# Patient Record
Sex: Male | Born: 1935
Health system: Southern US, Community
[De-identification: ages and names within clinical notes are randomized; demographics above are authoritative.]

## PROBLEM LIST (undated history)

## (undated) DIAGNOSIS — C801 Malignant (primary) neoplasm, unspecified: Secondary | ICD-10-CM

## (undated) DIAGNOSIS — M1990 Unspecified osteoarthritis, unspecified site: Secondary | ICD-10-CM

## (undated) DIAGNOSIS — I10 Essential (primary) hypertension: Secondary | ICD-10-CM

## (undated) DIAGNOSIS — C439 Malignant melanoma of skin, unspecified: Secondary | ICD-10-CM

## (undated) DIAGNOSIS — E785 Hyperlipidemia, unspecified: Secondary | ICD-10-CM

## (undated) DIAGNOSIS — N189 Chronic kidney disease, unspecified: Secondary | ICD-10-CM

## (undated) HISTORY — DX: Hyperlipidemia, unspecified: E78.5

## (undated) HISTORY — PX: CATARACT EXTRACTION: SUR2

## (undated) HISTORY — PX: TONSILLECTOMY: SUR1361

## (undated) HISTORY — DX: Malignant melanoma of skin, unspecified: C43.9

## (undated) HISTORY — PX: URETHRAL STRICTURE DILATATION: SHX477

## (undated) HISTORY — DX: Malignant (primary) neoplasm, unspecified: C80.1

## (undated) HISTORY — PX: COLON SURGERY: SHX602

## (undated) HISTORY — PX: OTHER SURGICAL HISTORY: SHX169

## (undated) HISTORY — DX: Essential (primary) hypertension: I10

---

## 2008-01-02 ENCOUNTER — Emergency Department (HOSPITAL_COMMUNITY): Admission: EM | Admit: 2008-01-02 | Discharge: 2008-01-02 | Payer: Self-pay | Admitting: Emergency Medicine

## 2010-10-17 LAB — URINALYSIS, ROUTINE W REFLEX MICROSCOPIC
Bilirubin Urine: NEGATIVE
Glucose, UA: NEGATIVE mg/dL
Hgb urine dipstick: NEGATIVE
Ketones, ur: 15 mg/dL — AB
Nitrite: NEGATIVE
Protein, ur: NEGATIVE mg/dL
Specific Gravity, Urine: 1.02 (ref 1.005–1.030)
Urobilinogen, UA: 1 mg/dL (ref 0.0–1.0)
pH: 8 (ref 5.0–8.0)

## 2010-10-17 LAB — CBC
HCT: 46.8 % (ref 39.0–52.0)
Hemoglobin: 15.9 g/dL (ref 13.0–17.0)
MCHC: 34 g/dL (ref 30.0–36.0)
MCV: 94 fL (ref 78.0–100.0)
Platelets: 163 10*3/uL (ref 150–400)
RBC: 4.97 MIL/uL (ref 4.22–5.81)
RDW: 13.3 % (ref 11.5–15.5)
WBC: 19.9 10*3/uL — ABNORMAL HIGH (ref 4.0–10.5)

## 2010-10-17 LAB — DIFFERENTIAL
Basophils Absolute: 0 10*3/uL (ref 0.0–0.1)
Basophils Relative: 0 % (ref 0–1)
Eosinophils Absolute: 0 10*3/uL (ref 0.0–0.7)
Eosinophils Relative: 0 % (ref 0–5)
Lymphocytes Relative: 4 % — ABNORMAL LOW (ref 12–46)
Lymphs Abs: 0.7 10*3/uL (ref 0.7–4.0)
Monocytes Absolute: 1.1 10*3/uL — ABNORMAL HIGH (ref 0.1–1.0)
Monocytes Relative: 6 % (ref 3–12)
Neutro Abs: 18 10*3/uL — ABNORMAL HIGH (ref 1.7–7.7)
Neutrophils Relative %: 91 % — ABNORMAL HIGH (ref 43–77)

## 2010-10-17 LAB — POCT I-STAT, CHEM 8
BUN: 24 mg/dL — ABNORMAL HIGH (ref 6–23)
Calcium, Ion: 1.09 mmol/L — ABNORMAL LOW (ref 1.12–1.32)
Chloride: 101 mEq/L (ref 96–112)
Creatinine, Ser: 1.9 mg/dL — ABNORMAL HIGH (ref 0.4–1.5)
Glucose, Bld: 115 mg/dL — ABNORMAL HIGH (ref 70–99)
HCT: 48 % (ref 39.0–52.0)
Hemoglobin: 16.3 g/dL (ref 13.0–17.0)
Potassium: 3.6 mEq/L (ref 3.5–5.1)
Sodium: 133 mEq/L — ABNORMAL LOW (ref 135–145)
TCO2: 24 mmol/L (ref 0–100)

## 2010-10-17 LAB — POCT URINALYSIS DIP (DEVICE)
Bilirubin Urine: NEGATIVE
Glucose, UA: NEGATIVE mg/dL
Hgb urine dipstick: NEGATIVE
Ketones, ur: NEGATIVE mg/dL
Nitrite: NEGATIVE
Protein, ur: 100 mg/dL — AB
Specific Gravity, Urine: 1.015 (ref 1.005–1.030)
Urobilinogen, UA: 1 mg/dL (ref 0.0–1.0)
pH: 7 (ref 5.0–8.0)

## 2015-01-17 DIAGNOSIS — Z961 Presence of intraocular lens: Secondary | ICD-10-CM | POA: Diagnosis not present

## 2015-02-06 DIAGNOSIS — L570 Actinic keratosis: Secondary | ICD-10-CM | POA: Diagnosis not present

## 2015-02-06 DIAGNOSIS — Z85820 Personal history of malignant melanoma of skin: Secondary | ICD-10-CM | POA: Diagnosis not present

## 2015-02-06 DIAGNOSIS — L812 Freckles: Secondary | ICD-10-CM | POA: Diagnosis not present

## 2015-02-06 DIAGNOSIS — L821 Other seborrheic keratosis: Secondary | ICD-10-CM | POA: Diagnosis not present

## 2015-02-27 ENCOUNTER — Ambulatory Visit (INDEPENDENT_AMBULATORY_CARE_PROVIDER_SITE_OTHER): Payer: PPO | Admitting: Family

## 2015-02-27 ENCOUNTER — Encounter: Payer: Self-pay | Admitting: Family

## 2015-02-27 ENCOUNTER — Telehealth: Payer: Self-pay | Admitting: General Practice

## 2015-02-27 VITALS — BP 120/76 | HR 103 | Temp 97.5°F | Resp 18 | Ht 66.0 in | Wt 176.0 lb

## 2015-02-27 DIAGNOSIS — S8391XA Sprain of unspecified site of right knee, initial encounter: Secondary | ICD-10-CM

## 2015-02-27 NOTE — Progress Notes (Signed)
Subjective:    Patient ID: Jesus Kim, male    DOB: Nov 21, 1924, 82 y.o.   MRN: [id number]  Chief Complaint  Patient presents with  . Knee Pain    mild pain for 2-3 years. Comes and goes. In the last year, sitting for long times causes hard time to git out of seat. Monday started to fall and caught himself, may have twisted knee. Start using walker this morning to walk.     HPI:  Jesus Kim is a 82 y.o. male who  has a past medical history of Hypertension; Hyperlipidemia; Cancer ([place]); and Melanoma ([place]). and presents today for an office visit.   This is a new problem. Associated symptom of pain located in his right knee has been going on for about 2-3 years with an exacerbation that occurred about 2 days ago when he tripped over a cord and started to fall and caught himself possibly twisting his knee in the process. He has been using the walker to help him ambulate. Modifying factors include a brace, Advil, which it seemed to help. Exacerbated by sitting for long periods of time and now when weight bearing. No sounds or sensations heard or felt.    Allergies  Allergen Reactions  . Sulfa Antibiotics Rash     No outpatient prescriptions prior to visit.   No facility-administered medications prior to visit.     Past Medical History  Diagnosis Date  . Hypertension   . Hyperlipidemia   . Cancer [facility])     Colon  . Melanoma [facility])      Past Surgical History  Procedure Laterality Date  . Urethral stricture dilatation    . Cataract extraction    . Left thub surgery       History reviewed. No pertinent family history.   Social History   Social History  . Marital Status: Married    Spouse Name: N/A  . Number of Children: 3  . Years of Education: 14   Occupational History  . Not on file.   Social History Main Topics  . Smoking status: Former Research scientist (life sciences)  . Smokeless tobacco: Never Used  . Alcohol Use: Yes  . Drug Use: No  . Sexual Activity: Not on file   Other  Topics Concern  . Not on file   Social History Narrative  . No narrative on file    Review of Systems  Constitutional: Negative for fever and chills.  Musculoskeletal:       Positive for right knee pain.   Neurological: Negative for weakness and numbness.      Objective:    BP 120/76 mmHg  Pulse 103  Temp(Src) 97.5 F (36.4 C) (Oral)  Resp 18  Ht 5\' 6"  (1.676 m)  Wt 176 lb (79.833 kg)  BMI 28.42 kg/m2  SpO2 96% Nursing note and vital signs reviewed.  Physical Exam  Constitutional: He is oriented to person, place, and time. He appears well-developed and well-nourished. No distress.  Cardiovascular: Normal rate, regular rhythm, normal heart sounds and intact distal pulses.   Pulmonary/Chest: Effort normal and breath sounds normal.  Musculoskeletal:  Right knee - mild medial joint line inflammation with no deformity or discoloration. Palpable tenderness of medial joint line and posterior medial joint line. Range of motion is normal with 4+ strength. Distal pulses and sensation are intact and appropriate. Negative anterior/posterior drawer, negative valgus/varus. Discomfort with weight bearing.   Neurological: He is alert and oriented to person, place, and time.  Skin: Skin is warm and dry.  Psychiatric: He has a normal mood and affect. His behavior is normal. Judgment and thought content normal.       Assessment & Plan:   Problem List Items Addressed This Visit      Musculoskeletal and Integument   Right knee sprain - Primary    Symptoms and exam consistent with medial knee sprain although cannot rule out potential of meniscal tear. Treat conservative with OTC ibuprofen, ice, and home exercise therapy. Follow up in 2-3 weeks if symptoms worsen or fail to improve for possible imaging if needed.

## 2015-02-27 NOTE — Patient Instructions (Signed)
Thank you for choosing [facility].  Summary/Instructions:  Ice 2-3 times per day or as needed for 20 minutes at a time. Elevate your leg when seated. Exercises daily. Ibuprofen 200-600 mg up to 3 times daily. Pennsaid - pinkie sized dose up to 2x per day.   If your symptoms worsen or fail to improve, please contact our office for further instruction, or in case of emergency go directly to the emergency room at the closest medical facility.   Medial Collateral Knee Ligament Sprain With Phase I Rehab The medial collateral ligament (MCL) of the knee helps hold the knee joint in proper alignment and prevents the bones from shifting out of alignment (displacing) to the inside (medially). Injury to the knee may cause a tear in the MCL ligament (sprain). Sprains may heal without treatment, but this often results in a loose joint. Sprains are classified into three categories. Grade 1 sprains cause pain, but the tendon is not lengthened. Grade 2 sprains include a lengthened ligament, due to the ligament being stretched or partially ruptured. With grade 2 sprains, there is still function, although possibly decreased. Grade 3 sprains involve a complete tear of the tendon or muscle, and function is usually impaired. SYMPTOMS   Pain and tenderness on the inner side of the knee.  A "pop," tearing or pulling sensation at the time of injury.  Bruising (contusion) at the site of injury, within 48 hours of injury.  Knee stiffness.  Limping, often walking with the knee bent. CAUSES  An MCL sprain occurs when a force is placed on the ligament that is greater than it can handle. Common mechanisms of injury include:  Direct hit (trauma) to the outer side of the knee, especially if the foot is planted on the ground.  Forceful pivoting of the body and leg, while the foot is planted on the ground. RISK INCREASES WITH:  Contact sports (football, rugby).  Sports that require pivoting or cutting  (soccer).  Poor knee strength and flexibility.  Improper equipment use. PREVENTION  Warm up and stretch properly before activity.  Maintain physical fitness:  Strength, flexibility and endurance.  Cardiovascular fitness.  Wear properly fitted protective equipment (correct length of cleats for surface).  Functional braces may be effective in preventing injury. PROGNOSIS  MCL tears usually heal without the need for surgery. Sometimes however, surgery is required. RELATED COMPLICATIONS  Frequently recurring symptoms, such as the knee giving way, knee instability or knee swelling.  Injury to other structures in the knee joint:  Meniscal cartilage, resulting in locking and swelling of the knee.  Articular cartilage, resulting in knee arthritis.  Other ligaments of the knee.  Injury to nerves, resulting in numbness of the outer leg, foot or ankle and weakness or paralysis, with inability to raise the ankle or toes.  Knee stiffness. TREATMENT Treatment first involves the use of ice and medicine, to reduce pain and inflammation. The use of strengthening and stretching exercises may help reduce pain with activity. These exercises may be performed at home, but referral to a therapist is often advised. You may be advised to walk with crutches until you are able to walk without a limp. Your caregiver may provide you with a hinged knee brace to help regain a full range of motion, while also protecting the injured knee. For severe MCL injuries or injuries that involve other ligaments of the knee, surgery is often advised. MEDICATION  Do not take pain medicine for 7 days before surgery.  Only use  over-the-counter pain medicine as directed by your caregiver.  Only use prescription pain relievers as directed and only in needed amounts. HEAT AND COLD  Cold treatment (icing) should be applied for 10 to 15 minutes every 2 to 3 hours for inflammation and pain, and immediately after any  activity, that aggravates the symptoms. Use ice packs or an ice massage.  Heat treatment may be used before performing stretching and strengthening activities prescribed by your caregiver, physical therapist or athletic trainer. Use a heat pack or warm water soak. SEEK MEDICAL CARE IF:   Symptoms get worse or do not improve in 4 to 6 weeks, despite treatment.  New, unexplained symptoms develop. EXERCISES  PHASE I EXERCISES  RANGE OF MOTION (ROM) AND STRETCHING EXERCISES-Medial Collateral Knee Ligament Sprain Phase I These are some of the initial exercises that your physician, physical therapist or athletic trainer may have you perform to begin your rehabilitation. When you demonstrate gains in your flexibility and strength, your caregiver may progress you to Phase II exercises. As you perform these exercises, remember:  These initial exercises are intended to be gentle. They will help you restore motion without increasing any swelling.  Completing these exercises allows less painful movement and prepares you for the more aggressive strengthening exercises in Phase II.  An effective stretch should be held for at least 30 seconds.  A stretch should never be painful. You should only feel a gentle lengthening or release in the stretched tissue. RANGE OF MOTION-Knee Flexion, Active  Lie on your back with both knees straight. (If this causes back discomfort, bend your healthy knee, placing your foot flat on the floor.)  Slowly slide your heel back toward your buttocks until you feel a gentle stretch in the front of your knee or thigh.  Hold for __________ seconds. Slowly slide your heel back to the starting position. Repeat __________ times. Complete this exercise __________ times per day. STRETCH-Knee Flexion, Supine  Lie on the floor with your right / left heel and foot lightly touching the wall. (Place both feet on the wall if you do not use a door frame.)  Without using any effort,  allow gravity to slide your foot down the wall slowly until you feel a gentle stretch in the front of your right / left knee.  Hold this stretch for __________ seconds. Then return the leg to the starting position, using your health leg for help, if needed. Repeat __________ times. Complete this stretch __________ times per day. RANGE OF MOTION-Knee Flexion and Extension, Active-Assisted  Sit on the edge of a table or chair with your thighs firmly supported. It may be helpful to place a folded towel under the end of your right / left thigh.  Flexion (bending): Place the ankle of your healthy leg on top of the other ankle. Use your healthy leg to gently bend your right / left knee until you feel a mild tension across the top of your knee.  Hold for __________ seconds.  Extension (straightening): Switch your ankles so your right / left leg is on top. Use your healthy leg to straighten your right / left knee until you feel a mild tension on the backside of your knee.  Hold for __________ seconds. Repeat __________ times. Complete this exercise __________ times per day. STRETCH-Knee Extension Sitting  Sit with your right / left leg/heel propped on another chair, coffee table, or foot stool.  Allow your leg muscles to relax, letting gravity straighten out your knee.*  You should feel a stretch behind your right / left knee. Hold this position for __________ seconds. Repeat __________ times. Complete this stretch __________ times per day. *Your physician, physical therapist or athletic trainer may instruct you to place a __________ weight on your thigh, just above your kneecap, to deepen the stretch. STRENGTHENING EXERCISES-Medial Collateral Knee Ligament Sprain Phase I These exercises may help you when beginning to rehabilitate your injury. They may resolve your symptoms with or without further involvement from your physician, physical therapist or athletic trainer. While completing these  exercises, remember:   In order to return to more demanding activities, you will likely need to progress to more challenging exercises. Your physician, physical therapist or athletic trainer will advance your exercises when your tissues show adequate healing and your muscles demonstrate increased strength.  Muscles can gain both the endurance and the strength needed for everyday activities through controlled exercises.  Complete these exercises as instructed by your physician, physical therapist or athletic trainer. Increase the resistance and repetitions only as guided by your caregiver. STRENGTH-Quadriceps, Isometrics  Lie on your back with your right / left leg extended and your opposite knee bent.  Gradually tense the muscles in the front of your right / left thigh. You should see either your kneecap slide up toward your hip or an increased dimpling just above the knee. This motion will push the back of the knee down toward the floor, mat or bed on which you are lying.  Hold the muscle as tight as you can without increasing your pain for __________ seconds.  Relax the muscles slowly and completely in between each repetition. Repeat __________ times. Complete this exercise __________ times per day. STRENGTH-Quadriceps, Short Arcs  Lie on your back. Place a __________ inch towel roll under your knee so that the knee slightly bends.  Raise only your lower leg by tightening the muscles in the front of your thigh. Do not allow your thigh to rise.  Hold this position for __________ seconds. Repeat __________ times. Complete this exercise __________ times per day. OPTIONAL ANKLE WEIGHTS: Begin with ____________________, but DO NOT exceed ____________________. Increase in 1 pound/0.5 kilogram increments.  STRENGTH--Quadriceps, Straight Leg Raises Quality counts! Watch for signs that the quadriceps muscle is working, to be sure you are strengthening the correct muscles and not "cheating" by  substituting with healthier muscles.  Lay on your back with your right / left leg extended and your opposite knee bent.  Tense the muscles in the front of your right / left thigh. You should see either your knee cap slide up or increased dimpling just above the knee. Your thigh may even shake a bit.  Tighten these muscles even more and raise your leg 4 to 6 inches off the floor. Hold for __________ seconds.  Keeping these muscles tense, lower your leg.  Relax the muscles slowly and completely in between each repetition. Repeat __________ times. Complete this exercise __________ times per day. STRENGTH-Hamstring, Isometrics  Lie on your back on a firm surface.  Bend your right / left knee approximately __________ degrees.  Dig your heel into the surface as if you are trying to pull it toward your buttocks. Tighten the muscles in the back of your thighs to "dig" as hard as you can, without increasing any pain.  Hold this position for __________ seconds.  Release the tension gradually and allow your muscle to completely relax for __________ seconds in between each exercise. Repeat __________ times. Complete this exercise __________  times per day. STRENGTH-Hamstring, Curls  Lay on your stomach with your legs extended. (If you lay on a bed, your feet may hang over the edge.)  Tighten the muscles in the back of your thigh to bend your right / left knee up to 90 degrees. Keep your hips flat on the bed.  Hold this position for __________ seconds.  Slowly lower your leg back to the starting position. Repeat __________ times. Complete this exercise __________ times per day. OPTIONAL ANKLE WEIGHTS: Begin with ____________________, but DO NOT exceed ____________________. Increase in 1 pound/0.5 kilogram increments.    This information is not intended to replace advice given to you by your health care provider. Make sure you discuss any questions you have with your health care provider.     Document Released: 12/29/2004 Document Revised: 01/19/2014 Document Reviewed: 04/12/2008 Elsevier Interactive Patient Education [employer].

## 2015-02-27 NOTE — Progress Notes (Signed)
Pre visit review using our clinic review tool, if applicable. No additional management support is needed unless otherwise documented below in the visit note. 

## 2015-02-27 NOTE — Assessment & Plan Note (Signed)
Symptoms and exam consistent with medial knee sprain although cannot rule out potential of meniscal tear. Treat conservative with OTC ibuprofen, ice, and home exercise therapy. Follow up in 2-3 weeks if symptoms worsen or fail to improve for possible imaging if needed.

## 2015-02-27 NOTE — Telephone Encounter (Signed)
States daughter see's Dr. Tamala Julian.  States he has done something to his knee and can not walk.  Would like to know if Dr. Tamala Julian can work him in.

## 2015-02-27 NOTE — Telephone Encounter (Signed)
Spoke to pt, explained to him that dr Tamala Julian did not have an opening today but Marya Amsler our NP did. Pt stated he would be fine seeing him. Scheduled pt with Terri Piedra @ 130p.

## 2015-03-13 DIAGNOSIS — Z0000 Encounter for general adult medical examination without abnormal findings: Secondary | ICD-10-CM | POA: Diagnosis not present

## 2015-03-13 DIAGNOSIS — R350 Frequency of micturition: Secondary | ICD-10-CM | POA: Diagnosis not present

## 2015-03-13 DIAGNOSIS — R338 Other retention of urine: Secondary | ICD-10-CM | POA: Diagnosis not present

## 2015-03-13 DIAGNOSIS — N390 Urinary tract infection, site not specified: Secondary | ICD-10-CM | POA: Diagnosis not present

## 2015-03-29 ENCOUNTER — Other Ambulatory Visit: Payer: Self-pay | Admitting: Urology

## 2015-03-29 DIAGNOSIS — Z0000 Encounter for general adult medical examination without abnormal findings: Secondary | ICD-10-CM | POA: Diagnosis not present

## 2015-03-29 DIAGNOSIS — R3912 Poor urinary stream: Secondary | ICD-10-CM | POA: Diagnosis not present

## 2015-03-29 DIAGNOSIS — N3020 Other chronic cystitis without hematuria: Secondary | ICD-10-CM | POA: Diagnosis not present

## 2015-03-29 DIAGNOSIS — R338 Other retention of urine: Secondary | ICD-10-CM | POA: Diagnosis not present

## 2015-03-29 NOTE — Patient Instructions (Addendum)
STEVYN MADDEN  03/29/2015   Your procedure is scheduled on: 04/02/2015    Report to [facility] Main  Entrance take [facility]  elevators to 3rd floor to  [place] at  12noon  Call this number if you have problems the morning of surgery [phone]   Remember: ONLY 1 PERSON MAY GO WITH YOU TO SHORT STAY TO GET  READY MORNING OF YOUR SURGERY.  Do not eat food after midnite .  May have clear liquids from 12 midnite until 0730am morning of surgery then nothing by mouth.       Take these medicines the morning of surgery with A SIP OF WATER: none                                 You may not have any metal on your body including hair pins and              piercings  Do not wear jewelry,  lotions, powders or perfumes, deodorant                 Men may shave face and neck.   Do not bring valuables to the hospital. [place].  Contacts, dentures or bridgework may not be worn into surgery. .     Patients discharged the day of surgery will not be allowed to drive home.  Name and phone number of your driver:  Special Instructions: coughing and deep breathing exercises, leg exercises               Please read over the following fact sheets you were given: _____________________________________________________________________             [facility] - Preparing for Surgery Before surgery, you can play an important role.  Because skin is not sterile, your skin needs to be as free of germs as possible.  You can reduce the number of germs on your skin by washing with CHG (chlorahexidine gluconate) soap before surgery.  CHG is an antiseptic cleaner which kills germs and bonds with the skin to continue killing germs even after washing. Please DO NOT use if you have an allergy to CHG or antibacterial soaps.  If your skin becomes reddened/irritated stop using the CHG and inform your nurse when you arrive at Short Stay. Do not  shave (including legs and underarms) for at least 48 hours prior to the first CHG shower.  You may shave your face/neck. Please follow these instructions carefully:  1.  Shower with CHG Soap the night before surgery and the  morning of Surgery.  2.  If you choose to wash your hair, wash your hair first as usual with your  normal  shampoo.  3.  After you shampoo, rinse your hair and body thoroughly to remove the  shampoo.                           4.  Use CHG as you would any other liquid soap.  You can apply chg directly  to the skin and wash  Gently with a scrungie or clean washcloth.  5.  Apply the CHG Soap to your body ONLY FROM THE NECK DOWN.   Do not use on face/ open                           Wound or open sores. Avoid contact with eyes, ears mouth and genitals (private parts).                       Wash face,  Genitals (private parts) with your normal soap.             6.  Wash thoroughly, paying special attention to the area where your surgery  will be performed.  7.  Thoroughly rinse your body with warm water from the neck down.  8.  DO NOT shower/wash with your normal soap after using and rinsing off  the CHG Soap.                9.  Pat yourself dry with a clean towel.            10.  Wear clean pajamas.            11.  Place clean sheets on your bed the night of your first shower and do not  sleep with pets. Day of Surgery : Do not apply any lotions/deodorants the morning of surgery.  Please wear clean clothes to the hospital/surgery center.  FAILURE TO FOLLOW THESE INSTRUCTIONS MAY RESULT IN THE CANCELLATION OF YOUR SURGERY PATIENT SIGNATURE_________________________________  NURSE SIGNATURE__________________________________  ________________________________________________________________________    CLEAR LIQUID DIET   Foods Allowed                                                                     Foods Excluded  Coffee and tea, regular and decaf                              liquids that you cannot  Plain Jell-O in any flavor                                             see through such as: Fruit ices (not with fruit pulp)                                     milk, soups, orange juice  Iced Popsicles                                    All solid food Carbonated beverages, regular and diet                                    Cranberry, grape and apple juices Sports drinks like Gatorade Lightly seasoned clear broth or consume(fat free) Sugar, honey syrup  Sample Menu Breakfast                                Lunch                                     Supper Cranberry juice                    Beef broth                            Chicken broth Jell-O                                     Grape juice                           Apple juice Coffee or tea                        Jell-O                                      Popsicle                                                Coffee or tea                        Coffee or tea  _____________________________________________________________________

## 2015-04-01 ENCOUNTER — Encounter (HOSPITAL_COMMUNITY): Payer: Self-pay

## 2015-04-01 ENCOUNTER — Encounter (HOSPITAL_COMMUNITY)
Admission: RE | Admit: 2015-04-01 | Discharge: 2015-04-01 | Disposition: A | Discharge status: Home / Self Care | Payer: PPO | Source: Ambulatory Visit | Attending: Urology | Admitting: Urology

## 2015-04-01 DIAGNOSIS — R338 Other retention of urine: Secondary | ICD-10-CM | POA: Diagnosis not present

## 2015-04-01 DIAGNOSIS — I129 Hypertensive chronic kidney disease with stage 1 through stage 4 chronic kidney disease, or unspecified chronic kidney disease: Secondary | ICD-10-CM | POA: Diagnosis not present

## 2015-04-01 DIAGNOSIS — N189 Chronic kidney disease, unspecified: Secondary | ICD-10-CM | POA: Diagnosis not present

## 2015-04-01 DIAGNOSIS — Z79899 Other long term (current) drug therapy: Secondary | ICD-10-CM | POA: Diagnosis not present

## 2015-04-01 DIAGNOSIS — E7800 Pure hypercholesterolemia, unspecified: Secondary | ICD-10-CM | POA: Diagnosis not present

## 2015-04-01 DIAGNOSIS — N359 Urethral stricture, unspecified: Secondary | ICD-10-CM | POA: Diagnosis not present

## 2015-04-01 DIAGNOSIS — Z87891 Personal history of nicotine dependence: Secondary | ICD-10-CM | POA: Diagnosis not present

## 2015-04-01 DIAGNOSIS — N3020 Other chronic cystitis without hematuria: Secondary | ICD-10-CM | POA: Diagnosis not present

## 2015-04-01 HISTORY — DX: Unspecified osteoarthritis, unspecified site: M19.90

## 2015-04-01 LAB — BASIC METABOLIC PANEL
Anion gap: 10 (ref 5–15)
BUN: 33 mg/dL — ABNORMAL HIGH (ref 6–20)
CO2: 24 mmol/L (ref 22–32)
Calcium: 9.2 mg/dL (ref 8.9–10.3)
Chloride: 105 mmol/L (ref 101–111)
Creatinine, Ser: 2.13 mg/dL — ABNORMAL HIGH (ref 0.61–1.24)
GFR calc Af Amer: 30 mL/min — ABNORMAL LOW (ref >60)
GFR calc non Af Amer: 26 mL/min — ABNORMAL LOW (ref >60)
Glucose, Bld: 88 mg/dL (ref 65–99)
Potassium: 4.9 mmol/L (ref 3.5–5.1)
Sodium: 139 mmol/L (ref 135–145)

## 2015-04-01 LAB — CBC
HCT: 46.9 % (ref 39.0–52.0)
Hemoglobin: 15.4 g/dL (ref 13.0–17.0)
MCH: 31.9 pg (ref 26.0–34.0)
MCHC: 32.8 g/dL (ref 30.0–36.0)
MCV: 97.1 fL (ref 78.0–100.0)
Platelets: 230 10*3/uL (ref 150–400)
RBC: 4.83 MIL/uL (ref 4.22–5.81)
RDW: 12.6 % (ref 11.5–15.5)
WBC: 9.9 10*3/uL (ref 4.0–10.5)

## 2015-04-01 NOTE — Progress Notes (Signed)
04-01-15 - BMP lab results from preop visit on 04-01-15 faxed to Dr. Matilde Sprang via [facility]

## 2015-04-01 NOTE — Progress Notes (Signed)
02-27-15 - LOV - G. Calone, [place] (int.med.) - EPIC

## 2015-04-01 NOTE — H&P (Signed)
History of Present Illness Jesus Kim had a ___ complex cyst. X-ray issues have been discussed. I spoke to radiology and Dr Jobe Igo recommended an MRI without contrast. It is better than a CT Scan without contrast, though often not definitive. Obviously following cysts by sequential ultrasounds is another option. His flow and frequency are improved and stable. I have him on daily trimethoprim.    When I saw Jesus Kim last time in August, he had stricture disease treated in [place] and had a stable flow on daily trimethoprim. In the past, he has not wanted an MRI. His residuals range from approximately 94-159 mL. He was doing well on trimethoprim. He has chronic renal insufficiency.   He saw Diane on 03/13/2015 with worsening voiding. He was on finasteride with the trimethoprim. His culture was negative. He was given RAPAFLO. He did not want a Foley catheter, and his residual 03/13/2015 was 348 mL.  Frequency: Stable.   [id number] [Recorded 03/29/15 11:35 AM EST by [email or web address]]     Past Medical History Problems   1. History of Colon Cancer  2. History of hypercholesterolemia (Z86.39)  3. History of hypertension (Z86.79)  Surgical History Problems   1. History of Colon Surgery  2. History of Hand Surgery  3. History of Urethra Surgery  Current Meds  1. Acetaminophen PM TABS;  Therapy: (Recorded:01Mar2017) to Recorded  2. Finasteride 5 MG Oral Tablet; TAKE 1 TABLET DAILY AS DIRECTED;  Therapy: [id number] to (Evaluate:17Jul2016)  Requested for: 18Apr2016; Last  Rx:18Apr2016 Ordered  3. HydroCHLOROthiazide 12.5 MG Oral Tablet;  Therapy: [id number] to Recorded  4. Lisinopril 20 MG Oral Tablet;  Therapy: [id number] to Recorded  5. Rapaflo 8 MG Oral Capsule; TAKE 1 CAPSULE Daily;  Therapy: [id number] to (Evaluate:08Mar2017); Last Rx:01Mar2017 Ordered  6. Simvastatin 20 MG Oral Tablet;  Therapy: (Recorded:25Apr2012) to Recorded  7. Trimethoprim 100 MG Oral Tablet; take 1 tablet by mouth once  daily;  Therapy: [id number] to (Evaluate:27Jul2017)  Requested for: 01Aug2016; Last  Rx:01Aug2016 Ordered  Allergies Medication   1. Sulfa Drugs  Family History Problems   1. Family history of Death In The Family Father : Father   FATHER PASSED @ AGE 21     HEART  2. Family history of Death In The Family Mother : Mother   MOTHER PASSED @ AGE 28     COLON  3. Family history of Family Health Status Number Of Children   3 DAUGHTERS  4. Family history of Hypertension : Mother  Social History Problems   1. Alcohol Use (History)   2 DRINKS DAILY  2. Caffeine Use   2 DRINKS DAILY  3. History of tobacco use (Z87.891)   SMOKED 1/4 PACKS DAILY     SMOKED FOR 30 YEARS     QUIT SMOKING 30 YEARS AGO  4. Marital History - Currently Married  5. 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 Other retention of urine (R33.8)  2. Weak urinary stream (R39.12)  Plan  Chronic cystitis   1. PVR U/S; Status:Complete;   Done: [id number]  2. URINE CULTURE; Status:In Progress - Specimen/Data Collected;   Done: [id number] Health Maintenance   3. UA With REFLEX; [Do Not Release]; Status:Resulted - Requires Verification;   Done:  [id number] 11:04AM  Discussion/Summary   Jesus Shockley likely has had recurrence of his urethral stricture. I talked to him about cystoscopy, retrograde urethrogram, and balloon dilation.  We talked about balloon dilation in detail. Pros, cons, general surgical and anesthetic risks, and other options including watchful waiting were discussed. He understands that dilation is  generally successful in most cases but long-term success rates are low. We talked about the risk of persistent, de novo, or worsening incontinence and voiding dysfunction. Risks were described but not limited to the discussion of injury to neighboring structures and soft tissues. Bleeding, infection, pain, erectile dysfunction, and spraying of urination were discussed. The risk of neuropathy was discussed as well as the usual post-operative course.  There were 440 mL in his bladder.  Jesus Lien is having some burning, and it may just be related to the stricture itself, but I thought it was best, with the weekend and not emptying well, to start him on an antibiotic. I think this should be done quite quickly in terms of dilation.  I will send in a prescription of ciprofloxacin. Urine was sent for culture.  After a thorough review of the management options for the patient's condition the patient  elected to proceed with surgical therapy as noted above. We have discussed the potential benefits and risks of the procedure, side effects of the proposed treatment, the likelihood of the patient achieving the goals of the procedure, and any potential problems that might occur during the procedure or recuperation. Informed consent has been obtained.

## 2015-04-02 ENCOUNTER — Ambulatory Visit (HOSPITAL_COMMUNITY): Payer: PPO | Admitting: Certified Registered Nurse Anesthetist

## 2015-04-02 ENCOUNTER — Ambulatory Visit (HOSPITAL_COMMUNITY): Payer: PPO

## 2015-04-02 ENCOUNTER — Ambulatory Visit (HOSPITAL_COMMUNITY)
Admission: RE | Admit: 2015-04-02 | Discharge: 2015-04-02 | Disposition: A | Discharge status: Home / Self Care | Payer: PPO | Source: Ambulatory Visit | Attending: Urology | Admitting: Urology

## 2015-04-02 ENCOUNTER — Encounter (HOSPITAL_COMMUNITY)
Admission: RE | Disposition: A | Discharge status: Home / Self Care | Payer: Self-pay | Source: Ambulatory Visit | Attending: Urology

## 2015-04-02 ENCOUNTER — Encounter (HOSPITAL_COMMUNITY): Payer: Self-pay | Admitting: *Deleted

## 2015-04-02 DIAGNOSIS — I129 Hypertensive chronic kidney disease with stage 1 through stage 4 chronic kidney disease, or unspecified chronic kidney disease: Secondary | ICD-10-CM | POA: Insufficient documentation

## 2015-04-02 DIAGNOSIS — N189 Chronic kidney disease, unspecified: Secondary | ICD-10-CM | POA: Insufficient documentation

## 2015-04-02 DIAGNOSIS — N359 Urethral stricture, unspecified: Secondary | ICD-10-CM | POA: Insufficient documentation

## 2015-04-02 DIAGNOSIS — Z4682 Encounter for fitting and adjustment of non-vascular catheter: Secondary | ICD-10-CM | POA: Diagnosis not present

## 2015-04-02 DIAGNOSIS — N3020 Other chronic cystitis without hematuria: Secondary | ICD-10-CM | POA: Diagnosis not present

## 2015-04-02 DIAGNOSIS — Z87891 Personal history of nicotine dependence: Secondary | ICD-10-CM | POA: Insufficient documentation

## 2015-04-02 DIAGNOSIS — Z79899 Other long term (current) drug therapy: Secondary | ICD-10-CM | POA: Insufficient documentation

## 2015-04-02 DIAGNOSIS — E7800 Pure hypercholesterolemia, unspecified: Secondary | ICD-10-CM | POA: Insufficient documentation

## 2015-04-02 DIAGNOSIS — Z419 Encounter for procedure for purposes other than remedying health state, unspecified: Secondary | ICD-10-CM

## 2015-04-02 HISTORY — PX: CYSTOSCOPY WITH RETROGRADE PYELOGRAM, URETEROSCOPY AND STENT PLACEMENT: SHX5789

## 2015-04-02 HISTORY — PX: BALLOON DILATION: SHX5330

## 2015-04-02 SURGERY — CYSTOURETEROSCOPY, WITH RETROGRADE PYELOGRAM AND STENT INSERTION
Anesthesia: General

## 2015-04-02 MED ORDER — CIPROFLOXACIN IN D5W 400 MG/200ML IV SOLN
INTRAVENOUS | Status: AC
  Filled 2015-04-02: qty 200

## 2015-04-02 MED ORDER — CIPROFLOXACIN IN D5W 400 MG/200ML IV SOLN
400.0000 mg | INTRAVENOUS | Status: AC
  Administered 2015-04-02: 400 mg via INTRAVENOUS

## 2015-04-02 MED ORDER — ONDANSETRON HCL 4 MG/2ML IJ SOLN
INTRAMUSCULAR | Status: AC
  Filled 2015-04-02: qty 2

## 2015-04-02 MED ORDER — PROPOFOL 10 MG/ML IV BOLUS
INTRAVENOUS | Status: AC
  Filled 2015-04-02: qty 20

## 2015-04-02 MED ORDER — FENTANYL CITRATE (PF) 100 MCG/2ML IJ SOLN
INTRAMUSCULAR | Status: AC
  Filled 2015-04-02: qty 2

## 2015-04-02 MED ORDER — FENTANYL CITRATE (PF) 100 MCG/2ML IJ SOLN
INTRAMUSCULAR | Status: DC | PRN
  Administered 2015-04-02: 25 ug via INTRAVENOUS
  Administered 2015-04-02: 50 ug via INTRAVENOUS
  Administered 2015-04-02: 25 ug via INTRAVENOUS

## 2015-04-02 MED ORDER — ONDANSETRON HCL 4 MG/2ML IJ SOLN
INTRAMUSCULAR | Status: DC | PRN
  Administered 2015-04-02: 4 mg via INTRAVENOUS

## 2015-04-02 MED ORDER — HYDROCODONE-ACETAMINOPHEN 5-325 MG PO TABS: 1.0000 | ORAL_TABLET | Freq: Four times a day (QID) | ORAL | Status: DC | PRN

## 2015-04-02 MED ORDER — PROPOFOL 10 MG/ML IV BOLUS
INTRAVENOUS | Status: DC | PRN
  Administered 2015-04-02: 150 mg via INTRAVENOUS

## 2015-04-02 MED ORDER — LACTATED RINGERS IV SOLN
INTRAVENOUS | Status: DC
  Administered 2015-04-02: 1000 mL via INTRAVENOUS

## 2015-04-02 MED ORDER — PHENYLEPHRINE HCL 10 MG/ML IJ SOLN
INTRAMUSCULAR | Status: DC | PRN
  Administered 2015-04-02: 80 ug via INTRAVENOUS

## 2015-04-02 MED ORDER — EPHEDRINE SULFATE 50 MG/ML IJ SOLN
INTRAMUSCULAR | Status: DC | PRN
  Administered 2015-04-02: 10 mg via INTRAVENOUS

## 2015-04-02 MED ORDER — LIDOCAINE HCL (CARDIAC) 20 MG/ML IV SOLN
INTRAVENOUS | Status: AC
  Filled 2015-04-02: qty 5

## 2015-04-02 MED ORDER — LIDOCAINE HCL (CARDIAC) 20 MG/ML IV SOLN
INTRAVENOUS | Status: DC | PRN
  Administered 2015-04-02: 100 mg via INTRAVENOUS

## 2015-04-02 SURGICAL SUPPLY — 12 items
BAG URO CATCHER STRL LF (MISCELLANEOUS) ×2 IMPLANT
BALLN NEPHROSTOMY (BALLOONS) ×2
BALLOON NEPHROSTOMY (BALLOONS) IMPLANT
CATH FOLEY 2W COUNCIL 5CC 16FR (CATHETERS) ×1 IMPLANT
CATH INTERMIT  6FR 70CM (CATHETERS) ×2 IMPLANT
CLOTH BEACON ORANGE TIMEOUT ST (SAFETY) ×2 IMPLANT
GLOVE BIOGEL M STRL SZ7.5 (GLOVE) ×2 IMPLANT
GOWN STRL REUS W/TWL LRG LVL3 (GOWN DISPOSABLE) ×4 IMPLANT
GUIDEWIRE STR DUAL SENSOR (WIRE) ×2 IMPLANT
MANIFOLD NEPTUNE II (INSTRUMENTS) ×2 IMPLANT
PACK CYSTO (CUSTOM PROCEDURE TRAY) ×2 IMPLANT
TUBING CONNECTING 10 (TUBING) IMPLANT

## 2015-04-02 NOTE — Anesthesia Preprocedure Evaluation (Addendum)
Anesthesia Evaluation  Patient identified by MRN, date of birth, ID band Patient awake    Reviewed: Allergy & Precautions, NPO status , Patient's Chart, lab work & pertinent test results  Airway Mallampati: I  TM Distance: >3 FB Neck ROM: Full    Dental   Pulmonary former smoker,    Pulmonary exam normal        Cardiovascular hypertension, Pt. on medications Normal cardiovascular exam     Neuro/Psych    GI/Hepatic   Endo/Other    Renal/GU      Musculoskeletal   Abdominal   Peds  Hematology   Anesthesia Other Findings   Reproductive/Obstetrics                            Anesthesia Physical Anesthesia Plan  ASA: II  Anesthesia Plan: General   Post-op Pain Management:    Induction: Intravenous  Airway Management Planned: LMA  Additional Equipment:   Intra-op Plan:   Post-operative Plan: Extubation in OR  Informed Consent: I have reviewed the patients History and Physical, chart, labs and discussed the procedure including the risks, benefits and alternatives for the proposed anesthesia with the patient or authorized representative who has indicated his/her understanding and acceptance.     Plan Discussed with: CRNA and Surgeon  Anesthesia Plan Comments:        Anesthesia Quick Evaluation

## 2015-04-02 NOTE — Op Note (Signed)
Preoperative diagnosis: Urethral stricture Postoperative diagnosis: Urethral stricture Surgery: Cystoscopy and retrograde urethrogram and balloon dilation of stricture Surgeon: Dr. Nicki Reaper Shavonta Gossen  The patient has the above diagnoses and consented above procedure. Extra care was taken with leg positioning  Preoperative antibiotics were given.  Utilizing cut IV tubing I did a retrograde urethrogram of the penis on oblique stretch the patient's left in the AP view. Approximate 10 mL of contrast was utilized. Urethra looked almost normal caliber though it didn't reduce throughout. Dye did reach the bladder.  I then cystoscoped the patient had a 4 [place] stricture 1 inch proximal to the urethral meatus.  Under fluoroscopic and cystoscopic guidance a sensor wire was passed curling in the bladder. Under fluoroscopic guidance the balloon dilation catheter well-prepared was delivered just distal to the membranous urethra. It was filled with 18 atm of pressure and held for 5 minutes. It was emptied and removed.  A 17 French cystoscope was utilized. He had diffuse stricture disease throughout the bulbar and membranous urethra starting 1 cm distal to the sphincter. The caliber was probably 22 [place] in size. The obstructing stricture I believe was the distal one already mentioned.  He had bilobar enlargement of the prostatic urethra. He had a little bit of a ski jump affect at the bladder neck. He had a long prostatic urethra. The bladder mucosa and trigone were normal.  Over the wire a 16 French [place] catheter was easily inserted. I'll leave it in for approximately 3 days.

## 2015-04-02 NOTE — Interval H&P Note (Signed)
History and Physical Interval Note:  04/02/2015 12:09 PM  Jesus Kim  has presented today for surgery, with the diagnosis of URETHRAL STRICTURE  The various methods of treatment have been discussed with the patient and family. After consideration of risks, benefits and other options for treatment, the patient has consented to  Procedure(s): CYSTO WITH RETROGRADE  (N/A) BALLOON DILATION (N/A) as a surgical intervention .  The patient's history has been reviewed, patient examined, no change in status, stable for surgery.  I have reviewed the patient's chart and labs.  Questions were answered to the patient's satisfaction.     Turner Baillie A

## 2015-04-02 NOTE — Transfer of Care (Signed)
Immediate Anesthesia Transfer of Care Note  Patient: Jesus Kim  Procedure(s) Performed: Procedure(s): CYSTO WITH RETROGRADE  (N/A) BALLOON DILATION (N/A)  Patient Location: PACU  Anesthesia Type:General  Level of Consciousness:  sedated, patient cooperative and responds to stimulation  Airway & Oxygen Therapy:Patient Spontanous Breathing and Patient connected to face mask oxgen  Post-op Assessment:  Report given to PACU RN and Post -op Vital signs reviewed and stable  Post vital signs:  Reviewed and stable  Last Vitals:  Filed Vitals:   04/02/15 1100  BP: 130/70  Pulse: 97  Temp: 36.7 C  Resp: 18    Complications: No apparent anesthesia complications

## 2015-04-02 NOTE — Anesthesia Postprocedure Evaluation (Signed)
Anesthesia Post Note  Patient: Jesus Kim  Procedure(s) Performed: Procedure(s) (LRB): CYSTO WITH RETROGRADE  (N/A) BALLOON DILATION (N/A)  Patient location during evaluation: PACU Anesthesia Type: General Level of consciousness: awake and alert Pain management: pain level controlled Vital Signs Assessment: post-procedure vital signs reviewed and stable Respiratory status: spontaneous breathing, nonlabored ventilation, respiratory function stable and patient connected to nasal cannula oxygen Cardiovascular status: blood pressure returned to baseline and stable Postop Assessment: no signs of nausea or vomiting Anesthetic complications: no    Last Vitals:  Filed Vitals:   04/02/15 1700 04/02/15 1714  BP: 104/63 117/56  Pulse: 85 82  Temp: 36.4 C 36.3 C  Resp: 20 16    Last Pain:  Filed Vitals:   04/02/15 1719  PainSc: 1                  Camille Dragan DAVID

## 2015-04-02 NOTE — Discharge Instructions (Signed)
Foley Catheter Care, Adult    A Foley catheter is a soft, flexible tube. This tube is placed into your bladder to drain pee (urine). If you go home with this catheter in place, follow the instructions below.  TAKING CARE OF THE CATHETER  1. Wash your hands with soap and water.  2. Put soap and water on a clean washcloth.  ¨ Clean the skin where the tube goes into your body.  § Clean away from the tube site.  § Never wipe toward the tube.  § Clean the area using a circular motion.  ¨ Remove all the soap. Pat the area dry with a clean towel. For males, reposition the skin that covers the end of the penis (foreskin).  3. Attach the tube to your leg with tape or a leg strap. Do not stretch the tube tight. If you are using tape, remove any stickiness left behind by past tape you used.  4. Keep the drainage bag below your hips. Keep it off the floor.  5. Check your tube during the day. Make sure it is working and draining. Make sure the tube does not curl, twist, or bend.  6. Do not pull on the tube or try to take it out.  TAKING CARE OF THE DRAINAGE BAGS    You will have a large overnight drainage bag and a small leg bag. You may wear the overnight bag any time. Never wear the small bag at night. Follow the directions below.  Emptying the Drainage Bag  Empty your drainage bag when it is ?-½ full or at least 2-3 times a day.  1. Wash your hands with soap and water.  2. Keep the drainage bag below your hips.  3. Hold the dirty bag over the toilet or clean container.  4. Open the pour spout at the bottom of the bag. Empty the pee into the toilet or container. Do not let the pour spout touch anything.  5. Clean the pour spout with a gauze pad or cotton ball that has rubbing alcohol on it.  6. Close the pour spout.  7. Attach the bag to your leg with tape or a leg strap.  8. Wash your hands well.  Changing the Drainage Bag  Change your bag once a month or sooner if it starts to smell or look dirty.   1. Wash your hands with  soap and water.  2. Pinch the rubber tube so that pee does not spill out.  3. Disconnect the catheter tube from the drainage tube at the connection valve. Do not let the tubes touch anything.  4. Clean the end of the catheter tube with an alcohol wipe. Clean the end of a the drainage tube with a different alcohol wipe.  5. Connect the catheter tube to the drainage tube of the clean drainage bag.  6. Attach the new bag to the leg with tape or a leg strap. Avoid attaching the new bag too tightly.  7. Wash your hands well.  Cleaning the Drainage Bag  1. Wash your hands with soap and water.  2. Wash the bag in warm, soapy water.  3. Rinse the bag with warm water.  4. Fill the bag with a mixture of white vinegar and water (1 cup vinegar to 1 quart warm water [.2 liter vinegar to 1 liter warm water]). Close the bag and soak it for 30 minutes in the solution.  5. Rinse the bag with warm water.  6.   Hang the bag to dry with the pour spout open and hanging downward.  7. Store the clean bag (once it is dry) in a clean plastic bag.  8. Wash your hands well.  PREVENT INFECTION  · Wash your hands before and after touching your tube.  · Take showers every day. Wash the skin where the tube enters your body. Do not take baths. Replace wet leg straps with dry ones, if this applies.  · Do not use powders, sprays, or lotions on the genital area. Only use creams, lotions, or ointments as told by your doctor.  · For females, wipe from front to back after going to the bathroom.  · Drink enough fluids to keep your pee clear or pale yellow unless you are told not to have too much fluid (fluid restriction).  · Do not let the drainage bag or tubing touch or lie on the floor.  · Wear cotton underwear to keep the area dry.  GET HELP IF:  · Your pee is cloudy or smells unusually bad.  · Your tube becomes clogged.  · You are not draining pee into the bag or your bladder feels full.  · Your tube starts to leak.  GET HELP RIGHT AWAY IF:  · You have  pain, puffiness (swelling), redness, or yellowish-white fluid (pus) where the tube enters the body.  · You have pain in the belly (abdomen), legs, lower back, or bladder.  · You have a fever.  · You see blood fill the tube, or your pee is pink or red.  · You feel sick to your stomach (nauseous), throw up (vomit), or have chills.  · Your tube gets pulled out.  MAKE SURE YOU:   · Understand these instructions.  · Will watch your condition.  · Will get help right away if you are not doing well or get worse.     This information is not intended to replace advice given to you by your health care provider. Make sure you discuss any questions you have with your health care provider.     Document Released: 04/25/2012 Document Revised: 01/19/2014 Document Reviewed: 04/25/2012  [employer]vier Interactive Patient Education ©2016 [employer]   

## 2015-04-02 NOTE — Progress Notes (Signed)
Ambulated to BR with minimal assist to demonstrate how to empty foley in toilet. Pt is dribbling small amouts of blood from penis. Assisted back to bed no active bleeding noted and urine is a very light pink. VSS and patient is warm and dry color pink Dr Matilde Sprang was notified of this and this was not unexpected. Pt is eager to go home. Peripad given to patient with instructions that office would call him tomorrow and he could call if he has concerns

## 2015-04-02 NOTE — Anesthesia Procedure Notes (Signed)
Procedure Name: LMA Insertion Date/Time: 04/02/2015 3:39 PM Performed by: Maxwell Caul Pre-anesthesia Checklist: Patient identified, Emergency Drugs available, Suction available, Patient being monitored and Timeout performed Patient Re-evaluated:Patient Re-evaluated prior to inductionOxygen Delivery Method: Circle system utilized Preoxygenation: Pre-oxygenation with 100% oxygen Intubation Type: IV induction LMA: LMA inserted LMA Size: 5.0 Number of attempts: 1 Placement Confirmation: positive ETCO2 and breath sounds checked- equal and bilateral Tube secured with: Tape Dental Injury: Teeth and Oropharynx as per pre-operative assessment

## 2015-04-02 NOTE — Progress Notes (Signed)
Stood at bedside to dress. No further dripping of blood from penis. Foley attached to leg strap and urine is only very slight pink.

## 2015-04-03 ENCOUNTER — Encounter (HOSPITAL_COMMUNITY): Payer: Self-pay | Admitting: Urology

## 2015-04-05 DIAGNOSIS — R338 Other retention of urine: Secondary | ICD-10-CM | POA: Diagnosis not present

## 2015-06-24 DIAGNOSIS — R7301 Impaired fasting glucose: Secondary | ICD-10-CM | POA: Diagnosis not present

## 2015-06-24 DIAGNOSIS — I10 Essential (primary) hypertension: Secondary | ICD-10-CM | POA: Diagnosis not present

## 2015-06-24 DIAGNOSIS — N183 Chronic kidney disease, stage 3 (moderate): Secondary | ICD-10-CM | POA: Diagnosis not present

## 2015-08-15 DIAGNOSIS — N3020 Other chronic cystitis without hematuria: Secondary | ICD-10-CM | POA: Diagnosis not present

## 2015-10-02 DIAGNOSIS — N359 Urethral stricture, unspecified: Secondary | ICD-10-CM | POA: Diagnosis not present

## 2015-10-02 DIAGNOSIS — N183 Chronic kidney disease, stage 3 (moderate): Secondary | ICD-10-CM | POA: Diagnosis not present

## 2015-10-23 DIAGNOSIS — Z23 Encounter for immunization: Secondary | ICD-10-CM | POA: Diagnosis not present

## 2015-10-23 DIAGNOSIS — Z961 Presence of intraocular lens: Secondary | ICD-10-CM | POA: Diagnosis not present

## 2015-10-23 DIAGNOSIS — H353122 Nonexudative age-related macular degeneration, left eye, intermediate dry stage: Secondary | ICD-10-CM | POA: Diagnosis not present

## 2015-10-23 DIAGNOSIS — H353112 Nonexudative age-related macular degeneration, right eye, intermediate dry stage: Secondary | ICD-10-CM | POA: Diagnosis not present

## 2015-10-23 DIAGNOSIS — H524 Presbyopia: Secondary | ICD-10-CM | POA: Diagnosis not present

## 2015-10-24 DIAGNOSIS — H353132 Nonexudative age-related macular degeneration, bilateral, intermediate dry stage: Secondary | ICD-10-CM | POA: Diagnosis not present

## 2015-12-13 DIAGNOSIS — B9789 Other viral agents as the cause of diseases classified elsewhere: Secondary | ICD-10-CM | POA: Diagnosis not present

## 2015-12-13 DIAGNOSIS — J069 Acute upper respiratory infection, unspecified: Secondary | ICD-10-CM | POA: Diagnosis not present

## 2015-12-16 DIAGNOSIS — J209 Acute bronchitis, unspecified: Secondary | ICD-10-CM | POA: Diagnosis not present

## 2015-12-25 DIAGNOSIS — N183 Chronic kidney disease, stage 3 (moderate): Secondary | ICD-10-CM | POA: Diagnosis not present

## 2015-12-25 DIAGNOSIS — E785 Hyperlipidemia, unspecified: Secondary | ICD-10-CM | POA: Diagnosis not present

## 2015-12-25 DIAGNOSIS — I10 Essential (primary) hypertension: Secondary | ICD-10-CM | POA: Diagnosis not present

## 2015-12-25 DIAGNOSIS — R739 Hyperglycemia, unspecified: Secondary | ICD-10-CM | POA: Diagnosis not present

## 2016-01-05 DIAGNOSIS — R05 Cough: Secondary | ICD-10-CM | POA: Diagnosis not present

## 2016-02-05 DIAGNOSIS — C44319 Basal cell carcinoma of skin of other parts of face: Secondary | ICD-10-CM | POA: Diagnosis not present

## 2016-02-05 DIAGNOSIS — D485 Neoplasm of uncertain behavior of skin: Secondary | ICD-10-CM | POA: Diagnosis not present

## 2016-02-05 DIAGNOSIS — D235 Other benign neoplasm of skin of trunk: Secondary | ICD-10-CM | POA: Diagnosis not present

## 2016-02-05 DIAGNOSIS — L570 Actinic keratosis: Secondary | ICD-10-CM | POA: Diagnosis not present

## 2016-02-05 DIAGNOSIS — L814 Other melanin hyperpigmentation: Secondary | ICD-10-CM | POA: Diagnosis not present

## 2016-02-05 DIAGNOSIS — L821 Other seborrheic keratosis: Secondary | ICD-10-CM | POA: Diagnosis not present

## 2016-02-05 DIAGNOSIS — D2271 Melanocytic nevi of right lower limb, including hip: Secondary | ICD-10-CM | POA: Diagnosis not present

## 2016-02-05 DIAGNOSIS — Z85820 Personal history of malignant melanoma of skin: Secondary | ICD-10-CM | POA: Diagnosis not present

## 2016-02-19 DIAGNOSIS — Z85828 Personal history of other malignant neoplasm of skin: Secondary | ICD-10-CM | POA: Diagnosis not present

## 2016-02-19 DIAGNOSIS — L905 Scar conditions and fibrosis of skin: Secondary | ICD-10-CM | POA: Diagnosis not present

## 2016-02-19 DIAGNOSIS — L988 Other specified disorders of the skin and subcutaneous tissue: Secondary | ICD-10-CM | POA: Diagnosis not present

## 2016-02-19 DIAGNOSIS — D485 Neoplasm of uncertain behavior of skin: Secondary | ICD-10-CM | POA: Diagnosis not present

## 2016-02-26 DIAGNOSIS — J111 Influenza due to unidentified influenza virus with other respiratory manifestations: Secondary | ICD-10-CM | POA: Diagnosis not present

## 2016-02-26 DIAGNOSIS — R509 Fever, unspecified: Secondary | ICD-10-CM | POA: Diagnosis not present

## 2016-04-01 DIAGNOSIS — L905 Scar conditions and fibrosis of skin: Secondary | ICD-10-CM | POA: Diagnosis not present

## 2016-04-01 DIAGNOSIS — Z85828 Personal history of other malignant neoplasm of skin: Secondary | ICD-10-CM | POA: Diagnosis not present

## 2016-04-22 DIAGNOSIS — H353131 Nonexudative age-related macular degeneration, bilateral, early dry stage: Secondary | ICD-10-CM | POA: Diagnosis not present

## 2016-05-28 DIAGNOSIS — J069 Acute upper respiratory infection, unspecified: Secondary | ICD-10-CM | POA: Diagnosis not present

## 2016-05-28 DIAGNOSIS — R110 Nausea: Secondary | ICD-10-CM | POA: Diagnosis not present

## 2016-06-24 DIAGNOSIS — E785 Hyperlipidemia, unspecified: Secondary | ICD-10-CM | POA: Diagnosis not present

## 2016-06-24 DIAGNOSIS — N400 Enlarged prostate without lower urinary tract symptoms: Secondary | ICD-10-CM | POA: Diagnosis not present

## 2016-06-24 DIAGNOSIS — I10 Essential (primary) hypertension: Secondary | ICD-10-CM | POA: Diagnosis not present

## 2016-06-24 DIAGNOSIS — R7303 Prediabetes: Secondary | ICD-10-CM | POA: Diagnosis not present

## 2016-06-24 DIAGNOSIS — N189 Chronic kidney disease, unspecified: Secondary | ICD-10-CM | POA: Diagnosis not present

## 2016-07-01 DIAGNOSIS — N359 Urethral stricture, unspecified: Secondary | ICD-10-CM | POA: Diagnosis not present

## 2016-07-01 DIAGNOSIS — N183 Chronic kidney disease, stage 3 (moderate): Secondary | ICD-10-CM | POA: Diagnosis not present

## 2016-07-16 DIAGNOSIS — N183 Chronic kidney disease, stage 3 (moderate): Secondary | ICD-10-CM | POA: Diagnosis not present

## 2016-07-22 DIAGNOSIS — N3020 Other chronic cystitis without hematuria: Secondary | ICD-10-CM | POA: Diagnosis not present

## 2016-07-22 DIAGNOSIS — R350 Frequency of micturition: Secondary | ICD-10-CM | POA: Diagnosis not present

## 2016-07-30 DIAGNOSIS — Z85820 Personal history of malignant melanoma of skin: Secondary | ICD-10-CM | POA: Diagnosis not present

## 2016-07-30 DIAGNOSIS — Z85828 Personal history of other malignant neoplasm of skin: Secondary | ICD-10-CM | POA: Diagnosis not present

## 2016-07-30 DIAGNOSIS — L570 Actinic keratosis: Secondary | ICD-10-CM | POA: Diagnosis not present

## 2016-07-30 DIAGNOSIS — L905 Scar conditions and fibrosis of skin: Secondary | ICD-10-CM | POA: Diagnosis not present

## 2016-07-30 DIAGNOSIS — C44319 Basal cell carcinoma of skin of other parts of face: Secondary | ICD-10-CM | POA: Diagnosis not present

## 2016-09-02 DIAGNOSIS — R351 Nocturia: Secondary | ICD-10-CM | POA: Diagnosis not present

## 2016-09-02 DIAGNOSIS — R350 Frequency of micturition: Secondary | ICD-10-CM | POA: Diagnosis not present

## 2016-09-21 DIAGNOSIS — L905 Scar conditions and fibrosis of skin: Secondary | ICD-10-CM | POA: Diagnosis not present

## 2016-09-21 DIAGNOSIS — Z85828 Personal history of other malignant neoplasm of skin: Secondary | ICD-10-CM | POA: Diagnosis not present

## 2016-10-07 DIAGNOSIS — Z6827 Body mass index (BMI) 27.0-27.9, adult: Secondary | ICD-10-CM | POA: Diagnosis not present

## 2016-10-07 DIAGNOSIS — N3289 Other specified disorders of bladder: Secondary | ICD-10-CM | POA: Diagnosis not present

## 2016-10-07 DIAGNOSIS — N184 Chronic kidney disease, stage 4 (severe): Secondary | ICD-10-CM | POA: Diagnosis not present

## 2016-10-07 DIAGNOSIS — Z23 Encounter for immunization: Secondary | ICD-10-CM | POA: Diagnosis not present

## 2016-10-07 DIAGNOSIS — N359 Urethral stricture, unspecified: Secondary | ICD-10-CM | POA: Diagnosis not present

## 2016-10-09 DIAGNOSIS — N184 Chronic kidney disease, stage 4 (severe): Secondary | ICD-10-CM | POA: Diagnosis not present

## 2016-10-28 DIAGNOSIS — H353131 Nonexudative age-related macular degeneration, bilateral, early dry stage: Secondary | ICD-10-CM | POA: Diagnosis not present

## 2016-10-28 DIAGNOSIS — H524 Presbyopia: Secondary | ICD-10-CM | POA: Diagnosis not present

## 2016-10-28 DIAGNOSIS — Z961 Presence of intraocular lens: Secondary | ICD-10-CM | POA: Diagnosis not present

## 2016-12-16 ENCOUNTER — Other Ambulatory Visit: Payer: Self-pay | Admitting: Family Medicine

## 2016-12-16 ENCOUNTER — Ambulatory Visit
Admission: RE | Admit: 2016-12-16 | Discharge: 2016-12-16 | Disposition: A | Discharge status: Home / Self Care | Payer: PPO | Source: Ambulatory Visit | Attending: Family Medicine | Admitting: Family Medicine

## 2016-12-16 DIAGNOSIS — I10 Essential (primary) hypertension: Secondary | ICD-10-CM | POA: Diagnosis not present

## 2016-12-16 DIAGNOSIS — R059 Cough, unspecified: Secondary | ICD-10-CM

## 2016-12-16 DIAGNOSIS — N189 Chronic kidney disease, unspecified: Secondary | ICD-10-CM | POA: Diagnosis not present

## 2016-12-16 DIAGNOSIS — R05 Cough: Secondary | ICD-10-CM

## 2016-12-16 DIAGNOSIS — R7303 Prediabetes: Secondary | ICD-10-CM | POA: Diagnosis not present

## 2016-12-16 DIAGNOSIS — E785 Hyperlipidemia, unspecified: Secondary | ICD-10-CM | POA: Diagnosis not present

## 2017-01-13 DIAGNOSIS — D649 Anemia, unspecified: Secondary | ICD-10-CM | POA: Diagnosis not present

## 2017-03-01 ENCOUNTER — Emergency Department (HOSPITAL_COMMUNITY)
Admission: EM | Admit: 2017-03-01 | Discharge: 2017-03-01 | Disposition: A | Discharge status: Home / Self Care | Payer: PPO | Attending: Emergency Medicine | Admitting: Emergency Medicine

## 2017-03-01 ENCOUNTER — Emergency Department (HOSPITAL_COMMUNITY): Payer: PPO

## 2017-03-01 ENCOUNTER — Encounter (HOSPITAL_COMMUNITY): Payer: Self-pay | Admitting: Emergency Medicine

## 2017-03-01 DIAGNOSIS — N189 Chronic kidney disease, unspecified: Secondary | ICD-10-CM

## 2017-03-01 DIAGNOSIS — N50811 Right testicular pain: Secondary | ICD-10-CM | POA: Diagnosis present

## 2017-03-01 DIAGNOSIS — Z85038 Personal history of other malignant neoplasm of large intestine: Secondary | ICD-10-CM | POA: Diagnosis not present

## 2017-03-01 DIAGNOSIS — L02214 Cutaneous abscess of groin: Secondary | ICD-10-CM | POA: Diagnosis not present

## 2017-03-01 DIAGNOSIS — N451 Epididymitis: Secondary | ICD-10-CM

## 2017-03-01 DIAGNOSIS — I861 Scrotal varices: Secondary | ICD-10-CM | POA: Diagnosis not present

## 2017-03-01 DIAGNOSIS — I129 Hypertensive chronic kidney disease with stage 1 through stage 4 chronic kidney disease, or unspecified chronic kidney disease: Secondary | ICD-10-CM | POA: Diagnosis not present

## 2017-03-01 DIAGNOSIS — I120 Hypertensive chronic kidney disease with stage 5 chronic kidney disease or end stage renal disease: Secondary | ICD-10-CM | POA: Diagnosis not present

## 2017-03-01 DIAGNOSIS — N50819 Testicular pain, unspecified: Secondary | ICD-10-CM

## 2017-03-01 DIAGNOSIS — Z79899 Other long term (current) drug therapy: Secondary | ICD-10-CM | POA: Diagnosis not present

## 2017-03-01 DIAGNOSIS — Z87891 Personal history of nicotine dependence: Secondary | ICD-10-CM | POA: Diagnosis not present

## 2017-03-01 DIAGNOSIS — N3001 Acute cystitis with hematuria: Secondary | ICD-10-CM | POA: Insufficient documentation

## 2017-03-01 LAB — I-STAT CG4 LACTIC ACID, ED
Lactic Acid, Venous: 1.65 mmol/L (ref 0.5–1.9)
Lactic Acid, Venous: 1.64 mmol/L (ref 0.5–1.9)

## 2017-03-01 LAB — COMPREHENSIVE METABOLIC PANEL
ALT: 86 U/L — ABNORMAL HIGH (ref 17–63)
AST: 57 U/L — ABNORMAL HIGH (ref 15–41)
Albumin: 3.7 g/dL (ref 3.5–5.0)
Alkaline Phosphatase: 59 U/L (ref 38–126)
Anion gap: 15 (ref 5–15)
BUN: 33 mg/dL — ABNORMAL HIGH (ref 6–20)
CO2: 19 mmol/L — ABNORMAL LOW (ref 22–32)
Calcium: 9.1 mg/dL (ref 8.9–10.3)
Chloride: 103 mmol/L (ref 101–111)
Creatinine, Ser: 2.59 mg/dL — ABNORMAL HIGH (ref 0.61–1.24)
GFR calc Af Amer: 23 mL/min — ABNORMAL LOW (ref >60)
GFR calc non Af Amer: 20 mL/min — ABNORMAL LOW (ref >60)
Glucose, Bld: 109 mg/dL — ABNORMAL HIGH (ref 65–99)
Potassium: 4.1 mmol/L (ref 3.5–5.1)
Sodium: 137 mmol/L (ref 135–145)
Total Bilirubin: 0.4 mg/dL (ref 0.3–1.2)
Total Protein: 8.1 g/dL (ref 6.5–8.1)

## 2017-03-01 LAB — CBC WITH DIFFERENTIAL/PLATELET
Basophils Absolute: 0 10*3/uL (ref 0.0–0.1)
Basophils Relative: 0 %
Eosinophils Absolute: 0.2 10*3/uL (ref 0.0–0.7)
Eosinophils Relative: 1 %
HCT: 38.9 % — ABNORMAL LOW (ref 39.0–52.0)
Hemoglobin: 13 g/dL (ref 13.0–17.0)
Lymphocytes Relative: 16 %
Lymphs Abs: 2.5 10*3/uL (ref 0.7–4.0)
MCH: 32.3 pg (ref 26.0–34.0)
MCHC: 33.4 g/dL (ref 30.0–36.0)
MCV: 96.8 fL (ref 78.0–100.0)
Monocytes Absolute: 1.1 10*3/uL — ABNORMAL HIGH (ref 0.1–1.0)
Monocytes Relative: 7 %
Neutro Abs: 12.3 10*3/uL — ABNORMAL HIGH (ref 1.7–7.7)
Neutrophils Relative %: 76 %
Platelets: 345 10*3/uL (ref 150–400)
RBC: 4.02 MIL/uL — ABNORMAL LOW (ref 4.22–5.81)
RDW: 13.7 % (ref 11.5–15.5)
WBC: 16.1 10*3/uL — ABNORMAL HIGH (ref 4.0–10.5)

## 2017-03-01 LAB — URINALYSIS, ROUTINE W REFLEX MICROSCOPIC
Bacteria, UA: NONE SEEN
Bilirubin Urine: NEGATIVE
Glucose, UA: NEGATIVE mg/dL
Ketones, ur: NEGATIVE mg/dL
Nitrite: NEGATIVE
Protein, ur: 30 mg/dL — AB
Specific Gravity, Urine: 1.011 (ref 1.005–1.030)
Squamous Epithelial / LPF: NONE SEEN
pH: 6 (ref 5.0–8.0)

## 2017-03-01 MED ORDER — CEPHALEXIN 500 MG PO CAPS: 500.0000 mg | ORAL_CAPSULE | Freq: Three times a day (TID) | ORAL | 0 refills | Status: DC

## 2017-03-01 MED ORDER — SODIUM CHLORIDE 0.9 % IV SOLN
1.0000 g | INTRAVENOUS | Status: DC
  Administered 2017-03-01: 1 g via INTRAVENOUS
  Filled 2017-03-01: qty 10

## 2017-03-01 MED ORDER — SODIUM CHLORIDE 0.9 % IV BOLUS (SEPSIS)
1000.0000 mL | Freq: Once | INTRAVENOUS | Status: AC
  Administered 2017-03-01: 1000 mL via INTRAVENOUS

## 2017-03-01 MED ORDER — SODIUM CHLORIDE 0.9 % IV SOLN
INTRAVENOUS | Status: DC
  Administered 2017-03-01: 20:00:00 via INTRAVENOUS

## 2017-03-01 NOTE — ED Triage Notes (Signed)
Patient sent from PCP for imaging of testicle. Reports swelling in right testicle for past 48 hours. Patient reports PCP stated he noticed little pus.

## 2017-03-01 NOTE — ED Notes (Signed)
ED Provider at bedside. 

## 2017-03-01 NOTE — Discharge Instructions (Signed)
Your creatinine today was slightly more elevated at 2.53.  Follow-up with Dr. Florene Glen

## 2017-03-01 NOTE — ED Provider Notes (Signed)
[facility] DEPT Provider Note   CSN: [id number] Arrival date & time: 03/01/17  1335     History   Chief Complaint Chief Complaint  Patient presents with  . Groin Swelling    right    HPI KHALEL ALMS is a 84 y.o. male.  84 year old male presents with 24-hour history of right testicular pain and swelling.  No fever or chills.  No vomiting or diarrhea.  Denies any dysuria or hematuria.  Does have history of chronic UTIs and takes antibiotics on a regular basis.  No flank pain.  Went to his doctor today who sent him here for a testicular ultrasound.  No treatment used prior to arrival      Past Medical History:  Diagnosis Date  . Arthritis   . Cancer [facility]    Colon  . Hyperlipidemia   . Hypertension   . Melanoma [facility])     Patient Active Problem List   Diagnosis Date Noted  . Right knee sprain 02/27/2015    Past Surgical History:  Procedure Laterality Date  . BALLOON DILATION N/A 04/02/2015   Procedure: BALLOON DILATION;  Surgeon: Bjorn Loser, MD;  Location: [facility]S;  Service: Urology;  Laterality: N/A;  . CATARACT EXTRACTION    . COLON SURGERY     1981 - colon cancer  . CYSTOSCOPY WITH RETROGRADE PYELOGRAM, URETEROSCOPY AND STENT PLACEMENT N/A 04/02/2015   Procedure: CYSTO WITH RETROGRADE ;  Surgeon: Bjorn Loser, MD;  Location: [facility];  Service: Urology;  Laterality: N/A;  . Left thub surgery    . TONSILLECTOMY    . URETHRAL STRICTURE DILATATION         Home Medications    Prior to Admission medications   Medication Sig Start Date End Date Taking? Authorizing Provider  acetaminophen (TYLENOL) 500 MG tablet Take 500-1,000 mg by mouth every 6 (six) hours as needed for moderate pain.   Yes [provider]  aspirin 81 MG tablet Take 81 mg by mouth daily.   Yes [provider]  diphenhydramine-acetaminophen (TYLENOL PM) 25-500 MG TABS tablet Take 1 tablet by mouth at bedtime.   Yes [provider]   finasteride (PROSCAR) 5 MG tablet Take 5 mg by mouth daily. Evening   Yes [provider]  lisinopril (PRINIVIL,ZESTRIL) 20 MG tablet Take 20 mg by mouth daily.    Yes [provider]  Multiple Vitamins-Minerals (PRESERVISION AREDS 2+MULTI VIT PO) Take 2 tablets by mouth daily.   Yes [provider]  simvastatin (ZOCOR) 20 MG tablet Take 20 mg by mouth daily.   Yes [provider]  trimethoprim (TRIMPEX) 100 MG tablet Take 100 mg by mouth daily.   Yes [provider]  HYDROcodone-acetaminophen (NORCO/VICODIN) 5-325 MG tablet Take 1 tablet by mouth every 6 (six) hours as needed for moderate pain. Patient not taking: Reported on 03/01/2017 04/02/15   Christell Faith, MD    Family History No family history on file.  Social History Social History   Tobacco Use  . Smoking status: Former Smoker    Last attempt to quit: 01/31/1977    Years since quitting: 40.1  . Smokeless tobacco: Never Used  Substance Use Topics  . Alcohol use: Yes    Alcohol/week: 0.6 oz    Types: 1 Glasses of wine per week  . Drug use: No     Allergies   Sulfa antibiotics   Review of Systems Review of Systems  All other systems reviewed and are  negative.    Physical Exam Updated Vital Signs BP (!) 141/80 (BP Location: Right Arm)   Pulse 98   Temp (!) 97.5 F (36.4 C) (Oral)   Resp 18   SpO2 98%   Physical Exam  Constitutional: He is oriented to person, place, and time. He appears well-developed and well-nourished.  Non-toxic appearance. No distress.  HENT:  Head: Normocephalic and atraumatic.  Eyes: Conjunctivae, EOM and lids are normal. Pupils are equal, round, and reactive to light.  Neck: Normal range of motion. Neck supple. No tracheal deviation present. No thyroid mass present.  Cardiovascular: Normal rate, regular rhythm and normal heart sounds. Exam reveals no gallop.  No murmur heard. Pulmonary/Chest: Effort normal and breath sounds normal. No  stridor. No respiratory distress. He has no decreased breath sounds. He has no wheezes. He has no rhonchi. He has no rales.  Abdominal: Soft. Normal appearance and bowel sounds are normal. He exhibits no distension. There is no tenderness. There is no rebound and no CVA tenderness.  Genitourinary: Penis normal. Right testis shows swelling and tenderness. Uncircumcised.     Musculoskeletal: Normal range of motion. He exhibits no edema or tenderness.  Neurological: He is alert and oriented to person, place, and time. He has normal strength. No cranial nerve deficit or sensory deficit. GCS eye subscore is 4. GCS verbal subscore is 5. GCS motor subscore is 6.  Skin: Skin is warm and dry. No abrasion and no rash noted.  Psychiatric: He has a normal mood and affect. His speech is normal and behavior is normal.  Nursing note and vitals reviewed.    ED Treatments / Results  Labs (all labs ordered are listed, but only abnormal results are displayed) Labs Reviewed  COMPREHENSIVE METABOLIC PANEL - Abnormal; Notable for the following components:      Result Value   CO2 19 (*)    Glucose, Bld 109 (*)    BUN 33 (*)    Creatinine, Ser 2.59 (*)    AST 57 (*)    ALT 86 (*)    GFR calc non Af Amer 20 (*)    GFR calc Af Amer 23 (*)    All other components within normal limits  CBC WITH DIFFERENTIAL/PLATELET - Abnormal; Notable for the following components:   WBC 16.1 (*)    RBC 4.02 (*)    HCT 38.9 (*)    Neutro Abs 12.3 (*)    Monocytes Absolute 1.1 (*)    All other components within normal limits  URINALYSIS, ROUTINE W REFLEX MICROSCOPIC  I-STAT CG4 LACTIC ACID, ED    EKG  EKG Interpretation None       Radiology [place] Scrotom W/doppler  Result Date: 03/01/2017 CLINICAL DATA:  Right-sided testicular pain and swelling x2 days. EXAM: SCROTAL ULTRASOUND DOPPLER ULTRASOUND OF THE TESTICLES TECHNIQUE: Complete ultrasound examination of the testicles, epididymis, and other scrotal structures  was performed. Color and spectral Doppler ultrasound were also utilized to evaluate blood flow to the testicles. COMPARISON:  None. FINDINGS: Right testicle Measurements: 2.8 x 2.2 x 2.4 cm. No mass or microlithiasis visualized. Left testicle Measurements: 3.1 x 1.7 x 2.3 cm. No mass or microlithiasis visualized. Right epididymis:  Hyperemic without focal mass. Left epididymis:  6 x 3 x 4 mm cyst in the upper pole. Hydrocele:  None visualized. Varicocele:  Right-sided varicoceles are present. Pulsed Doppler interrogation of both testes demonstrates normal low resistance arterial and venous waveforms bilaterally. IMPRESSION: 1. No evidence of testicular torsion or mass.  2. Hyperemic right epididymis suspicious for epididymitis. Right-sided varicoceles also present. Electronically Signed   By: Ashley Royalty M.D.   On: 03/01/2017 15:31    Procedures Procedures (including critical care time)  Medications Ordered in ED Medications  sodium chloride 0.9 % bolus 1,000 mL (not administered)  0.9 %  sodium chloride infusion (not administered)  cefTRIAXone (ROCEPHIN) 1 g in sodium chloride 0.9 % 100 mL IVPB (not administered)     Initial Impression / Assessment and Plan / ED Course  I have reviewed the triage vital signs and the nursing notes.  Pertinent labs & imaging results that were available during my care of the patient were reviewed by me and considered in my medical decision making (see chart for details).     Patient to be treated for epididymitis/UTI with IV Rocephin as well as oral Keflex to go home with.  Does have mild leukocytosis but lactate is normal.  Blood cultures are pending.  Renal function is slightly worse.  Will discharge and he will follow-up with his doctor  Final Clinical Impressions(s) / ED Diagnoses   Final diagnoses:  Testicle pain    ED Discharge Orders    None       Lacretia Leigh, MD 03/01/17 2112

## 2017-03-02 ENCOUNTER — Telehealth: Payer: Self-pay | Admitting: *Deleted

## 2017-03-02 NOTE — Telephone Encounter (Signed)
Pharmacy called related to Rx: Keflex .[facility]18 and Rx was given.

## 2017-03-05 DIAGNOSIS — N453 Epididymo-orchitis: Secondary | ICD-10-CM | POA: Diagnosis not present

## 2017-03-07 LAB — CULTURE, BLOOD (ROUTINE X 2)
Culture: NO GROWTH
Culture: NO GROWTH
Special Requests: ADEQUATE
Special Requests: ADEQUATE

## 2017-03-09 DIAGNOSIS — N3020 Other chronic cystitis without hematuria: Secondary | ICD-10-CM | POA: Diagnosis not present

## 2017-03-09 DIAGNOSIS — N453 Epididymo-orchitis: Secondary | ICD-10-CM | POA: Diagnosis not present

## 2017-04-07 DIAGNOSIS — N453 Epididymo-orchitis: Secondary | ICD-10-CM | POA: Diagnosis not present

## 2017-04-07 DIAGNOSIS — N3020 Other chronic cystitis without hematuria: Secondary | ICD-10-CM | POA: Diagnosis not present

## 2017-04-28 DIAGNOSIS — N3289 Other specified disorders of bladder: Secondary | ICD-10-CM | POA: Diagnosis not present

## 2017-04-28 DIAGNOSIS — Z6827 Body mass index (BMI) 27.0-27.9, adult: Secondary | ICD-10-CM | POA: Diagnosis not present

## 2017-04-28 DIAGNOSIS — N184 Chronic kidney disease, stage 4 (severe): Secondary | ICD-10-CM | POA: Diagnosis not present

## 2017-05-20 DIAGNOSIS — H6123 Impacted cerumen, bilateral: Secondary | ICD-10-CM | POA: Diagnosis not present

## 2017-06-11 DIAGNOSIS — R300 Dysuria: Secondary | ICD-10-CM | POA: Diagnosis not present

## 2017-06-11 DIAGNOSIS — N481 Balanitis: Secondary | ICD-10-CM | POA: Diagnosis not present

## 2017-06-11 DIAGNOSIS — R351 Nocturia: Secondary | ICD-10-CM | POA: Diagnosis not present

## 2017-06-11 DIAGNOSIS — N3020 Other chronic cystitis without hematuria: Secondary | ICD-10-CM | POA: Diagnosis not present

## 2017-06-16 DIAGNOSIS — Z0000 Encounter for general adult medical examination without abnormal findings: Secondary | ICD-10-CM | POA: Diagnosis not present

## 2017-06-16 DIAGNOSIS — D0359 Melanoma in situ of other part of trunk: Secondary | ICD-10-CM | POA: Diagnosis not present

## 2017-06-16 DIAGNOSIS — N183 Chronic kidney disease, stage 3 (moderate): Secondary | ICD-10-CM | POA: Diagnosis not present

## 2017-06-16 DIAGNOSIS — L814 Other melanin hyperpigmentation: Secondary | ICD-10-CM | POA: Diagnosis not present

## 2017-06-16 DIAGNOSIS — E785 Hyperlipidemia, unspecified: Secondary | ICD-10-CM | POA: Diagnosis not present

## 2017-06-16 DIAGNOSIS — R7309 Other abnormal glucose: Secondary | ICD-10-CM | POA: Diagnosis not present

## 2017-06-16 DIAGNOSIS — L821 Other seborrheic keratosis: Secondary | ICD-10-CM | POA: Diagnosis not present

## 2017-06-16 DIAGNOSIS — L570 Actinic keratosis: Secondary | ICD-10-CM | POA: Diagnosis not present

## 2017-06-16 DIAGNOSIS — C4441 Basal cell carcinoma of skin of scalp and neck: Secondary | ICD-10-CM | POA: Diagnosis not present

## 2017-06-16 DIAGNOSIS — Z23 Encounter for immunization: Secondary | ICD-10-CM | POA: Diagnosis not present

## 2017-06-16 DIAGNOSIS — I10 Essential (primary) hypertension: Secondary | ICD-10-CM | POA: Diagnosis not present

## 2017-06-16 DIAGNOSIS — Z85820 Personal history of malignant melanoma of skin: Secondary | ICD-10-CM | POA: Diagnosis not present

## 2017-06-16 DIAGNOSIS — C44319 Basal cell carcinoma of skin of other parts of face: Secondary | ICD-10-CM | POA: Diagnosis not present

## 2017-06-16 DIAGNOSIS — L578 Other skin changes due to chronic exposure to nonionizing radiation: Secondary | ICD-10-CM | POA: Diagnosis not present

## 2017-06-23 DIAGNOSIS — N3020 Other chronic cystitis without hematuria: Secondary | ICD-10-CM | POA: Diagnosis not present

## 2017-07-02 DIAGNOSIS — N3020 Other chronic cystitis without hematuria: Secondary | ICD-10-CM | POA: Diagnosis not present

## 2017-07-02 DIAGNOSIS — N453 Epididymo-orchitis: Secondary | ICD-10-CM | POA: Diagnosis not present

## 2017-07-06 DIAGNOSIS — Z85828 Personal history of other malignant neoplasm of skin: Secondary | ICD-10-CM | POA: Diagnosis not present

## 2017-07-06 DIAGNOSIS — C44319 Basal cell carcinoma of skin of other parts of face: Secondary | ICD-10-CM | POA: Diagnosis not present

## 2017-07-06 DIAGNOSIS — C4441 Basal cell carcinoma of skin of scalp and neck: Secondary | ICD-10-CM | POA: Diagnosis not present

## 2017-07-16 DIAGNOSIS — D0359 Melanoma in situ of other part of trunk: Secondary | ICD-10-CM | POA: Diagnosis not present

## 2017-07-16 DIAGNOSIS — C4359 Malignant melanoma of other part of trunk: Secondary | ICD-10-CM | POA: Diagnosis not present

## 2017-07-16 DIAGNOSIS — Z85828 Personal history of other malignant neoplasm of skin: Secondary | ICD-10-CM | POA: Diagnosis not present

## 2017-07-27 DIAGNOSIS — N3020 Other chronic cystitis without hematuria: Secondary | ICD-10-CM | POA: Diagnosis not present

## 2017-08-06 DIAGNOSIS — D0359 Melanoma in situ of other part of trunk: Secondary | ICD-10-CM | POA: Diagnosis not present

## 2017-08-06 DIAGNOSIS — Z85828 Personal history of other malignant neoplasm of skin: Secondary | ICD-10-CM | POA: Diagnosis not present

## 2017-09-16 ENCOUNTER — Ambulatory Visit (INDEPENDENT_AMBULATORY_CARE_PROVIDER_SITE_OTHER): Payer: PPO | Admitting: Family Medicine

## 2017-09-16 ENCOUNTER — Encounter: Payer: Self-pay | Admitting: Family Medicine

## 2017-09-16 DIAGNOSIS — M25551 Pain in right hip: Secondary | ICD-10-CM | POA: Insufficient documentation

## 2017-09-16 MED ORDER — PREDNISONE 5 MG PO TABS: ORAL_TABLET | ORAL | 0 refills | Status: DC

## 2017-09-16 NOTE — Progress Notes (Signed)
Jesus Kim - 84 y.o. male MRN [id number]  Date of birth: 07/20/1924  SUBJECTIVE:  Including CC & ROS.  Chief Complaint  Patient presents with  . Hip Pain    Jesus Kim is a 84 y.o. male that is presenting with right hip pain. Pain has been increasing over the past week. Located at his lateral right buttock. He noticed the pain after he was doing chair yoga. Denies injury or trauma. Pain is worse when standing and bearing weight on his foot. He has been applying ice and taking Tumeric. He uses the walker as a precaution. No radicular symptoms. His kidney doctor doesn't want him taking anti-inflammatories.     Review of Systems  Constitutional: Negative for fever.  HENT: Negative for congestion.   Respiratory: Negative for cough.   Cardiovascular: Negative for chest pain.  Gastrointestinal: Negative for abdominal pain.  Musculoskeletal: Positive for gait problem.  Skin: Negative for color change.  Neurological: Negative for weakness.  Hematological: Negative for adenopathy.  Psychiatric/Behavioral: Negative for agitation.    HISTORY: Past Medical, Surgical, Social, and Family History Reviewed & Updated per EMR.   Pertinent Historical Findings include:  Past Medical History:  Diagnosis Date  . Arthritis   . Cancer [facility])    Colon  . Hyperlipidemia   . Hypertension   . Melanoma [facility])     Past Surgical History:  Procedure Laterality Date  . BALLOON DILATION N/A 04/02/2015   Procedure: BALLOON DILATION;  Surgeon: Bjorn Loser, MD;  Location: [facility];  Service: Urology;  Laterality: N/A;  . CATARACT EXTRACTION    . COLON SURGERY     1981 - colon cancer  . CYSTOSCOPY WITH RETROGRADE PYELOGRAM, URETEROSCOPY AND STENT PLACEMENT N/A 04/02/2015   Procedure: CYSTO WITH RETROGRADE ;  Surgeon: Bjorn Loser, MD;  Location: [facility];  Service: Urology;  Laterality: N/A;  . Left thub surgery    . TONSILLECTOMY    . URETHRAL STRICTURE DILATATION      Allergies  Allergen  Reactions  . Sulfa Antibiotics Rash    No family history on file.   Social History   Socioeconomic History  . Marital status: Married    Spouse name: Not on file  . Number of children: 3  . Years of education: 52  . Highest education level: Not on file  Occupational History  . Not on file  Social Needs  . Financial resource strain: Not on file  . Food insecurity:    Worry: Not on file    Inability: Not on file  . Transportation needs:    Medical: Not on file    Non-medical: Not on file  Tobacco Use  . Smoking status: Former Smoker    Last attempt to quit: 01/31/1977    Years since quitting: 40.6  . Smokeless tobacco: Never Used  Substance and Sexual Activity  . Alcohol use: Yes    Alcohol/week: 1.0 standard drinks    Types: 1 Glasses of wine per week  . Drug use: No  . Sexual activity: Not on file  Lifestyle  . Physical activity:    Days per week: Not on file    Minutes per session: Not on file  . Stress: Not on file  Relationships  . Social connections:    Talks on phone: Not on file    Gets together: Not on file    Attends religious service: Not on file    Active member of club or organization: Not on file  Attends meetings of clubs or organizations: Not on file    Relationship status: Not on file  . Intimate partner violence:    Fear of current or ex partner: Not on file    Emotionally abused: Not on file    Physically abused: Not on file    Forced sexual activity: Not on file  Other Topics Concern  . Not on file  Social History Narrative  . Not on file     PHYSICAL EXAM:  VS: BP 118/64 (BP Location: Left Arm, Patient Position: Sitting, Cuff Size: Normal)   Pulse (!) 102   Ht 5\' 6"  (1.676 m)   Wt 191 lb (86.6 kg)   SpO2 95%   BMI 30.83 kg/m  Physical Exam Gen: NAD, alert, cooperative with exam, well-appearing ENT: normal lips, normal nasal mucosa,  Eye: normal EOM, normal conjunctiva and lids CV:  no edema, +2 pedal pulses   Resp: no  accessory muscle use, non-labored,  Skin: no rashes, no areas of induration  Neuro: normal tone, normal sensation to touch Psych:  normal insight, alert and oriented MSK:  Right hip:  TTP of the piriformis. Mild TTP of the GT No TTP of the SI joint and lower back muscles  Normal IR and ER  Normal strength to resistance with hip flexion, knee flexion and extension  Negative SLR b/l  Good strength with hip abduction with mild pain  Neurovascularly intact       ASSESSMENT & PLAN:   Right hip pain Pain seems to be muscular. Possible for glute med vs piriformis. Pain with standing on affected leg but no radicular type symptoms.  - prednisone  - counseled on HEP  - counseled on supportive care  - if no improvement consider PT vs injection.

## 2017-09-16 NOTE — Assessment & Plan Note (Signed)
Pain seems to be muscular. Possible for glute med vs piriformis. Pain with standing on affected leg but no radicular type symptoms.  - prednisone  - counseled on HEP  - counseled on supportive care  - if no improvement consider PT vs injection.

## 2017-09-16 NOTE — Patient Instructions (Signed)
Nice to meet you  Please try the medication  Please try the exercises  Please lacrosse ball to massage the area.  Please see me back in 2-3 weeks if the pain is ongoing.

## 2017-09-22 DIAGNOSIS — D0359 Melanoma in situ of other part of trunk: Secondary | ICD-10-CM | POA: Diagnosis not present

## 2017-09-22 DIAGNOSIS — Z85828 Personal history of other malignant neoplasm of skin: Secondary | ICD-10-CM | POA: Diagnosis not present

## 2017-09-29 DIAGNOSIS — N451 Epididymitis: Secondary | ICD-10-CM | POA: Diagnosis not present

## 2017-09-29 DIAGNOSIS — N184 Chronic kidney disease, stage 4 (severe): Secondary | ICD-10-CM | POA: Diagnosis not present

## 2017-09-29 DIAGNOSIS — Z6827 Body mass index (BMI) 27.0-27.9, adult: Secondary | ICD-10-CM | POA: Diagnosis not present

## 2017-09-29 DIAGNOSIS — N3289 Other specified disorders of bladder: Secondary | ICD-10-CM | POA: Diagnosis not present

## 2017-09-29 DIAGNOSIS — N35919 Unspecified urethral stricture, male, unspecified site: Secondary | ICD-10-CM | POA: Diagnosis not present

## 2017-10-07 ENCOUNTER — Telehealth: Payer: Self-pay | Admitting: *Deleted

## 2017-10-07 NOTE — Telephone Encounter (Signed)
Can either do larger prednisone dose  Or tylenol 500mg  3 times a dya  Or I will see him next week

## 2017-10-07 NOTE — Telephone Encounter (Signed)
Spoke with patient regarding hip pain-offered physical therapy versus injection per Dr. Raeford Razor note on 09/16/17. Marland Kitchen Patient declined would like a medication for the pain in the mean time before his appointment on 10/13/17 with Dr. Tamala Julian.  Patient completed prednisone with no improvement, has been using heat and a massager for the pain.    Copied from [place] [id number]. Topic: Inquiry >> Oct 07, 2017  9:03 AM Judyann Munson wrote: Reason for CRM:  patient is calling to request a call back he stated he was seen on 09-16-17 for lower hip pain, by Dr. Raeford Razor . He stating  he is still having pain, he wanted to know if something can be called in for the pain, please advise

## 2017-10-07 NOTE — Telephone Encounter (Signed)
Informed patient, He declined larger dose of prednisone. He is comfortable waiting until next week, taking Tylenol OTC and applying heat. Verbalized understanding.

## 2017-10-12 NOTE — Progress Notes (Signed)
[name] [place],  [address] Phone: [phone] Subjective:   [name], am serving as a scribe for Dr. Hulan Saas.  CC: Right hip pain  YQI:[id number]  Jesus Kim is a 84 y.o. male coming in with complaint of right hip pain. Pain over the greater trochanter and into the right glute. Pain worse in the morning. Uses ice in the morning for 10 minutes which alleviates his pain. Has pain at night. No pain with lying on right side. Was using a walker when pain gets the worse.       Past Medical History:  Diagnosis Date  . Arthritis   . Cancer [facility])    Colon  . Hyperlipidemia   . Hypertension   . Melanoma [facility])    Past Surgical History:  Procedure Laterality Date  . BALLOON DILATION N/A 04/02/2015   Procedure: BALLOON DILATION;  Surgeon: Bjorn Loser, MD;  [facility];  Service: Urology;  Laterality: N/A;  . CATARACT EXTRACTION    . COLON SURGERY     1981 - colon cancer  . CYSTOSCOPY WITH RETROGRADE PYELOGRAM, URETEROSCOPY AND STENT PLACEMENT N/A 04/02/2015   Procedure: CYSTO WITH RETROGRADE ;  Surgeon: Bjorn Loser, MD;  Location: [facility];  Service: Urology;  Laterality: N/A;  . Left thub surgery    . TONSILLECTOMY    . URETHRAL STRICTURE DILATATION     Social History   Socioeconomic History  . Marital status: Married    Spouse name: Not on file  . Number of children: 3  . Years of education: 19  . Highest education level: Not on file  Occupational History  . Not on file  Social Needs  . Financial resource strain: Not on file  . Food insecurity:    Worry: Not on file    Inability: Not on file  . Transportation needs:    Medical: Not on file    Non-medical: Not on file  Tobacco Use  . Smoking status: Former Smoker    Last attempt to quit: 01/31/1977    Years since quitting: 40.7  . Smokeless tobacco: Never Used  Substance and Sexual Activity  . Alcohol use: Yes    Alcohol/week: 1.0  standard drinks    Types: 1 Glasses of wine per week  . Drug use: No  . Sexual activity: Not on file  Lifestyle  . Physical activity:    Days per week: Not on file    Minutes per session: Not on file  . Stress: Not on file  Relationships  . Social connections:    Talks on phone: Not on file    Gets together: Not on file    Attends religious service: Not on file    Active member of club or organization: Not on file    Attends meetings of clubs or organizations: Not on file    Relationship status: Not on file  Other Topics Concern  . Not on file  Social History Narrative  . Not on file   Allergies  Allergen Reactions  . Sulfa Antibiotics Rash   No family history on file.   Current Outpatient Medications (Cardiovascular):  .  lisinopril (PRINIVIL,ZESTRIL) 20 MG tablet, Take 20 mg by mouth daily.  .  simvastatin (ZOCOR) 20 MG tablet, Take 20 mg by mouth daily.   Current Outpatient Medications (Analgesics):  .  acetaminophen (TYLENOL) 500 MG tablet, Take 500-1,000 mg by mouth every 6 (six) hours as needed  for moderate pain. Marland Kitchen  aspirin 81 MG tablet, Take 81 mg by mouth daily. Marland Kitchen  HYDROcodone-acetaminophen (NORCO/VICODIN) 5-325 MG tablet, Take 1 tablet by mouth every 6 (six) hours as needed for moderate pain.   Current Outpatient Medications (Other):  .  diphenhydramine-acetaminophen (TYLENOL PM) 25-500 MG TABS tablet, Take 1 tablet by mouth at bedtime. .  finasteride (PROSCAR) 5 MG tablet, Take 5 mg by mouth daily. Evening .  Multiple Vitamins-Minerals (PRESERVISION AREDS 2+MULTI VIT PO), Take 2 tablets by mouth daily. Marland Kitchen  trimethoprim (TRIMPEX) 100 MG tablet, Take 100 mg by mouth daily.    Past medical history, social, surgical and family history all reviewed in electronic medical record.  No pertanent information unless stated regarding to the chief complaint.   Review of Systems:  No headache, visual changes, nausea, vomiting, diarrhea, constipation, dizziness, abdominal  pain, skin rash, fevers, chills, night sweats, weight loss, swollen lymph nodes, body aches, joint swelling,chest pain, shortness of breath, mood changes.  Positive muscle aches  Objective  Blood pressure 122/72, pulse (!) 120, height 5\' 6"  (1.676 m), weight 165 lb (74.8 kg), SpO2 96 %.    General: No apparent distress alert and oriented x3 mood and affect normal, dressed appropriately.  HEENT: Pupils equal, extraocular movements intact  Respiratory: Patient's speak in full sentences and does not appear short of breath  Cardiovascular: No lower extremity edema, non tender, no erythema  Skin: Warm dry intact with no signs of infection or rash on extremities or on axial skeleton.  Abdomen: Soft nontender  Neuro: Cranial nerves II through XII are intact, neurovascularly intact in all extremities with 2+ DTRs and 2+ pulses.  Lymph: No lymphadenopathy of posterior or anterior cervical chain or axillae bilaterally.  Gait normal with good balance and coordination.  MSK:  Non tender with full range of motion and good stability and symmetric strength and tone of shoulders, elbows, wrist, , knee and ankles bilaterally.  Arthritic changes of multiple joints   Right hip exam shows the patient has near full internal range of motion with some mild tightness with external range of motion Tenderness over the gluteal area especially in the tendon near the insertion on the lateral greater trochanteric area.  Positive Faber but negative straight leg test.  Procedure: Real-time Ultrasound Guided Injection of the right gluteal tendon tear Device: GE Logiq Q7 Ultrasound guided injection is preferred based studies that show increased duration, increased effect, greater accuracy, decreased procedural pain, increased response rate, and decreased cost with ultrasound guided versus blind injection.  Verbal informed consent obtained.  Time-out conducted.  Noted no overlying erythema, induration, or other signs of  local infection.  Skin prepped in a sterile fashion.  Local anesthesia: Topical Ethyl chloride.  With sterile technique and under real time ultrasound guidance: With a 21-gauge 2 inch needle patient was injected with 0.5 cc of 0.5% Marcaine and 0.5 cc of Kenalog 40 mg/mL into the tendon sheath near the insertion of the greater trochanteric area. Completed without difficulty  Pain immediately resolved suggesting accurate placement of the medication.  Advised to call if fevers/chills, erythema, induration, drainage, or persistent bleeding.  Images permanently stored and available for review in the ultrasound unit.  Impression: Technically successful ultrasound guided injection.    Impression and Recommendations:     This case required medical decision making of moderate complexity. The above documentation has been reviewed and is accurate and complete [name], DO       Note: This dictation was  prepared with Dragon dictation along with smaller phrase technology. Any transcriptional errors that result from this process are unintentional.

## 2017-10-13 ENCOUNTER — Ambulatory Visit: Payer: Self-pay

## 2017-10-13 ENCOUNTER — Ambulatory Visit: Payer: PPO | Admitting: Family Medicine

## 2017-10-13 ENCOUNTER — Encounter: Payer: Self-pay | Admitting: Family Medicine

## 2017-10-13 VITALS — BP 122/72 | HR 120 | Ht 66.0 in | Wt 165.0 lb

## 2017-10-13 DIAGNOSIS — M7601 Gluteal tendinitis, right hip: Secondary | ICD-10-CM | POA: Diagnosis not present

## 2017-10-13 DIAGNOSIS — M25551 Pain in right hip: Secondary | ICD-10-CM

## 2017-10-13 NOTE — Patient Instructions (Addendum)
Good to see you  Ice is your friend Stay active Give today off and go easy  Then restart the exercises and do them 3 times a week  pennsaid pinkie amount topically 2 times daily as needed.  See me again in 4-6 weeks

## 2017-10-13 NOTE — Assessment & Plan Note (Signed)
Given injection.  Tolerated the procedure well.  Discussed icing regimen and home exercise.  Discussed which activities to do which wants to avoid.  Follow-up again in 4 weeks

## 2017-10-30 NOTE — Progress Notes (Signed)
[name] [place], [place] [address] Phone: [phone] Subjective:    I Jesus Kim am serving as a Education administrator for Dr. Hulan Saas.   CC: Right hip pain  [id number]  KIENAN DOUBLIN is a 84 y.o. male coming in with complaint of right hip pain. States that the hip is not doing well. During the middle of last week he felt pain. Got a shingles and flu shot recently. Weight bearing is painful. Was injected last visit. Has been doing the home exercises.   Patient was found to have more of a gluteal tendinitis.  Not doing much better at this time no.  Some mild radiation of the leg but very minimal.  States that he does have back pain from time to time.      Past Medical History:  Diagnosis Date  . Arthritis   . Cancer [facility])    Colon  . Hyperlipidemia   . Hypertension   . Melanoma [facility])    Past Surgical History:  Procedure Laterality Date  . BALLOON DILATION N/A 04/02/2015   Procedure: BALLOON DILATION;  Surgeon: Bjorn Loser, MD;  Location: [facility];  Service: Urology;  Laterality: N/A;  . CATARACT EXTRACTION    . COLON SURGERY     1981 - colon cancer  . CYSTOSCOPY WITH RETROGRADE PYELOGRAM, URETEROSCOPY AND STENT PLACEMENT N/A 04/02/2015   Procedure: CYSTO WITH RETROGRADE ;  Surgeon: Bjorn Loser, MD;  [facility];  Service: Urology;  Laterality: N/A;  . Left thub surgery    . TONSILLECTOMY    . URETHRAL STRICTURE DILATATION     Social History   Socioeconomic History  . Marital status: Married    Spouse name: Not on file  . Number of children: 3  . Years of education: 54  . Highest education level: Not on file  Occupational History  . Not on file  Social Needs  . Financial resource strain: Not on file  . Food insecurity:    Worry: Not on file    Inability: Not on file  . Transportation needs:    Medical: Not on file    Non-medical: Not on file  Tobacco Use  . Smoking status: Former Smoker    Last attempt to  quit: 01/31/1977    Years since quitting: 40.7  . Smokeless tobacco: Never Used  Substance and Sexual Activity  . Alcohol use: Yes    Alcohol/week: 1.0 standard drinks    Types: 1 Glasses of wine per week  . Drug use: No  . Sexual activity: Not on file  Lifestyle  . Physical activity:    Days per week: Not on file    Minutes per session: Not on file  . Stress: Not on file  Relationships  . Social connections:    Talks on phone: Not on file    Gets together: Not on file    Attends religious service: Not on file    Active member of club or organization: Not on file    Attends meetings of clubs or organizations: Not on file    Relationship status: Not on file  Other Topics Concern  . Not on file  Social History Narrative  . Not on file   Allergies  Allergen Reactions  . Sulfa Antibiotics Rash   History reviewed. No pertinent family history.   Current Outpatient Medications (Cardiovascular):  .  lisinopril (PRINIVIL,ZESTRIL) 20 MG tablet, Take 20 mg by mouth daily.  Marland Kitchen  simvastatin (ZOCOR) 20 MG tablet, Take 20 mg by mouth daily.   Current Outpatient Medications (Analgesics):  .  acetaminophen (TYLENOL) 500 MG tablet, Take 500-1,000 mg by mouth every 6 (six) hours as needed for moderate pain. Marland Kitchen  aspirin 81 MG tablet, Take 81 mg by mouth daily. Marland Kitchen  HYDROcodone-acetaminophen (NORCO/VICODIN) 5-325 MG tablet, Take 1 tablet by mouth every 6 (six) hours as needed for moderate pain.   Current Outpatient Medications (Other):  .  diphenhydramine-acetaminophen (TYLENOL PM) 25-500 MG TABS tablet, Take 1 tablet by mouth at bedtime. .  finasteride (PROSCAR) 5 MG tablet, Take 5 mg by mouth daily. Evening .  Multiple Vitamins-Minerals (PRESERVISION AREDS 2+MULTI VIT PO), Take 2 tablets by mouth daily. Marland Kitchen  trimethoprim (TRIMPEX) 100 MG tablet, Take 100 mg by mouth daily. Marland Kitchen  gabapentin (NEURONTIN) 100 MG capsule, Take 1 capsule (100 mg total) by mouth at bedtime.    Past medical history,  social, surgical and family history all reviewed in electronic medical record.  No pertanent information unless stated regarding to the chief complaint.   Review of Systems:  No headache, visual changes, nausea, vomiting, diarrhea, constipation, dizziness, abdominal pain, skin rash, fevers, chills, night sweats, weight loss, swollen lymph nodes, body aches, joint swelling, chest pain, shortness of breath, mood changes.  Positive muscle aches  Objective  Blood pressure 130/70, pulse (!) 115, height 5\' 6"  (1.676 m), weight 166 lb (75.3 kg), SpO2 95 %.    General: No apparent distress alert and oriented x3 mood and affect normal, dressed appropriately.  HEENT: Pupils equal, extraocular movements intact  Respiratory: Patient's speak in full sentences and does not appear short of breath  Cardiovascular: No lower extremity edema, non tender, no erythema  Skin: Warm dry intact with no signs of infection or rash on extremities or on axial skeleton.  Abdomen: Soft nontender  Neuro: Cranial nerves II through XII are intact, neurovascularly intact in all extremities with 2+ DTRs and 2+ pulses.  Lymph: No lymphadenopathy of posterior or anterior cervical chain or axillae bilaterally.  Gait mild antalgic.  MSK:  Non tender with near full range of motion and good stability and symmetric strength and tone of shoulders, elbows, wrist,  knee and ankles bilaterally.  Patient's arthritis is less than what would be anticipated for 84 year old. Back exam does have some mild loss of lordosis with increasing the discomfort in the paraspinal musculature of the lumbar spine right greater than left.  Seems to be mostly around the L4-L5 on the right.  Positive Corky Sox still on the right.  Negative straight leg test.  Neurovascularly intact distally.  Hip does have very mild decreased range of motion in all planes by 5 degrees.     Impression and Recommendations:     This case required medical decision making of moderate  complexity. The above documentation has been reviewed and is accurate and complete [name], DO       Note: This dictation was prepared with Dragon dictation along with smaller phrase technology. Any transcriptional errors that result from this process are unintentional.

## 2017-11-01 ENCOUNTER — Ambulatory Visit: Payer: PPO | Admitting: Family Medicine

## 2017-11-01 ENCOUNTER — Encounter: Payer: Self-pay | Admitting: Family Medicine

## 2017-11-01 ENCOUNTER — Ambulatory Visit (INDEPENDENT_AMBULATORY_CARE_PROVIDER_SITE_OTHER)
Admission: RE | Admit: 2017-11-01 | Discharge: 2017-11-01 | Disposition: A | Discharge status: Home / Self Care | Payer: PPO | Source: Ambulatory Visit | Attending: Family Medicine | Admitting: Family Medicine

## 2017-11-01 VITALS — BP 130/70 | HR 115 | Ht 66.0 in | Wt 166.0 lb

## 2017-11-01 DIAGNOSIS — M549 Dorsalgia, unspecified: Secondary | ICD-10-CM

## 2017-11-01 DIAGNOSIS — M25551 Pain in right hip: Secondary | ICD-10-CM

## 2017-11-01 DIAGNOSIS — M1611 Unilateral primary osteoarthritis, right hip: Secondary | ICD-10-CM | POA: Diagnosis not present

## 2017-11-01 DIAGNOSIS — M4186 Other forms of scoliosis, lumbar region: Secondary | ICD-10-CM | POA: Diagnosis not present

## 2017-11-01 MED ORDER — GABAPENTIN 100 MG PO CAPS: 100.0000 mg | ORAL_CAPSULE | Freq: Every day | ORAL | 3 refills | Status: DC

## 2017-11-01 NOTE — Patient Instructions (Signed)
Good to see you  Ice is your friend PT will call you  Xray of back and hip  Gabapentin 100mg  at night See me again ias scheduled Stop the one exercise.

## 2017-11-01 NOTE — Assessment & Plan Note (Signed)
Patient does have more of a gluteal tendinitis but we do need to rule out possible lumbar radiculopathy.  Gabapentin given.  X-rays ordered.  Patient will be sent to formal physical therapy that I think will be beneficial.  Discussed icing regimen and home exercises.  X-rays pending.  Follow-up with me again as scheduled in 3 and half weeks

## 2017-11-03 DIAGNOSIS — H353131 Nonexudative age-related macular degeneration, bilateral, early dry stage: Secondary | ICD-10-CM | POA: Diagnosis not present

## 2017-11-03 DIAGNOSIS — Z961 Presence of intraocular lens: Secondary | ICD-10-CM | POA: Diagnosis not present

## 2017-11-03 DIAGNOSIS — H5203 Hypermetropia, bilateral: Secondary | ICD-10-CM | POA: Diagnosis not present

## 2017-11-04 ENCOUNTER — Encounter: Payer: Self-pay | Admitting: Physical Therapy

## 2017-11-04 ENCOUNTER — Ambulatory Visit: Payer: PPO | Attending: Family Medicine | Admitting: Physical Therapy

## 2017-11-04 ENCOUNTER — Other Ambulatory Visit: Payer: Self-pay

## 2017-11-04 ENCOUNTER — Encounter: Payer: Self-pay | Admitting: Family Medicine

## 2017-11-04 DIAGNOSIS — M25551 Pain in right hip: Secondary | ICD-10-CM | POA: Diagnosis not present

## 2017-11-04 DIAGNOSIS — R293 Abnormal posture: Secondary | ICD-10-CM | POA: Diagnosis not present

## 2017-11-04 DIAGNOSIS — R262 Difficulty in walking, not elsewhere classified: Secondary | ICD-10-CM | POA: Diagnosis not present

## 2017-11-04 DIAGNOSIS — M545 Low back pain, unspecified: Secondary | ICD-10-CM

## 2017-11-04 DIAGNOSIS — M6281 Muscle weakness (generalized): Secondary | ICD-10-CM | POA: Diagnosis not present

## 2017-11-04 NOTE — Patient Instructions (Signed)
Step 1  Step 2  Sidelying Hip Abduction reps: 10  sets: 2  hold: 30  daily: 1  weekly: 7 Setup  Begin lying on your side with your top leg straight and your bottom leg bent. Movement  Lift your top leg up toward the ceiling, then slowly lower it back down and repeat. Tip  Make sure to keep your leg straight and do not let your hips roll backward or forward during the exercise. Step 1  Step 2  Clamshell reps: 10  sets: 2  hold: 5  daily: 1  weekly: 7 Setup  Begin lying on your side with your knees bent and your hips and shoulders stacked. Movement  Engage your abdominals and raise your top knee up toward the ceiling, then slowly return to the starting position and repeat.  Tip  Make sure to keep your core engaged and do not roll your hips forward or backward during the exercise. Step 1  Step 2  Seated Piriformis Stretch with Trunk Bend reps: 3  sets: 1  hold: 30  daily: 1  weekly: 7 Setup  Begin sitting upright in a chair with one ankle resting on your opposite knee. Movement  Slowly lean forward, gently pressing down on your bent leg with your hands until you feel a stretch along the underside of your thigh. Hold this position. Tip  Make sure to keep your back straight as you bend forward. Step 1  Step 2  Supine Hamstring Stretch with Strap reps: 3  sets: 1  hold: 30  daily: 1  weekly: 7 Setup  Begin lying on your back with your legs straight, holding the end of a strap that is looped around one foot.  Movement  Use the strap to pull your leg up toward your body until you feel a gentle stretch in the back of your upper leg. Hold this position. Tip  Make sure to keep your other leg straight on the ground during the stretch. Step 1  Step 2  Supine ITB Stretch with Strap reps: 3  sets: 1  hold: 30  daily: 1  weekly: 7 Setup Begin by lying on your back with your legs straight and a strap secured around one foot, holding the end in your opposite hand.   Movement Pull on the strap to draw your leg diagonally across your body and hold, feeling a stretch on the outside of your leg.  Tip Make sure to keep your shoulders and hips on the ground during the stretch.

## 2017-11-05 NOTE — Therapy (Signed)
[place], [place], [address] Phone: [phone]   Fax:  [phone]  Physical Therapy Evaluation  Patient Details  Name: Jesus Kim MRN: [id number] Date of Birth: 05-22-1924 Referring Provider (PT): Dr. Hulan Saas   Encounter Date: 11/04/2017  PT End of Session - 11/04/17 1936    Visit Number  1    Number of Visits  12    Date for PT Re-Evaluation  12/30/17    PT Start Time  1500    PT Stop Time  1550    PT Time Calculation (min)  50 min    Activity Tolerance  Patient tolerated treatment well    Behavior During Therapy  [facility] for tasks assessed/performed       Past Medical History:  Diagnosis Date  . Arthritis   . Cancer [facility])    Colon  . Hyperlipidemia   . Hypertension   . Melanoma [facility])     Past Surgical History:  Procedure Laterality Date  . BALLOON DILATION N/A 04/02/2015   Procedure: BALLOON DILATION;  Surgeon: Bjorn Loser, MD;  [facility];  Service: Urology;  Laterality: N/A;  . CATARACT EXTRACTION    . COLON SURGERY     1981 - colon cancer  . CYSTOSCOPY WITH RETROGRADE PYELOGRAM, URETEROSCOPY AND STENT PLACEMENT N/A 04/02/2015   Procedure: CYSTO WITH RETROGRADE ;  Surgeon: Bjorn Loser, MD;  [facility];  Service: Urology;  Laterality: N/A;  . Left thub surgery    . TONSILLECTOMY    . URETHRAL STRICTURE DILATATION      There were no vitals filed for this visit.   Subjective Assessment - 11/04/17 1503    Subjective  Patient reports Rt hip pain which began mid to late Aug 2019.  It began gradually.  He received an injection (bursitis).  Pain has returned and is now a bit more in his Rt pelvis.   He reports difficulty standing on Rt LE.  He has a change in gait, some low lumbar discomfort /soreness. Has difficulty in AM with Weightbearing.  Improves some with light walking.  By the time he gets home he needs to sit, rest, ice.      Pertinent History  Rt knee pain  which improved with brace.     Limitations  Lifting;Standing;Walking;House hold activities    How long can you walk comfortably?  pain after 10 min of vacuuming, can walk in the grocery store 20 min     Diagnostic tests  XR     Patient Stated Goals  Patient would like to be able to walk without pain.     Currently in Pain?  Yes    Pain Score  5     Pain Location  Hip    Pain Orientation  Right    Pain Type  Acute pain    Pain Onset  More than a month ago    Pain Frequency  Intermittent    Aggravating Factors   Rt sided weight bearing , AM , over activity     Pain Relieving Factors  sitting, laying down     Effect of Pain on Daily Activities  limits mobility          [facility] PT Assessment - 11/05/17 0001      Assessment   Medical Diagnosis  hip pain R     Referring Provider (PT)  Dr. Hulan Saas    Onset Date/Surgical Date  --   Aug  2019   Next MD Visit  Yes, 4 weeks     Prior Therapy  No       Precautions   Precautions  None      Restrictions   Weight Bearing Restrictions  No      Balance Screen   Has the patient fallen in the past 6 months  No      [place] residence    Living Arrangements  Spouse/significant other    Type of [place]    Additional Comments  reports no issues.  Wife is in general good health.  Many family members nearby       Prior Function   Level of Independence  Independent    Vocation  Retired    Leisure  family, does chair yoga at [employer]   Overall Cognitive Status  Within Functional Limits for tasks assessed      Observation/Other Assessments   Focus on Therapeutic Outcomes (FOTO)   50%      Sensation   Light Touch  Appears Intact      Posture/Postural Control   Posture/Postural Control  Postural limitations    Postural Limitations  Rounded Shoulders;Forward head;Decreased lumbar lordosis;Posterior pelvic tilt      AROM   Lumbar Flexion  WFL increased post weight shift     Lumbar Extension  WFL soreness    Lumbar - Right Side Bend  pinch Rt side, tight    Lumbar - Left Side Bend  WFL      PROM   Overall PROM Comments  tight hips R>L in internal >external rotaion.  <10 deg difference.  No pain elicited.       Strength   Right Hip Flexion  4/5    Right Hip Extension  3+/5    Right Hip ABduction  3/5   mod cues for set up of MMT   Left Hip Flexion  4/5    Left Hip Extension  4/5    Left Hip ABduction  3+/5    Right Knee Flexion  5/5    Right Knee Extension  5/5    Left Knee Flexion  5/5    Left Knee Extension  5/5      Flexibility   Hamstrings  about 55-60 deg SLR    Quadriceps  tight    ITB  tight    Piriformis  tight R>L       Palpation   Spinal mobility  hypomobile throughout L spine     Palpation comment  no pain in lumbar.  Soreness throughout R>L hip.  Piriformis tender but well tolerated.       Special Tests    Special Tests  Lumbar;Hip Special Tests      FABER test   findings  Positive    Side  Right    Comment  min post Rt hip       Straight Leg Raise   Findings  Negative      Trendelenburg Test   Findings  Positive    Side  Right      Hip Scouring   Findings  Negative    Side  Right      Bed Mobility   Bed Mobility  Rolling Right;Rolling Left;Left Sidelying to Sit;Supine to Sit    Rolling Right  Independent    Rolling Left  Independent    Left Sidelying to Sit  Independent  Supine to Sit  Independent      Transfers   Comments  I with all       Ambulation/Gait   Ambulation Distance (Feet)  150 Feet    Assistive device  None    Gait Pattern  Antalgic;Lateral hip instability;Trunk flexed    Ambulation Surface  Level;Indoor                Objective measurements completed on examination: See above findings.      [facility] Adult PT Treatment/Exercise - 11/05/17 0001      Knee/Hip Exercises: Stretches   Active Hamstring Stretch  Both;2 reps    ITB Stretch  Both;2 reps    Piriformis Stretch  Both;2  reps    Piriformis Stretch Limitations  seated       Knee/Hip Exercises: Sidelying   Hip ABduction  Strengthening;Both;1 set    Clams  x 10              PT Education - 11/04/17 1935    Education Details  PT/POC, benefits of exercises, stretching, MHP vs heat , piriformis stretch technique    Person(s) Educated  Patient    Methods  Explanation;Demonstration;Tactile cues;Verbal cues;Handout    Comprehension  Verbalized understanding;Returned demonstration;Need further instruction                  Plan - 11/04/17 1938    Clinical Impression Statement  Patient presents for mod complexity eval of acute Rt sided hip pain.  His pain has changed to be more proximal, up into posterior lateral hip and back following a cortisone injection for bursitis.  He presents with abnormal gait, hip and core weakness, anterior hip tightness, mild balance impairment. His complains do seem musculoskeletal in nature.  The exercises he has been doing have not helped this far but when he demonstrated he was not exactly doing the exercise as directed.  He should do well with time and consistent corrective exercises, mobility and modlities as needed.     History and Personal Factors relevant to plan of care:  age, chronic health issues    Clinical Presentation  Evolving    Clinical Presentation due to:  pain has changed from lateral hip to more buttock/low lumbar     Clinical Decision Making  Moderate    Rehab Potential  Poor    PT Frequency  2x / week    PT Duration  8 weeks   6-8 weeks if needed    PT Treatment/Interventions  ADLs/Self Care Home Management;Patient/family education;Functional mobility training;Iontophoresis 4mg /ml Dexamethasone;Moist Heat;Therapeutic activities    PT Next Visit Plan  check HEP and ensure technique , manual to Rt hip, [place]?    PT Home Exercise Plan  piriformis (sitting works better for him), hamstring, ITB, hip abd, clam     Consulted and Agree with Plan of Care   Patient       Patient will benefit from skilled therapeutic intervention in order to improve the following deficits and impairments:  Abnormal gait, Increased fascial restricitons, Difficulty walking, Decreased range of motion, Decreased endurance, Pain, Decreased balance, Impaired flexibility, Hypomobility, Postural dysfunction, Decreased strength, Decreased mobility  Visit Diagnosis: Pain in right hip  Muscle weakness (generalized)  Abnormal posture  Difficulty in walking, not elsewhere classified  Acute bilateral low back pain without sciatica     Problem List Patient Active Problem List   Diagnosis Date Noted  . Gluteal tendinitis of right buttock 10/13/2017  . Right hip pain 09/16/2017  .  Right knee sprain 02/27/2015    Diandre Merica 11/05/2017, 11:22 AM  [place], [place], [address] Phone: [phone]   Fax:  [phone]  Name: Jesus Kim MRN: [id number] Date of Birth: 14-Apr-1924   Raeford Razor, PT 11/05/17 11:23 AM Phone: [phone] Fax: [phone]

## 2017-11-09 ENCOUNTER — Encounter: Payer: Self-pay | Admitting: Physical Therapy

## 2017-11-09 ENCOUNTER — Ambulatory Visit: Payer: PPO | Admitting: Physical Therapy

## 2017-11-09 DIAGNOSIS — M25551 Pain in right hip: Secondary | ICD-10-CM

## 2017-11-09 DIAGNOSIS — M545 Low back pain, unspecified: Secondary | ICD-10-CM

## 2017-11-09 DIAGNOSIS — M6281 Muscle weakness (generalized): Secondary | ICD-10-CM

## 2017-11-09 DIAGNOSIS — R293 Abnormal posture: Secondary | ICD-10-CM

## 2017-11-09 DIAGNOSIS — R262 Difficulty in walking, not elsewhere classified: Secondary | ICD-10-CM

## 2017-11-09 NOTE — Therapy (Signed)
[place], [place], [address] Phone: [phone]   Fax:  [phone]  Physical Therapy Treatment  Patient Details  Name: Jesus Kim MRN: [id number] Date of Birth: 04/19/1924 Referring Provider (PT): Dr. Hulan Saas   Encounter Date: 11/09/2017  PT End of Session - 11/09/17 1335    Visit Number  2    Number of Visits  12    Date for PT Re-Evaluation  12/30/17    PT Start Time  1330    PT Stop Time  1423    PT Time Calculation (min)  53 min    Activity Tolerance  Patient tolerated treatment well    Behavior During Therapy  [facility] for tasks assessed/performed       Past Medical History:  Diagnosis Date  . Arthritis   . Cancer [facility])    Colon  . Hyperlipidemia   . Hypertension   . Melanoma [facility])     Past Surgical History:  Procedure Laterality Date  . BALLOON DILATION N/A 04/02/2015   Procedure: BALLOON DILATION;  Surgeon: Bjorn Loser, MD;  [facility];  Service: Urology;  Laterality: N/A;  . CATARACT EXTRACTION    . COLON SURGERY     1981 - colon cancer  . CYSTOSCOPY WITH RETROGRADE PYELOGRAM, URETEROSCOPY AND STENT PLACEMENT N/A 04/02/2015   Procedure: CYSTO WITH RETROGRADE ;  Surgeon: Bjorn Loser, MD;  [facility];  Service: Urology;  Laterality: N/A;  . Left thub surgery    . TONSILLECTOMY    . URETHRAL STRICTURE DILATATION      There were no vitals filed for this visit.  Subjective Assessment - 11/09/17 1332    Subjective  Sore today in Rt low back.  Did chair yoga today.  No hip pain.            [facility] Adult PT Treatment/Exercise - 11/09/17 0001      Lumbar Exercises: Stretches   Single Knee to Chest Stretch  2 reps    Lower Trunk Rotation  10 seconds    Lower Trunk Rotation Limitations  x 10     Hip Flexor Stretch  Right;1 rep    Pelvic Tilt  10 reps    Pelvic Tilt Limitations  cues     Standing Side Bend  Right;2 reps;30 seconds      Lumbar Exercises:  Supine   Pelvic Tilt  10 reps    Pelvic Tilt Limitations  cues     Bent Knee Raise  10 reps      Knee/Hip Exercises: Standing   Other Standing Knee Exercises  hip drops glute med on step x 2 x 10       Knee/Hip Exercises: Sidelying   Hip ABduction  Strengthening;Right;2 sets;10 reps    Clams  x 20       Moist Heat Therapy   Number Minutes Moist Heat  10 Minutes    Moist Heat Location  Lumbar Spine      Manual Therapy   Manual Therapy  Soft tissue mobilization;Myofascial release    Soft tissue mobilization  Rt Quadratus lumborum, lumabr paraspinals     Myofascial Release  Rt trunk              PT Education - 11/09/17 1415    Education Details  Quadratus Lumborum and stretch in sidelying , gait     Person(s) Educated  Patient    Methods  Explanation;Handout    Comprehension  Verbalized  understanding;Returned demonstration;Verbal cues required          PT Long Term Goals - 11/09/17 1335      PT LONG TERM GOAL #1   Title  Pt will be able to show Independence with HEP    Time  6    Period  Weeks    Status  New    Target Date  12/30/17      PT LONG TERM GOAL #2   Title  Pt will be able to wake in the AM with 50% less stiffness in his back, R hip     Time  6    Period  Weeks    Status  New    Target Date  12/30/17      PT LONG TERM GOAL #3   Title  Pt will improve his FOTO score to less than 40% impaired.      Time  6    Period  Weeks    Status  New    Target Date  12/30/17      PT LONG TERM GOAL #4   Title  Pt will be able to walk for 30 min and have no increased Rt sided low back pain     Time  6    Period  Weeks    Status  New    Target Date  12/30/17      PT LONG TERM GOAL #5   Title  Pt will increase Rt hip abd/glute med  strength to 4+/5 to improve gait and pelvic stability     Time  6    Period  Weeks    Status  New    Target Date  12/30/17            Plan - 11/09/17 1624    Clinical Impression Statement  Patient with Rt sided glute  med weakess, evident with gait.  Quadratus hypertonic.  Worked to balance Rt trunk, given sidelying QL for home.  Goals in progress.     PT Treatment/Interventions  ADLs/Self Care Home Management;Patient/family education;Functional mobility training;Iontophoresis 4mg /ml Dexamethasone;Moist Heat;Therapeutic activities    PT Next Visit Plan  check HEP and ensure technique , manual to Rt hip, [place]?    PT Home Exercise Plan  piriformis (sitting works better for him), hamstring, ITB, hip abd, clam , Rt QL     Consulted and Agree with Plan of Care  Patient       Patient will benefit from skilled therapeutic intervention in order to improve the following deficits and impairments:  Abnormal gait, Increased fascial restricitons, Difficulty walking, Decreased range of motion, Decreased endurance, Pain, Decreased balance, Impaired flexibility, Hypomobility, Postural dysfunction, Decreased strength, Decreased mobility  Visit Diagnosis: Pain in right hip  Muscle weakness (generalized)  Abnormal posture  Acute bilateral low back pain without sciatica  Difficulty in walking, not elsewhere classified     Problem List Patient Active Problem List   Diagnosis Date Noted  . Gluteal tendinitis of right buttock 10/13/2017  . Right hip pain 09/16/2017  . Right knee sprain 02/27/2015    Leontina Skidmore 11/09/2017, 4:25 PM  [facility] [address] [place], [place], [address] Phone: [phone]   Fax:  [phone]  Name: Jesus Kim MRN: [id number] Date of Birth: [birth year]   Raeford Razor, PT 11/09/17 4:26 PM Phone: [phone] Fax: [phone]

## 2017-11-10 ENCOUNTER — Encounter: Payer: Self-pay | Admitting: Family Medicine

## 2017-11-16 ENCOUNTER — Encounter: Payer: Self-pay | Admitting: Physical Therapy

## 2017-11-16 ENCOUNTER — Ambulatory Visit: Payer: PPO | Attending: Family Medicine | Admitting: Physical Therapy

## 2017-11-16 DIAGNOSIS — M25551 Pain in right hip: Secondary | ICD-10-CM | POA: Diagnosis not present

## 2017-11-16 DIAGNOSIS — R262 Difficulty in walking, not elsewhere classified: Secondary | ICD-10-CM

## 2017-11-16 DIAGNOSIS — M545 Low back pain, unspecified: Secondary | ICD-10-CM

## 2017-11-16 DIAGNOSIS — M6281 Muscle weakness (generalized): Secondary | ICD-10-CM | POA: Diagnosis not present

## 2017-11-16 DIAGNOSIS — R293 Abnormal posture: Secondary | ICD-10-CM | POA: Diagnosis not present

## 2017-11-16 NOTE — Therapy (Signed)
[place], [place], [address] Phone: [phone]   Fax:  [phone]  Physical Therapy Treatment  Patient Details  Name: Jesus Kim MRN: [id number] Date of Birth: 1924-12-16 Referring Provider (PT): Dr. Hulan Saas   Encounter Date: 11/16/2017  PT End of Session - 11/16/17 1207    Visit Number  3    Number of Visits  12    Date for PT Re-Evaluation  12/30/17    PT Start Time  1147    PT Stop Time  1240    PT Time Calculation (min)  53 min    Activity Tolerance  Patient tolerated treatment well    Behavior During Therapy  [facility] for tasks assessed/performed       Past Medical History:  Diagnosis Date  . Arthritis   . Cancer [facility])    Colon  . Hyperlipidemia   . Hypertension   . Melanoma [facility])     Past Surgical History:  Procedure Laterality Date  . BALLOON DILATION N/A 04/02/2015   Procedure: BALLOON DILATION;  Surgeon: Bjorn Loser, MD;  Location: [facility];  Service: Urology;  Laterality: N/A;  . CATARACT EXTRACTION    . COLON SURGERY     1981 - colon cancer  . CYSTOSCOPY WITH RETROGRADE PYELOGRAM, URETEROSCOPY AND STENT PLACEMENT N/A 04/02/2015   Procedure: CYSTO WITH RETROGRADE ;  Surgeon: Bjorn Loser, MD;  [facility];  Service: Urology;  Laterality: N/A;  . Left thub surgery    . TONSILLECTOMY    . URETHRAL STRICTURE DILATATION      There were no vitals filed for this visit.  Subjective Assessment - 11/16/17 1156    Subjective  Ache in Rt low back 5/10 when I get up.  Happens when I get out of bed.     Currently in Pain?  Yes    Pain Score  5     Pain Location  Back    Pain Orientation  Right    Pain Descriptors / Indicators  Aching    Pain Type  Acute pain    Pain Onset  More than a month ago    Pain Frequency  Intermittent           [facility] Adult PT Treatment/Exercise - 11/16/17 0001      Self-Care   Self-Care  Posture;Heat/Ice Application;Other Self-Care  Comments    Posture  gait     Heat/Ice Application  MHP to relax mm    Other Self-Care Comments   QL, muscle vs nerve pain , HEP       Lumbar Exercises: Stretches   Active Hamstring Stretch  Right;3 reps;30 seconds    Single Knee to Chest Stretch  3 reps;30 seconds    Single Knee to Chest Stretch Limitations  knee to opp shoulder     Lower Trunk Rotation  10 seconds    Lower Trunk Rotation Limitations  x 10     Pelvic Tilt  10 reps    Pelvic Tilt Limitations  cues     Other Lumbar Stretch Exercise  knee to chest with rotation for Rt hip/back opening x 5     ITB Stretch  Both;2 reps      Lumbar Exercises: Quadruped   Other Quadruped Lumbar Exercises  childs pose only felt L post knee pain       Knee/Hip Exercises: Sidelying   Hip ABduction  Strengthening;Both;1 set;15 reps   much more difficult on Rt side  Clams  x 20      Moist Heat Therapy   Number Minutes Moist Heat  10 Minutes    Moist Heat Location  Lumbar Spine      Manual Therapy   Soft tissue mobilization  Rt Quadratus lumborum, lumabr paraspinals     Myofascial Release  Rt trunk done briefly during prolonged sidelying stretch                   PT Long Term Goals - 11/16/17 1235      PT LONG TERM GOAL #1   Title  Pt will be able to show Independence with HEP    Baseline  up to date     Status  On-going      PT LONG TERM GOAL #2   Title  Pt will be able to wake in the AM with 50% less stiffness in his back, R hip     Status  On-going      PT LONG TERM GOAL #3   Title  Pt will improve his FOTO score to less than 40% impaired.      Status  On-going      PT LONG TERM GOAL #4   Title  Pt will be able to walk for 30 min and have no increased Rt sided low back pain     Status  On-going      PT LONG TERM GOAL #5   Title  Pt will increase Rt hip abd/glute med  strength to 4+/5 to improve gait and pelvic stability     Status  On-going            Plan - 11/16/17 1157    Clinical Impression  Statement  Patient with many questions about his HEP.  Able to isolate pain in Rt low back with sidelying stretch.  Weak in hip abd R>L.  Pain 90% better after he stood up post session (prior to heat).     PT Treatment/Interventions  ADLs/Self Care Home Management;Patient/family education;Functional mobility training;Iontophoresis 4mg /ml Dexamethasone;Moist Heat;Therapeutic activities    PT Next Visit Plan  check HEP and ensure technique , manual to Rt hip, [place]?    PT Home Exercise Plan  piriformis (sitting works better for him), hamstring, ITB, hip abd, clam , Rt QL     Consulted and Agree with Plan of Care  Patient       Patient will benefit from skilled therapeutic intervention in order to improve the following deficits and impairments:  Abnormal gait, Increased fascial restricitons, Difficulty walking, Decreased range of motion, Decreased endurance, Pain, Decreased balance, Impaired flexibility, Hypomobility, Postural dysfunction, Decreased strength, Decreased mobility  Visit Diagnosis: Pain in right hip  Muscle weakness (generalized)  Abnormal posture  Acute bilateral low back pain without sciatica  Difficulty in walking, not elsewhere classified     Problem List Patient Active Problem List   Diagnosis Date Noted  . Gluteal tendinitis of right buttock 10/13/2017  . Right hip pain 09/16/2017  . Right knee sprain 02/27/2015    Jesus Kim 11/16/2017, 12:46 PM  [facility] [address] [place], [place], [address] Phone: [phone]   Fax:  [phone]  Name: Jesus Kim MRN: [id number] Date of Birth: [birth year]   Raeford Razor, PT 11/16/17 12:46 PM Phone: [phone] Fax: [phone]

## 2017-11-19 ENCOUNTER — Encounter

## 2017-11-23 ENCOUNTER — Ambulatory Visit: Payer: PPO | Admitting: Physical Therapy

## 2017-11-23 ENCOUNTER — Encounter: Payer: Self-pay | Admitting: Physical Therapy

## 2017-11-23 DIAGNOSIS — R262 Difficulty in walking, not elsewhere classified: Secondary | ICD-10-CM

## 2017-11-23 DIAGNOSIS — M25551 Pain in right hip: Secondary | ICD-10-CM | POA: Diagnosis not present

## 2017-11-23 DIAGNOSIS — M545 Low back pain, unspecified: Secondary | ICD-10-CM

## 2017-11-23 DIAGNOSIS — R293 Abnormal posture: Secondary | ICD-10-CM

## 2017-11-23 DIAGNOSIS — M6281 Muscle weakness (generalized): Secondary | ICD-10-CM

## 2017-11-23 NOTE — Therapy (Signed)
[place], [place], [address] Phone: [phone]   Fax:  [phone]  Physical Therapy Treatment  Patient Details  Name: Jesus Kim MRN: [id number] Date of Birth: 1924-02-28 Referring Provider (PT): Dr. Hulan Saas   Encounter Date: 11/23/2017  PT End of Session - 11/23/17 1509    Visit Number  4    Number of Visits  12    Date for PT Re-Evaluation  12/30/17    PT Start Time  1504    PT Stop Time  1553    PT Time Calculation (min)  49 min    Activity Tolerance  Patient tolerated treatment well    Behavior During Therapy  [facility] for tasks assessed/performed       Past Medical History:  Diagnosis Date  . Arthritis   . Cancer [facility])    Colon  . Hyperlipidemia   . Hypertension   . Melanoma [facility])     Past Surgical History:  Procedure Laterality Date  . BALLOON DILATION N/A 04/02/2015   Procedure: BALLOON DILATION;  Surgeon: Bjorn Loser, MD;  [facility];  Service: Urology;  Laterality: N/A;  . CATARACT EXTRACTION    . COLON SURGERY     1981 - colon cancer  . CYSTOSCOPY WITH RETROGRADE PYELOGRAM, URETEROSCOPY AND STENT PLACEMENT N/A 04/02/2015   Procedure: CYSTO WITH RETROGRADE ;  Surgeon: Bjorn Loser, MD;  [facility];  Service: Urology;  Laterality: N/A;  . Left thub surgery    . TONSILLECTOMY    . URETHRAL STRICTURE DILATATION      There were no vitals filed for this visit.  Subjective Assessment - 11/23/17 1503    Subjective  3/10 in the Rt hip ASIS. Mostly when I have been sitting and go to stand up.      Currently in Pain?  Yes    Pain Score  3     Pain Location  Hip    Pain Orientation  Right    Pain Type  Acute pain    Pain Onset  More than a month ago    Pain Frequency  Intermittent    Aggravating Factors   used to feel it when he walked.  Now just when he stands     Pain Relieving Factors  sitting, laying down, stretching             [facility] Adult PT  Treatment/Exercise - 11/23/17 0001      Lumbar Exercises: Stretches   Active Hamstring Stretch  Right;3 reps;30 seconds      Lumbar Exercises: Supine   Ab Set  10 reps    Bridge with [employer]  10 reps    Bridge with [employer] Limitations  3 sets       Knee/Hip Exercises: Dipl[employer] Services operational officer  Both;2 reps    Hip Flexor Stretch  Right;1 rep;60 seconds;Other (comment)    Hip Flexor Stretch Limitations  with manual     ITB Stretch  Both;2 reps    Piriformis Stretch  Both;2 reps    Piriformis Stretch Limitations  seated       Knee/Hip Exercises: Standing   Hip Abduction  Stengthening;Both;1 set;20 reps;Knee straight    Hip Extension  Stengthening;Both;1 set;20 reps;Knee straight    Lateral Step Up  Both;1 set;Hand Hold: 1;Step Height: 6"      Knee/Hip Exercises: Seated   Sit to Sand  10 reps;without UE support  Knee/Hip Exercises: Sidelying   Hip ABduction  --      Moist Heat Therapy   Number Minutes Moist Heat  10 Minutes    Moist Heat Location  Lumbar Spine;Hip      Manual Therapy   Soft tissue mobilization  Rt rectus femoris      Myofascial Release  Rt trunk, glutes, lumbar                   PT Long Term Goals - 11/16/17 1235      PT LONG TERM GOAL #1   Title  Pt will be able to show Independence with HEP    Baseline  up to date     Status  On-going      PT LONG TERM GOAL #2   Title  Pt will be able to wake in the AM with 50% less stiffness in his back, R hip     Status  On-going      PT LONG TERM GOAL #3   Title  Pt will improve his FOTO score to less than 40% impaired.      Status  On-going      PT LONG TERM GOAL #4   Title  Pt will be able to walk for 30 min and have no increased Rt sided low back pain     Status  On-going      PT LONG TERM GOAL #5   Title  Pt will increase Rt hip abd/glute med  strength to 4+/5 to improve gait and pelvic stability     Status  On-going            Plan - 11/23/17 1510     Clinical Impression Statement  Patient is improving in both function and pain.  He no longer has pain with walking.  He has muscle tightness in Rt anterior hip, rectus femoris and some lateral hip weakness.  Doing exercises regularly and attending yoga.      PT Treatment/Interventions  ADLs/Self Care Home Management;Patient/family education;Functional mobility training;Iontophoresis 4mg /ml Dexamethasone;Moist Heat;Therapeutic activities    PT Next Visit Plan  check HEP and ensure technique , manual to Rt hip, [place]?    PT Home Exercise Plan  piriformis (sitting works better for him), hamstring, ITB, hip abd, clam , Rt QL stretch, bridge     Consulted and Agree with Plan of Care  Patient       Patient will benefit from skilled therapeutic intervention in order to improve the following deficits and impairments:  Abnormal gait, Increased fascial restricitons, Difficulty walking, Decreased range of motion, Decreased endurance, Pain, Decreased balance, Impaired flexibility, Hypomobility, Postural dysfunction, Decreased strength, Decreased mobility  Visit Diagnosis: Pain in right hip  Muscle weakness (generalized)  Abnormal posture  Acute bilateral low back pain without sciatica  Difficulty in walking, not elsewhere classified     Problem List Patient Active Problem List   Diagnosis Date Noted  . Gluteal tendinitis of right buttock 10/13/2017  . Right hip pain 09/16/2017  . Right knee sprain 02/27/2015    [name] 11/23/2017, 6:10 PM  [facility] [address] [place], [place], [address] Phone: [phone]   Fax:  [phone]  Name: Jesus Kim MRN: [id number] Date of Birth: 1924/11/29

## 2017-11-24 ENCOUNTER — Ambulatory Visit: Payer: PPO | Admitting: Family Medicine

## 2017-11-25 ENCOUNTER — Ambulatory Visit: Payer: PPO | Admitting: Physical Therapy

## 2017-11-25 ENCOUNTER — Encounter: Payer: Self-pay | Admitting: Physical Therapy

## 2017-11-25 DIAGNOSIS — M6281 Muscle weakness (generalized): Secondary | ICD-10-CM

## 2017-11-25 DIAGNOSIS — M545 Low back pain, unspecified: Secondary | ICD-10-CM

## 2017-11-25 DIAGNOSIS — R293 Abnormal posture: Secondary | ICD-10-CM

## 2017-11-25 DIAGNOSIS — R262 Difficulty in walking, not elsewhere classified: Secondary | ICD-10-CM

## 2017-11-25 DIAGNOSIS — M25551 Pain in right hip: Secondary | ICD-10-CM | POA: Diagnosis not present

## 2017-11-25 NOTE — Therapy (Signed)
[place], [place], [address] Phone: [phone]   Fax:  [phone]  Physical Therapy Treatment  Patient Details  Name: DINH AYOTTE MRN: [id number] Date of Birth: 12-29-1924 Referring Provider (PT): Dr. Hulan Saas   Encounter Date: 11/25/2017  PT End of Session - 11/25/17 1517    Visit Number  5    Number of Visits  12    Date for PT Re-Evaluation  12/30/17    PT Start Time  1503    PT Stop Time  1555    PT Time Calculation (min)  52 min    Activity Tolerance  Patient tolerated treatment well    Behavior During Therapy  [facility] [place] [facility] for tasks assessed/performed       Past Medical History:  Diagnosis Date  . Arthritis   . Cancer [facility])    Colon  . Hyperlipidemia   . Hypertension   . Melanoma [facility])     Past Surgical History:  Procedure Laterality Date  . BALLOON DILATION N/A 04/02/2015   Procedure: BALLOON DILATION;  Surgeon: Bjorn Loser, MD;  [facility];  Service: Urology;  Laterality: N/A;  . CATARACT EXTRACTION    . COLON SURGERY     1981 - colon cancer  . CYSTOSCOPY WITH RETROGRADE PYELOGRAM, URETEROSCOPY AND STENT PLACEMENT N/A 04/02/2015   Procedure: CYSTO WITH RETROGRADE ;  Surgeon: Bjorn Loser, MD;  [facility];  Service: Urology;  Laterality: N/A;  . Left thub surgery    . TONSILLECTOMY    . URETHRAL STRICTURE DILATATION      There were no vitals filed for this visit.  Subjective Assessment - 11/25/17 1517    Subjective  No new complaints.           [place] Adult PT Treatment/Exercise - 11/25/17 0001      Lumbar Exercises: Stretches   Active Hamstring Stretch  Both;3 reps;30 seconds    ITB Stretch  Both;3 reps;30 seconds    Piriformis Stretch  Both;3 reps;30 seconds      Lumbar Exercises: Aerobic   Nustep  L5 Ue and LE for 9 min       Knee/Hip Exercises: Standing   Lateral Step Up  Both;1 set;Hand Hold: 1;Step Height: 6"    Other Standing Knee Exercises   standing on balance pad arms overhead and rotation x 10       Knee/Hip Exercises: Seated   Sit to Sand  10 reps;without UE support then squat x 15 no UE support      Knee/Hip Exercises: Sidelying   Hip ABduction  Strengthening;Both;1 set;15 reps   much more difficult on Rt side    Clams  x 20      Moist Heat Therapy   Number Minutes Moist Heat  10 Minutes    Moist Heat Location  Lumbar Spine;Hip                  PT Long Term Goals - 11/16/17 1235      PT LONG TERM GOAL #1   Title  Pt will be able to show Independence with HEP    Baseline  up to date     Status  On-going      PT LONG TERM GOAL #2   Title  Pt will be able to wake in the AM with 50% less stiffness in his back, R hip     Status  On-going      PT LONG TERM  GOAL #3   Title  Pt will improve his FOTO score to less than 40% impaired.      Status  On-going      PT LONG TERM GOAL #4   Title  Pt will be able to walk for 30 min and have no increased Rt sided low back pain     Status  On-going      PT LONG TERM GOAL #5   Title  Pt will increase Rt hip abd/glute med  strength to 4+/5 to improve gait and pelvic stability     Status  On-going            Plan - 11/25/17 1519    Clinical Impression Statement  Patient continues to improve with pain and function.  Has good balance and works hard.  Needs cues to slow pace and use core to maintain stability.      PT Treatment/Interventions  ADLs/Self Care Home Management;Patient/family education;Functional mobility training;Iontophoresis 4mg /ml Dexamethasone;Moist Heat;Therapeutic activities    PT Next Visit Plan  check HEP and ensure technique , manual to Rt hip, [place]?    PT Home Exercise Plan  piriformis (sitting works better for him), hamstring, ITB, hip abd, clam , Rt QL stretch, bridge     Consulted and Agree with Plan of Care  Patient       Patient will benefit from skilled therapeutic intervention in order to improve the following deficits and  impairments:  Abnormal gait, Increased fascial restricitons, Difficulty walking, Decreased range of motion, Decreased endurance, Pain, Decreased balance, Impaired flexibility, Hypomobility, Postural dysfunction, Decreased strength, Decreased mobility  Visit Diagnosis: Pain in right hip  Muscle weakness (generalized)  Abnormal posture  Acute bilateral low back pain without sciatica  Difficulty in walking, not elsewhere classified     Problem List Patient Active Problem List   Diagnosis Date Noted  . Gluteal tendinitis of right buttock 10/13/2017  . Right hip pain 09/16/2017  . Right knee sprain 02/27/2015    Jaeda Bruso 11/25/2017, 4:12 PM  [facility] [address] [place], [place], [address] Phone: [phone]   Fax:  [phone]  Name: SANTHOSH GULINO MRN: [id number] Date of Birth: [birth year]  Raeford Razor, PT 11/25/17 4:13 PM Phone: [phone] Fax: [phone]

## 2017-11-30 ENCOUNTER — Ambulatory Visit: Payer: PPO | Admitting: Physical Therapy

## 2017-11-30 DIAGNOSIS — R293 Abnormal posture: Secondary | ICD-10-CM

## 2017-11-30 DIAGNOSIS — M545 Low back pain, unspecified: Secondary | ICD-10-CM

## 2017-11-30 DIAGNOSIS — R262 Difficulty in walking, not elsewhere classified: Secondary | ICD-10-CM

## 2017-11-30 DIAGNOSIS — M6281 Muscle weakness (generalized): Secondary | ICD-10-CM

## 2017-11-30 DIAGNOSIS — M25551 Pain in right hip: Secondary | ICD-10-CM

## 2017-11-30 NOTE — Patient Instructions (Signed)
Row: High - Standing    With yellow band anchored high, pull elbows backward, squeezing shoulder blades together. Keep head and spine neutral. Row _10-20__ times, __3_ times per week  http://ss.exer.us/292   Copyright  VHI. All rights reserved.  Resisted Horizontal Abduction: Bilateral    Sit or stand, tubing in both hands, arms out in front. Keeping arms straight, pinch shoulder blades together and stretch arms out. Repeat ___10-20_ times per set. Do ___1_ sets per session. Do ___3_ sessions per week   http://orth.exer.us/968   Copyright  VHI. All rights reserved.

## 2017-11-30 NOTE — Therapy (Signed)
[place], [place], [address] Phone: [phone]   Fax:  [phone]  Physical Therapy Treatment  Patient Details  Name: Jesus Kim MRN: [id number] Date of Birth: 1924/05/26 Referring Provider (PT): Dr. Hulan Saas   Encounter Date: 11/30/2017  PT End of Session - 11/30/17 1543    Visit Number  6    Number of Visits  12    Date for PT Re-Evaluation  12/30/17    PT Start Time  1500    PT Stop Time  1550    PT Time Calculation (min)  50 min    Activity Tolerance  Patient tolerated treatment well    Behavior During Therapy  [facility] for tasks assessed/performed       Past Medical History:  Diagnosis Date  . Arthritis   . Cancer [facility])    Colon  . Hyperlipidemia   . Hypertension   . Melanoma [facility]     Past Surgical History:  Procedure Laterality Date  . BALLOON DILATION N/A 04/02/2015   Procedure: BALLOON DILATION;  Surgeon: Bjorn Loser, MD;  [facility];  Service: Urology;  Laterality: N/A;  . CATARACT EXTRACTION    . COLON SURGERY     1981 - colon cancer  . CYSTOSCOPY WITH RETROGRADE PYELOGRAM, URETEROSCOPY AND STENT PLACEMENT N/A 04/02/2015   Procedure: CYSTO WITH RETROGRADE ;  Surgeon: Bjorn Loser, MD;  [facility];  Service: Urology;  Laterality: N/A;  . Left thub surgery    . TONSILLECTOMY    . URETHRAL STRICTURE DILATATION      There were no vitals filed for this visit.  Subjective Assessment - 11/30/17 2138    Subjective  I'm a 1/10.  Still has back soreness when he stands after sitting awhile.  Had alot of soreness yesterday.  " is it the weather?"         [facility] Adult PT Treatment/Exercise - 11/30/17 0001      Self-Care   Posture  use a lumbar support    Other Self-Care Comments   POC, HEP, standing core , weather and arthritis,      Lumbar Exercises: Stretches   Other Lumbar Stretch Exercise  seated lumbar flexion hands on ball       Lumbar Exercises:  Standing   Functional Squats  10 reps    Functional Squats Limitations  cues     Row  Strengthening;Both;20 reps;Theraband    Theraband Level (Row)  Level 4 (Blue)    Shoulder ADduction  Strengthening;Both;15 reps;Theraband    Theraband Level (Shoulder Adduction)  Level 4 (Blue)      Lumbar Exercises: Seated   Other Seated Lumbar Exercises  on disc, march alternating LEs, upper trunk twist and horizontal pull green x 10       Lumbar Exercises: Supine   Pelvic Tilt  10 reps    Bent Knee Raise  10 reps    Straight Leg Raise  15 reps      Lumbar Exercises: Quadruped   Other Quadruped Lumbar Exercises  sink stretch x 5       Moist Heat Therapy   Number Minutes Moist Heat  10 Minutes    Moist Heat Location  Lumbar Spine;Hip             PT Education - 11/30/17 1543    Education Details  HEP for upper body /core , POC     Person(s) Educated  Patient    Methods  Explanation;Handout    Comprehension  Verbalized understanding;Returned demonstration          PT Long Term Goals - 11/16/17 1235      PT LONG TERM GOAL #1   Title  Pt will be able to show Independence with HEP    Baseline  up to date     Status  On-going      PT LONG TERM GOAL #2   Title  Pt will be able to wake in the AM with 50% less stiffness in his back, R hip     Status  On-going      PT LONG TERM GOAL #3   Title  Pt will improve his FOTO score to less than 40% impaired.      Status  On-going      PT LONG TERM GOAL #4   Title  Pt will be able to walk for 30 min and have no increased Rt sided low back pain     Status  On-going      PT LONG TERM GOAL #5   Title  Pt will increase Rt hip abd/glute med  strength to 4+/5 to improve gait and pelvic stability     Status  On-going            Plan - 11/30/17 1544    Clinical Impression Statement  Cont to have 1/10 at worst and some low back soreness with transitional movements.  He has 1 more visit and will be DC.      PT Treatment/Interventions   ADLs/Self Care Home Management;Patient/family education;Functional mobility training;Iontophoresis 4mg /ml Dexamethasone;Moist Heat;Therapeutic activities    PT Next Visit Plan  FOTO, full HEP, DC . Goals , measure    PT Home Exercise Plan  piriformis (sitting works better for him), hamstring, ITB, hip abd, clam , Rt QL stretch, bridge , row and horiz abd green    Consulted and Agree with Plan of Care  Patient       Patient will benefit from skilled therapeutic intervention in order to improve the following deficits and impairments:  Abnormal gait, Increased fascial restricitons, Difficulty walking, Decreased range of motion, Decreased endurance, Pain, Decreased balance, Impaired flexibility, Hypomobility, Postural dysfunction, Decreased strength, Decreased mobility  Visit Diagnosis: Pain in right hip  Muscle weakness (generalized)  Abnormal posture  Difficulty in walking, not elsewhere classified  Acute bilateral low back pain without sciatica     Problem List Patient Active Problem List   Diagnosis Date Noted  . Gluteal tendinitis of right buttock 10/13/2017  . Right hip pain 09/16/2017  . Right knee sprain 02/27/2015    Havannah Streat 11/30/2017, 9:41 PM  [facility] [address] [place], [place], [address] Phone: [phone]   Fax:  [phone]  Name: Jesus Kim MRN: [id number] Date of Birth: Oct 02, 1924   Raeford Razor, PT 11/30/17 9:41 PM Phone: [phone] Fax: [phone]

## 2017-12-02 ENCOUNTER — Ambulatory Visit: Payer: PPO | Admitting: Physical Therapy

## 2017-12-02 ENCOUNTER — Encounter: Payer: Self-pay | Admitting: Physical Therapy

## 2017-12-02 DIAGNOSIS — M6281 Muscle weakness (generalized): Secondary | ICD-10-CM

## 2017-12-02 DIAGNOSIS — M545 Low back pain, unspecified: Secondary | ICD-10-CM

## 2017-12-02 DIAGNOSIS — R293 Abnormal posture: Secondary | ICD-10-CM

## 2017-12-02 DIAGNOSIS — R262 Difficulty in walking, not elsewhere classified: Secondary | ICD-10-CM

## 2017-12-02 DIAGNOSIS — M25551 Pain in right hip: Secondary | ICD-10-CM

## 2017-12-02 NOTE — Therapy (Signed)
[place], [place], [address] Phone: [phone]   Fax:  [phone]  Physical Therapy Treatment/Discharge  Patient Details  Name: Jesus Kim MRN: [id number] Date of Birth: 05/27/1924 Referring Provider (PT): Dr. Hulan Saas   Encounter Date: 12/02/2017  PT End of Session - 12/02/17 1529    Visit Number  7    Number of Visits  12    Date for PT Re-Evaluation  12/30/17    PT Start Time  1500    PT Stop Time  1553    PT Time Calculation (min)  53 min    Activity Tolerance  Patient tolerated treatment well    Behavior During Therapy  [facility] for tasks assessed/performed       Past Medical History:  Diagnosis Date  . Arthritis   . Cancer [facility])    Colon  . Hyperlipidemia   . Hypertension   . Melanoma [facility])     Past Surgical History:  Procedure Laterality Date  . BALLOON DILATION N/A 04/02/2015   Procedure: BALLOON DILATION;  Surgeon: Bjorn Loser, MD;  Location: [facility];  Service: Urology;  Laterality: N/A;  . CATARACT EXTRACTION    . COLON SURGERY     1981 - colon cancer  . CYSTOSCOPY WITH RETROGRADE PYELOGRAM, URETEROSCOPY AND STENT PLACEMENT N/A 04/02/2015   Procedure: CYSTO WITH RETROGRADE ;  Surgeon: Bjorn Loser, MD;  Location: [facility];  Service: Urology;  Laterality: N/A;  . Left thub surgery    . TONSILLECTOMY    . URETHRAL STRICTURE DILATATION      There were no vitals filed for this visit.  Subjective Assessment - 12/02/17 1508    Subjective  Its my last day, barely there today.  It only hurts when I get out of the lounge chair.      Currently in Pain?  No/denies         [facility] PT Assessment - 12/02/17 0001      AROM   Lumbar Flexion  WFL    Lumbar Extension  WFL    Lumbar - Right Side Bend  WFL    Lumbar - Left Side Bend  WFL    Lumbar - Right Rotation  WFL    Lumbar - Left Rotation  [facility]       Strength   Right Hip ABduction  4+/5          [facility] Adult PT  Treatment/Exercise - 12/02/17 0001      Self-Care   Other Self-Care Comments   HEP variations, cues , DC , FOTO       Lumbar Exercises: Stretches   Other Lumbar Stretch Exercise  sink stretch 5 x 15 sec     Active Hamstring Stretch  Both;3 reps;30 seconds    ITB Stretch  Both;3 reps;30 seconds    Piriformis Stretch  Both;2 reps    Piriformis Stretch Limitations  seated       Lumbar Exercises: Aerobic   Nustep  L7 UE and LE for 7 min       Lumbar Exercises: Standing   Row  Strengthening;Both;20 reps;Theraband    Theraband Level (Row)  Level 3 (Green)    Other Standing Lumbar Exercises  horizontal abduction green x 10       Knee/Hip Exercises: Sidelying   Hip ABduction  Strengthening;Both;1 set;15 reps   much more difficult on Rt side    Clams  x 20      Moist Heat  Therapy   Number Minutes Moist Heat  10 Minutes    Moist Heat Location  Lumbar Spine             PT Education - 12/02/17 1529    Education Details  full HEP review    Person(s) Educated  Patient    Methods  Explanation    Comprehension  Verbalized understanding          PT Long Term Goals - 12/02/17 1529      PT LONG TERM GOAL #1   Title  Pt will be able to show Independence with HEP    Status  Achieved      PT LONG TERM GOAL #2   Title  Pt will be able to wake in the AM with 50% less stiffness in his back, R hip     Status  Achieved      PT LONG TERM GOAL #3   Title  Pt will improve his FOTO score to less than 40% impaired.      Baseline  18%    Status  Achieved      PT LONG TERM GOAL #4   Title  Pt will be able to walk for 30 min and have no increased Rt sided low back pain     Baseline  tired with 20 min no pain     Status  Partially Met      PT LONG TERM GOAL #5   Title  Pt will increase Rt hip abd/glute med  strength to 4+/5 to improve gait and pelvic stability     Status  Achieved            Plan - 12/02/17 1533    Clinical Impression Statement  Patient has met or  partially met all goals.  He has a residual soreness in lumbar spine when he sits too long.  Otherwise, very pleased with his progress.     PT Treatment/Interventions  ADLs/Self Care Home Management;Patient/family education;Functional mobility training;Iontophoresis 21m/ml Dexamethasone;Moist Heat;Therapeutic activities    PT Next Visit Plan  NA     PT Home Exercise Plan  piriformis (sitting works better for him), hamstring, ITB, hip abd, clam , Rt QL stretch, bridge , row and horiz abd green    Consulted and Agree with Plan of Care  Patient       Patient will benefit from skilled therapeutic intervention in order to improve the following deficits and impairments:  Abnormal gait, Increased fascial restricitons, Difficulty walking, Decreased range of motion, Decreased endurance, Pain, Decreased balance, Impaired flexibility, Hypomobility, Postural dysfunction, Decreased strength, Decreased mobility  Visit Diagnosis: Pain in right hip  Muscle weakness (generalized)  Abnormal posture  Difficulty in walking, not elsewhere classified  Acute bilateral low back pain without sciatica     Problem List Patient Active Problem List   Diagnosis Date Noted  . Gluteal tendinitis of right buttock 10/13/2017  . Right hip pain 09/16/2017  . Right knee sprain 02/27/2015    Sareena Odeh 12/02/2017, 3:46 PM  [facility] [address]Phone: [phone]  Fax:  [phone] Name: Jesus SCHNICKMRN: [id number] of Birth: [birth year] JRaeford Razor PT 12/02/17 3:46 PM Phone: [phone]: [phone]

## 2017-12-07 ENCOUNTER — Ambulatory Visit: Payer: PPO | Admitting: Physical Therapy

## 2017-12-07 NOTE — Progress Notes (Signed)
[facility] [place],  [address] Phone: [phone] Subjective:   [name], am serving as a scribe for Dr. Hulan Saas.  CC: Hip and back pain follow-up  MPN:[id number]  Jesus Kim is a 84 y.o. male coming in with complaint of hip pain. Patient is not having any hip pain. Did 7 sessions of physical therapy and has transitioned to HEP. He feels like he is 100%.     Past Medical History:  Diagnosis Date  . Arthritis   . Cancer [facility])    Colon  . Hyperlipidemia   . Hypertension   . Melanoma [facility])    Past Surgical History:  Procedure Laterality Date  . BALLOON DILATION N/A 04/02/2015   Procedure: BALLOON DILATION;  Surgeon: Bjorn Loser, MD;  [facility];  Service: Urology;  Laterality: N/A;  . CATARACT EXTRACTION    . COLON SURGERY     1981 - colon cancer  . CYSTOSCOPY WITH RETROGRADE PYELOGRAM, URETEROSCOPY AND STENT PLACEMENT N/A 04/02/2015   Procedure: CYSTO WITH RETROGRADE ;  Surgeon: Bjorn Loser, MD;  [facility];  Service: Urology;  Laterality: N/A;  . Left thub surgery    . TONSILLECTOMY    . URETHRAL STRICTURE DILATATION     Social History   Socioeconomic History  . Marital status: Married    Spouse name: Not on file  . Number of children: 3  . Years of education: 1  . Highest education level: Not on file  Occupational History  . Not on file  Social Needs  . Financial resource strain: Not on file  . Food insecurity:    Worry: Not on file    Inability: Not on file  . Transportation needs:    Medical: Not on file    Non-medical: Not on file  Tobacco Use  . Smoking status: Former Smoker    Last attempt to quit: 01/31/1977    Years since quitting: 40.8  . Smokeless tobacco: Never Used  Substance and Sexual Activity  . Alcohol use: Yes    Alcohol/week: 1.0 standard drinks    Types: 1 Glasses of wine per week  . Drug use: No  . Sexual activity: Not on file  Lifestyle  . Physical  activity:    Days per week: Not on file    Minutes per session: Not on file  . Stress: Not on file  Relationships  . Social connections:    Talks on phone: Not on file    Gets together: Not on file    Attends religious service: Not on file    Active member of club or organization: Not on file    Attends meetings of clubs or organizations: Not on file    Relationship status: Not on file  Other Topics Concern  . Not on file  Social History Narrative  . Not on file   Allergies  Allergen Reactions  . Sulfa Antibiotics Rash   No family history on file.   Current Outpatient Medications (Cardiovascular):  .  lisinopril (PRINIVIL,ZESTRIL) 20 MG tablet, Take 20 mg by mouth daily.  .  simvastatin (ZOCOR) 20 MG tablet, Take 20 mg by mouth daily.   Current Outpatient Medications (Analgesics):  .  acetaminophen (TYLENOL) 500 MG tablet, Take 500-1,000 mg by mouth every 6 (six) hours as needed for moderate pain. Marland Kitchen  aspirin 81 MG tablet, Take 81 mg by mouth daily. Marland Kitchen  HYDROcodone-acetaminophen (NORCO/VICODIN) 5-325 MG tablet, Take 1  tablet by mouth every 6 (six) hours as needed for moderate pain. (Patient not taking: Reported on 11/04/2017)   Current Outpatient Medications (Other):  .  diphenhydramine-acetaminophen (TYLENOL PM) 25-500 MG TABS tablet, Take 1 tablet by mouth at bedtime. .  finasteride (PROSCAR) 5 MG tablet, Take 5 mg by mouth daily. Evening .  gabapentin (NEURONTIN) 100 MG capsule, Take 1 capsule (100 mg total) by mouth at bedtime. .  Multiple Vitamins-Minerals (PRESERVISION AREDS 2+MULTI VIT PO), Take 2 tablets by mouth daily. Marland Kitchen  trimethoprim (TRIMPEX) 100 MG tablet, Take 100 mg by mouth daily.    Past medical history, social, surgical and family history all reviewed in electronic medical record.  No pertanent information unless stated regarding to the chief complaint.   Review of Systems:  No headache, visual changes, nausea, vomiting, diarrhea, constipation, dizziness,  abdominal pain, skin rash, fevers, chills, night sweats, weight loss, swollen lymph nodes, body aches, joint swelling, muscle aches, chest pain, shortness of breath, mood changes.   Objective  Blood pressure 132/76, pulse (!) 107, height 5\' 6"  (1.676 m), weight 170 lb (77.1 kg), SpO2 97 %.    General: No apparent distress alert and oriented x3 mood and affect normal, dressed appropriately.  HEENT: Pupils equal, extraocular movements intact  Respiratory: Patient's speak in full sentences and does not appear short of breath  Cardiovascular: No lower extremity edema, non tender, no erythema  Skin: Warm dry intact with no signs of infection or rash on extremities or on axial skeleton.  Abdomen: Soft nontender  Neuro: Cranial nerves II through XII are intact, neurovascularly intact in all extremities with 2+ DTRs and 2+ pulses.  Lymph: No lymphadenopathy of posterior or anterior cervical chain or axillae bilaterally.  Gait normal with good balance and coordination.  MSK:  Non tender with full range of motion and good stability and symmetric strength and tone of shoulders, elbows, wrist, hip, knee and ankles bilaterally.  Arthritic changes of multiple joints Core strength still somewhat poor.  Does have some limited range of motion in extension as well as lacks last 5 degrees of flexion.  Negative Faber's test and negative straight leg test.  Neurovascular intact with 5 out of 5 strength of the lower extremity    Impression and Recommendations:     The above documentation has been reviewed and is accurate and complete [name], DO       Note: This dictation was prepared with Dragon dictation along with smaller phrase technology. Any transcriptional errors that result from this process are unintentional.

## 2017-12-08 ENCOUNTER — Encounter: Payer: Self-pay | Admitting: Family Medicine

## 2017-12-08 ENCOUNTER — Ambulatory Visit (INDEPENDENT_AMBULATORY_CARE_PROVIDER_SITE_OTHER): Payer: PPO | Admitting: Family Medicine

## 2017-12-08 DIAGNOSIS — M7601 Gluteal tendinitis, right hip: Secondary | ICD-10-CM | POA: Diagnosis not present

## 2017-12-08 NOTE — Assessment & Plan Note (Signed)
Doing remarkably better at this time.  No significant change in management.  Likely some lumbar radiculopathy but will not do any more of the increase testing at the moment.  As long as patient is well can follow-up as needed

## 2017-12-15 DIAGNOSIS — E785 Hyperlipidemia, unspecified: Secondary | ICD-10-CM | POA: Diagnosis not present

## 2017-12-15 DIAGNOSIS — I10 Essential (primary) hypertension: Secondary | ICD-10-CM | POA: Diagnosis not present

## 2017-12-15 DIAGNOSIS — C439 Malignant melanoma of skin, unspecified: Secondary | ICD-10-CM | POA: Diagnosis not present

## 2017-12-15 DIAGNOSIS — R7303 Prediabetes: Secondary | ICD-10-CM | POA: Diagnosis not present

## 2017-12-15 DIAGNOSIS — N183 Chronic kidney disease, stage 3 (moderate): Secondary | ICD-10-CM | POA: Diagnosis not present

## 2017-12-17 DIAGNOSIS — Z85820 Personal history of malignant melanoma of skin: Secondary | ICD-10-CM | POA: Diagnosis not present

## 2017-12-17 DIAGNOSIS — L570 Actinic keratosis: Secondary | ICD-10-CM | POA: Diagnosis not present

## 2017-12-17 DIAGNOSIS — L821 Other seborrheic keratosis: Secondary | ICD-10-CM | POA: Diagnosis not present

## 2017-12-17 DIAGNOSIS — D034 Melanoma in situ of scalp and neck: Secondary | ICD-10-CM | POA: Diagnosis not present

## 2017-12-17 DIAGNOSIS — D0471 Carcinoma in situ of skin of right lower limb, including hip: Secondary | ICD-10-CM | POA: Diagnosis not present

## 2017-12-17 DIAGNOSIS — Z85828 Personal history of other malignant neoplasm of skin: Secondary | ICD-10-CM | POA: Diagnosis not present

## 2018-01-14 DIAGNOSIS — R3914 Feeling of incomplete bladder emptying: Secondary | ICD-10-CM | POA: Diagnosis not present

## 2018-01-14 DIAGNOSIS — N3020 Other chronic cystitis without hematuria: Secondary | ICD-10-CM | POA: Diagnosis not present

## 2018-01-24 DIAGNOSIS — Z6827 Body mass index (BMI) 27.0-27.9, adult: Secondary | ICD-10-CM | POA: Diagnosis not present

## 2018-01-24 DIAGNOSIS — N3289 Other specified disorders of bladder: Secondary | ICD-10-CM | POA: Diagnosis not present

## 2018-01-24 DIAGNOSIS — N184 Chronic kidney disease, stage 4 (severe): Secondary | ICD-10-CM | POA: Diagnosis not present

## 2018-01-24 DIAGNOSIS — D034 Melanoma in situ of scalp and neck: Secondary | ICD-10-CM | POA: Diagnosis not present

## 2018-01-24 DIAGNOSIS — Z85828 Personal history of other malignant neoplasm of skin: Secondary | ICD-10-CM | POA: Diagnosis not present

## 2018-01-24 DIAGNOSIS — I129 Hypertensive chronic kidney disease with stage 1 through stage 4 chronic kidney disease, or unspecified chronic kidney disease: Secondary | ICD-10-CM | POA: Diagnosis not present

## 2018-01-24 DIAGNOSIS — N35919 Unspecified urethral stricture, male, unspecified site: Secondary | ICD-10-CM | POA: Diagnosis not present

## 2018-02-11 DIAGNOSIS — N281 Cyst of kidney, acquired: Secondary | ICD-10-CM | POA: Diagnosis not present

## 2018-02-11 DIAGNOSIS — R3914 Feeling of incomplete bladder emptying: Secondary | ICD-10-CM | POA: Diagnosis not present

## 2018-02-11 DIAGNOSIS — N3020 Other chronic cystitis without hematuria: Secondary | ICD-10-CM | POA: Diagnosis not present

## 2018-02-11 DIAGNOSIS — R3121 Asymptomatic microscopic hematuria: Secondary | ICD-10-CM | POA: Diagnosis not present

## 2018-05-01 DIAGNOSIS — R300 Dysuria: Secondary | ICD-10-CM | POA: Diagnosis not present

## 2018-07-28 DIAGNOSIS — R7303 Prediabetes: Secondary | ICD-10-CM | POA: Diagnosis not present

## 2018-07-28 DIAGNOSIS — I129 Hypertensive chronic kidney disease with stage 1 through stage 4 chronic kidney disease, or unspecified chronic kidney disease: Secondary | ICD-10-CM | POA: Diagnosis not present

## 2018-07-28 DIAGNOSIS — E785 Hyperlipidemia, unspecified: Secondary | ICD-10-CM | POA: Diagnosis not present

## 2018-07-28 DIAGNOSIS — Z0000 Encounter for general adult medical examination without abnormal findings: Secondary | ICD-10-CM | POA: Diagnosis not present

## 2018-07-28 DIAGNOSIS — I10 Essential (primary) hypertension: Secondary | ICD-10-CM | POA: Diagnosis not present

## 2018-07-28 DIAGNOSIS — N189 Chronic kidney disease, unspecified: Secondary | ICD-10-CM | POA: Diagnosis not present

## 2018-08-17 DIAGNOSIS — I129 Hypertensive chronic kidney disease with stage 1 through stage 4 chronic kidney disease, or unspecified chronic kidney disease: Secondary | ICD-10-CM | POA: Diagnosis not present

## 2018-08-17 DIAGNOSIS — N35919 Unspecified urethral stricture, male, unspecified site: Secondary | ICD-10-CM | POA: Diagnosis not present

## 2018-08-17 DIAGNOSIS — N342 Other urethritis: Secondary | ICD-10-CM | POA: Diagnosis not present

## 2018-08-17 DIAGNOSIS — N184 Chronic kidney disease, stage 4 (severe): Secondary | ICD-10-CM | POA: Diagnosis not present

## 2018-08-17 DIAGNOSIS — E875 Hyperkalemia: Secondary | ICD-10-CM | POA: Diagnosis not present

## 2018-08-17 DIAGNOSIS — N3289 Other specified disorders of bladder: Secondary | ICD-10-CM | POA: Diagnosis not present

## 2018-10-28 DIAGNOSIS — N453 Epididymo-orchitis: Secondary | ICD-10-CM | POA: Diagnosis not present

## 2018-10-28 DIAGNOSIS — N3020 Other chronic cystitis without hematuria: Secondary | ICD-10-CM | POA: Diagnosis not present

## 2018-11-09 DIAGNOSIS — H5203 Hypermetropia, bilateral: Secondary | ICD-10-CM | POA: Diagnosis not present

## 2018-11-09 DIAGNOSIS — H353131 Nonexudative age-related macular degeneration, bilateral, early dry stage: Secondary | ICD-10-CM | POA: Diagnosis not present

## 2018-11-09 DIAGNOSIS — Z961 Presence of intraocular lens: Secondary | ICD-10-CM | POA: Diagnosis not present

## 2018-12-20 ENCOUNTER — Encounter: Payer: Self-pay | Admitting: Family Medicine

## 2018-12-20 ENCOUNTER — Other Ambulatory Visit: Payer: Self-pay

## 2018-12-20 ENCOUNTER — Ambulatory Visit: Payer: PPO | Admitting: Family Medicine

## 2018-12-20 ENCOUNTER — Ambulatory Visit: Payer: Self-pay

## 2018-12-20 VITALS — BP 132/80 | HR 111 | Ht 66.0 in | Wt 170.0 lb

## 2018-12-20 DIAGNOSIS — M7601 Gluteal tendinitis, right hip: Secondary | ICD-10-CM | POA: Diagnosis not present

## 2018-12-20 DIAGNOSIS — M25551 Pain in right hip: Secondary | ICD-10-CM | POA: Diagnosis not present

## 2018-12-20 DIAGNOSIS — M7050 Other bursitis of knee, unspecified knee: Secondary | ICD-10-CM | POA: Diagnosis not present

## 2018-12-20 NOTE — Assessment & Plan Note (Addendum)
repeat injection given December 20, 2018 patient has responded very well in the past.  We decided to be a little more aggressive secondary to the coronavirus.  Discussed icing regimen and home exercise, discussed topical anti-inflammatories.  Patient does have L4-L5 arthritic changes of the back that could be contributing to some of his pain as well.  Hopefully though this will be beneficial.  Follow-up again in 4 weeks.

## 2018-12-20 NOTE — Assessment & Plan Note (Signed)
Anserine bursitis noted.  Discussed home exercises and icing regimen, 5 compression sleeve given, discussed over-the-counter topical anti-inflammatories.  Increase activity slowly.  Follow-up again in 4 to 8 weeks

## 2018-12-20 NOTE — Progress Notes (Signed)
[name] [place], [place] [address] Phone: [phone] Subjective:   Jesus Kim, am serving as a scribe for Dr. Hulan Saas.  This visit occurred during the SARS-CoV-2 public health emergency.  Safety protocols were in place, including screening questions prior to the visit, additional usage of staff PPE, and extensive cleaning of exam room while observing appropriate contact time as indicated for disinfecting solutions.   I'm seeing this patient by the request  of:    This visit occurred during the SARS-CoV-2 public health emergency.  Safety protocols were in place, including screening questions prior to the visit, additional usage of staff PPE, and extensive cleaning of exam room while observing appropriate contact time as indicated for disinfecting solutions.     CC: left knee pain   VOP:[id number]   12/08/2017 Doing remarkably better at this time.  Kim significant change in management.  Likely some lumbar radiculopathy but will not do any more of the increase testing at the moment.  As long as patient is well can follow-up as needed   Update 12/20/2018 Jesus Kim is a 85 y.o. male coming in with complaint of right knee pain. Last seen for glute tendonitis in November of last year. Patient states he was out for a walk 3 weeks ago when he developed left knee pain. Notes swelling and pain over medial aspect. Pain increases with walking. Right hip and left calf are also bothering him due to his gait.      Past Medical History:  Diagnosis Date  . Arthritis   . Cancer [facility])    Colon  . Hyperlipidemia   . Hypertension   . Melanoma [facility])    Past Surgical History:  Procedure Laterality Date  . BALLOON DILATION N/A 04/02/2015   Procedure: BALLOON DILATION;  Surgeon: Bjorn Loser, MD;  [facility];  Service: Urology;  Laterality: N/A;  . CATARACT EXTRACTION    . COLON SURGERY     1981 - colon cancer  . CYSTOSCOPY WITH  RETROGRADE PYELOGRAM, URETEROSCOPY AND STENT PLACEMENT N/A 04/02/2015   Procedure: CYSTO WITH RETROGRADE ;  Surgeon: Bjorn Loser, MD;  [facility];  Service: Urology;  Laterality: N/A;  . Left thub surgery    . TONSILLECTOMY    . URETHRAL STRICTURE DILATATION     Social History   Socioeconomic History  . Marital status: Married    Spouse name: Not on file  . Number of children: 3  . Years of education: 57  . Highest education level: Not on file  Occupational History  . Not on file  Social Needs  . Financial resource strain: Not on file  . Food insecurity    Worry: Not on file    Inability: Not on file  . Transportation needs    Medical: Not on file    Non-medical: Not on file  Tobacco Use  . Smoking status: Former Smoker    Quit date: 01/31/1977    Years since quitting: 41.9  . Smokeless tobacco: Never Used  Substance and Sexual Activity  . Alcohol use: Yes    Alcohol/week: 1.0 standard drinks    Types: 1 Glasses of wine per week  . Drug use: Kim  . Sexual activity: Not on file  Lifestyle  . Physical activity    Days per week: Not on file    Minutes per session: Not on file  . Stress: Not on file  Relationships  . Social connections  Talks on phone: Not on file    Gets together: Not on file    Attends religious service: Not on file    Active member of club or organization: Not on file    Attends meetings of clubs or organizations: Not on file    Relationship status: Not on file  Other Topics Concern  . Not on file  Social History Narrative  . Not on file   Allergies  Allergen Reactions  . Sulfa Antibiotics Rash   Kim family history on file.   Current Outpatient Medications (Cardiovascular):  .  lisinopril (PRINIVIL,ZESTRIL) 20 MG tablet, Take 20 mg by mouth daily.  .  simvastatin (ZOCOR) 20 MG tablet, Take 20 mg by mouth daily.   Current Outpatient Medications (Analgesics):  .  acetaminophen (TYLENOL) 500 MG tablet, Take 500-1,000 mg by mouth  every 6 (six) hours as needed for moderate pain. Marland Kitchen  HYDROcodone-acetaminophen (NORCO/VICODIN) 5-325 MG tablet, Take 1 tablet by mouth every 6 (six) hours as needed for moderate pain. Marland Kitchen  aspirin 81 MG tablet, Take 81 mg by mouth daily.   Current Outpatient Medications (Other):  .  diphenhydramine-acetaminophen (TYLENOL PM) 25-500 MG TABS tablet, Take 1 tablet by mouth at bedtime. .  finasteride (PROSCAR) 5 MG tablet, Take 5 mg by mouth daily. Evening .  Multiple Vitamins-Minerals (PRESERVISION AREDS 2+MULTI VIT PO), Take 2 tablets by mouth daily. Marland Kitchen  trimethoprim (TRIMPEX) 100 MG tablet, Take 100 mg by mouth daily. Marland Kitchen  gabapentin (NEURONTIN) 100 MG capsule, Take 1 capsule (100 mg total) by mouth at bedtime.    Past medical history, social, surgical and family history all reviewed in electronic medical record.  Kim pertanent information unless stated regarding to the chief complaint.   Review of Systems:  Kim headache, visual changes, nausea, vomiting, diarrhea, constipation, dizziness, abdominal pain, skin rash, fevers, chills, night sweats, weight loss, swollen lymph nodes, body aches, joint swelling, muscle aches, chest pain, shortness of breath, mood changes.   Objective  Blood pressure 132/80, pulse (!) 111, height 5\' 6"  (1.676 m), weight 170 lb (77.1 kg), SpO2 97 %.    General: Kim apparent distress alert and oriented x3 mood and affect normal, dressed appropriately.  HEENT: Pupils equal, extraocular movements intact  Respiratory: Patient's speak in full sentences and does not appear short of breath  Cardiovascular: Kim lower extremity edema, non tender, Kim erythema  Skin: Warm dry intact with Kim signs of infection or rash on extremities or on axial skeleton.  Abdomen: Soft nontender  Neuro: Cranial nerves II through XII are intact, neurovascularly intact in all extremities with 2+ DTRs and 2+ pulses.  Lymph: Kim lymphadenopathy of posterior or anterior cervical chain or axillae  bilaterally.  Gait normal with good balance and coordination.  MSK:  Non tender with full range of motion and good stability and symmetric strength and tone of shoulders, elbows, wrist,  and ankles bilaterally.  Left knee exam shows some mild arthritic changes but does have swelling more over the medial aspect of the knee.  Severely tender to palpation inferior to the joint line.  Worsening pain with extension of the leg not as much as extension of the knee itself.  Neurovascular intact distally.  Increasing pain with resisted flexion of the knee.  Limited musculoskeletal ultrasound was performed and interpreted by [name]  Limited ultrasound of patient's left knee shows that there is some tearing noted of the distal hamstring.  Kim avulsion though noted.  Procedure: Real-time Ultrasound Guided  Injection of right gluteal tendon sheath Device: GE Logiq Q7 Ultrasound guided injection is preferred based studies that show increased duration, increased effect, greater accuracy, decreased procedural pain, increased response rate, and decreased cost with ultrasound guided versus blind injection.  Verbal informed consent obtained.  Time-out conducted.  Noted Kim overlying erythema, induration, or other signs of local infection.  Skin prepped in a sterile fashion.  Local anesthesia: Topical Ethyl chloride.  With sterile technique and under real time ultrasound guidance: With a 21-gauge 2 inch needle injected with 0.5 cc of 0.5% Marcaine and 1 cc of Kenalog 40 mg/mL into the gluteal tendon sheath. Completed without difficulty  Pain immediately resolved suggesting accurate placement of the medication.  Advised to call if fevers/chills, erythema, induration, drainage, or persistent bleeding.  Images permanently stored and available for review in the ultrasound unit.  Impression: Technically successful ultrasound guided injection.    Impression and Recommendations:     This case required medical  decision making of moderate complexity. The above documentation has been reviewed and is accurate and complete [name], DO       Note: This dictation was prepared with Dragon dictation along with smaller phrase technology. Any transcriptional errors that result from this process are unintentional.

## 2018-12-20 NOTE — Patient Instructions (Addendum)
  [address], [address] [place], [place] [address] Phone [phone]  Voltaren gel 2x daily Exercises 3x a week Injected the hip today See me again in 4 weeks

## 2018-12-21 ENCOUNTER — Encounter: Payer: Self-pay | Admitting: Family Medicine

## 2019-01-18 ENCOUNTER — Encounter: Payer: Self-pay | Admitting: Family Medicine

## 2019-01-19 ENCOUNTER — Ambulatory Visit: Payer: PPO | Admitting: Family Medicine

## 2019-02-09 ENCOUNTER — Ambulatory Visit: Payer: PPO

## 2019-02-17 ENCOUNTER — Ambulatory Visit: Payer: PPO | Attending: Internal Medicine

## 2019-02-17 DIAGNOSIS — Z23 Encounter for immunization: Secondary | ICD-10-CM

## 2019-02-17 NOTE — Progress Notes (Signed)
   Covid-19 Vaccination Clinic  Name:  Jesus Kim    MRN: [id number] DOB: August 25, 1924  02/17/2019  Mr. Jesus Kim was observed post Covid-19 immunization for 15 minutes without incidence. He was provided with Vaccine Information Sheet and instruction to access the V-Safe system.   Mr. Jesus Kim was instructed to call 911 with any severe reactions post vaccine: Marland Kitchen Difficulty breathing  . Swelling of your face and throat  . A fast heartbeat  . A bad rash all over your body  . Dizziness and weakness    Immunizations Administered    Name Date Dose VIS Date Route   Pfizer COVID-19 Vaccine 02/17/2019  4:09 PM 0.3 mL 12/23/2018 Intramuscular   Manufacturer: [place]   Lot: [id number]   [place]: [id number]

## 2019-03-14 ENCOUNTER — Ambulatory Visit: Payer: PPO | Attending: Internal Medicine

## 2019-03-14 DIAGNOSIS — Z23 Encounter for immunization: Secondary | ICD-10-CM

## 2019-03-14 NOTE — Progress Notes (Signed)
   Covid-19 Vaccination Clinic  Name:  MAYSON MCNEISH    MRN: [id number] DOB: December 07, 1924  03/14/2019  Mr. Schranz was observed post Covid-19 immunization for 15 minutes without incident. He was provided with Vaccine Information Sheet and instruction to access the V-Safe system.   Mr. Spickler was instructed to call 911 with any severe reactions post vaccine: Marland Kitchen Difficulty breathing  . Swelling of face and throat  . A fast heartbeat  . A bad rash all over body  . Dizziness and weakness   Immunizations Administered    Name Date Dose VIS Date Route   Pfizer COVID-19 Vaccine 03/14/2019  4:16 PM 0.3 mL 12/23/2018 Intramuscular   Manufacturer: [place]   Lot: [id number]   [place]: [id number]

## 2019-03-22 DIAGNOSIS — R7303 Prediabetes: Secondary | ICD-10-CM | POA: Diagnosis not present

## 2019-03-22 DIAGNOSIS — I10 Essential (primary) hypertension: Secondary | ICD-10-CM | POA: Diagnosis not present

## 2019-03-22 DIAGNOSIS — M549 Dorsalgia, unspecified: Secondary | ICD-10-CM | POA: Diagnosis not present

## 2019-03-22 DIAGNOSIS — I129 Hypertensive chronic kidney disease with stage 1 through stage 4 chronic kidney disease, or unspecified chronic kidney disease: Secondary | ICD-10-CM | POA: Diagnosis not present

## 2019-03-22 DIAGNOSIS — E785 Hyperlipidemia, unspecified: Secondary | ICD-10-CM | POA: Diagnosis not present

## 2019-04-06 DIAGNOSIS — R7303 Prediabetes: Secondary | ICD-10-CM | POA: Diagnosis not present

## 2019-04-06 DIAGNOSIS — E785 Hyperlipidemia, unspecified: Secondary | ICD-10-CM | POA: Diagnosis not present

## 2019-04-06 DIAGNOSIS — I10 Essential (primary) hypertension: Secondary | ICD-10-CM | POA: Diagnosis not present

## 2019-04-06 DIAGNOSIS — I129 Hypertensive chronic kidney disease with stage 1 through stage 4 chronic kidney disease, or unspecified chronic kidney disease: Secondary | ICD-10-CM | POA: Diagnosis not present

## 2019-04-12 DIAGNOSIS — N3289 Other specified disorders of bladder: Secondary | ICD-10-CM | POA: Diagnosis not present

## 2019-04-12 DIAGNOSIS — I129 Hypertensive chronic kidney disease with stage 1 through stage 4 chronic kidney disease, or unspecified chronic kidney disease: Secondary | ICD-10-CM | POA: Diagnosis not present

## 2019-04-12 DIAGNOSIS — E875 Hyperkalemia: Secondary | ICD-10-CM | POA: Diagnosis not present

## 2019-04-12 DIAGNOSIS — N184 Chronic kidney disease, stage 4 (severe): Secondary | ICD-10-CM | POA: Diagnosis not present

## 2019-05-08 ENCOUNTER — Ambulatory Visit: Payer: PPO | Admitting: Family Medicine

## 2019-05-08 ENCOUNTER — Encounter: Payer: Self-pay | Admitting: Family Medicine

## 2019-05-08 ENCOUNTER — Other Ambulatory Visit: Payer: Self-pay

## 2019-05-08 ENCOUNTER — Ambulatory Visit (INDEPENDENT_AMBULATORY_CARE_PROVIDER_SITE_OTHER): Payer: PPO

## 2019-05-08 VITALS — BP 132/84 | HR 110 | Ht 66.0 in | Wt 170.0 lb

## 2019-05-08 DIAGNOSIS — M549 Dorsalgia, unspecified: Secondary | ICD-10-CM | POA: Diagnosis not present

## 2019-05-08 DIAGNOSIS — M545 Low back pain, unspecified: Secondary | ICD-10-CM

## 2019-05-08 DIAGNOSIS — M5136 Other intervertebral disc degeneration, lumbar region: Secondary | ICD-10-CM

## 2019-05-08 MED ORDER — GABAPENTIN 100 MG PO CAPS: 200.0000 mg | ORAL_CAPSULE | Freq: Every day | ORAL | 0 refills | Status: DC

## 2019-05-08 MED ORDER — METHYLPREDNISOLONE ACETATE 40 MG/ML IJ SUSP
40.0000 mg | Freq: Once | INTRAMUSCULAR | Status: AC
  Administered 2019-05-08: 11:00:00 40 mg via INTRAMUSCULAR

## 2019-05-08 MED ORDER — KETOROLAC TROMETHAMINE 30 MG/ML IJ SOLN
30.0000 mg | Freq: Once | INTRAMUSCULAR | Status: AC
  Administered 2019-05-08: 11:00:00 30 mg via INTRAMUSCULAR

## 2019-05-08 NOTE — Assessment & Plan Note (Addendum)
Degenerative disc disease.  Will start gabapentin.  Patient given home exercises, concern for mild radicular symptoms that could be contributing.  30 mg of Toradol and 40 mg Depo-Medrol given today.  Patient does not want to take medications regularly and in the long run we will hopefully get him even off of the gabapentin.  Patient wants to try with the home exercises but will consider formal physical therapy.  Follow-up again in 6 to 8 weeks.  Worsening pain or no improvement will consider advanced imaging with patient's past medical history of some cancer.  Addendum to note.  Received a call from front desk patient did have some diarrhea and felt feverish initially when he returned home after the injections and running errands.  Patient has a history of neuritis and took doxycycline and states that he is feeling significantly better.  Patient states that the back is feeling significantly better as well.  Time of call was at 6:40 PM.  Discussed with patient of action plan if any worsening symptoms.  Patient states that he had only one episode of the diarrhea and temperature went up to 100.6 but now is normal again.  No hematuria noted.  No blood in the stool either.  Patient is actually feeling better than he did this morning already at this time.  Discussed with him on taking the doxycycline twice a day for 3 to 7 days, of course avoiding any type of anti-inflammatories.  Patient was given trial of topical anti-inflammatories but would like him to hold that for the next 48 hours as well.  Worsening symptoms patient is to go to the emergency room.  Patient will call in the morning for further discussion.

## 2019-05-08 NOTE — Progress Notes (Signed)
[place] Phone: [phone] Subjective:   Jesus Kim, am serving as a scribe for Dr. Hulan Saas. This visit occurred during the SARS-CoV-2 public health emergency.  Safety protocols were in place, including screening questions prior to the visit, additional usage of staff PPE, and extensive cleaning of exam room while observing appropriate contact time as indicated for disinfecting solutions.    I'm seeing this patient by the request  of:  [name], MD  CC: Low back pain  [id number]   12/20/2018 Anserine bursitis noted.  Discussed home exercises and icing regimen, 5 compression sleeve given, discussed over-the-counter topical anti-inflammatories.  Increase activity slowly.  Follow-up again in 4 to 8 weeks   repeat injection given December 20, 2018 patient has responded very well in the past.  We decided to be a little more aggressive secondary to the coronavirus.  Discussed icing regimen and home exercise, discussed topical anti-inflammatories.  Patient does have L4-L5 arthritic changes of the back that could be contributing to some of his pain as well.  Hopefully though this will be beneficial.  Follow-up again in 4 weeks.  Update  MICAHEL Kim is a 86 y.o. male coming in with complaint of chronic back pain. Patient states that he has a hard time with sit to stand. Pain with walking. Pain is dull. Denies any sharp pain. Denies any radiating symptoms. Uses Extra Strength Tylenol and turmeric. Turmeric causes diarrhea.  Patient states it is more of a dull, throbbing aching pain when it occurs.  States that it seems to be worse with change in position and then certain rotation.  Seems to be more on the right side of the pain of the back.  Patient has a past medical history known for L4-L5 arthritic changes.    Past Medical History:  Diagnosis Date  . Arthritis   . Cancer [facility])    Colon  . Hyperlipidemia    . Hypertension   . Melanoma [facility])    Past Surgical History:  Procedure Laterality Date  . BALLOON DILATION N/A 04/02/2015   Procedure: BALLOON DILATION;  Surgeon: Bjorn Loser, MD;  Location: [facility];  Service: Urology;  Laterality: N/A;  . CATARACT EXTRACTION    . COLON SURGERY     1981 - colon cancer  . CYSTOSCOPY WITH RETROGRADE PYELOGRAM, URETEROSCOPY AND STENT PLACEMENT N/A 04/02/2015   Procedure: CYSTO WITH RETROGRADE ;  Surgeon: Bjorn Loser, MD;  Location: [facility];  Service: Urology;  Laterality: N/A;  . Left thub surgery    . TONSILLECTOMY    . URETHRAL STRICTURE DILATATION     Social History   Socioeconomic History  . Marital status: Married    Spouse name: Not on file  . Number of children: 3  . Years of education: 30  . Highest education level: Not on file  Occupational History  . Not on file  Tobacco Use  . Smoking status: Former Smoker    Quit date: 01/31/1977    Years since quitting: 42.2  . Smokeless tobacco: Never Used  Substance and Sexual Activity  . Alcohol use: Yes    Alcohol/week: 1.0 standard drinks    Types: 1 Glasses of wine per week  . Drug use: Kim  . Sexual activity: Not on file  Other Topics Concern  . Not on file  Social History Narrative  . Not on file   Social Determinants of Health   Financial Resource Strain:   .  Difficulty of Paying Living Expenses:   Food Insecurity:   . Worried About Charity fundraiser in the Last Year:   . Arboriculturist in the Last Year:   Transportation Needs:   . Film/video editor (Medical):   Marland Kitchen Lack of Transportation (Non-Medical):   Physical Activity:   . Days of Exercise per Week:   . Minutes of Exercise per Session:   Stress:   . Feeling of Stress :   Social Connections:   . Frequency of Communication with Friends and Family:   . Frequency of Social Gatherings with Friends and Family:   . Attends Religious Services:   . Active Member of Clubs or Organizations:   . Attends Theatre manager Meetings:   Marland Kitchen Marital Status:    Allergies  Allergen Reactions  . Sulfa Antibiotics Rash   Kim family history on file.   Current Outpatient Medications (Cardiovascular):  .  lisinopril (PRINIVIL,ZESTRIL) 20 MG tablet, Take 20 mg by mouth daily.  .  simvastatin (ZOCOR) 20 MG tablet, Take 20 mg by mouth daily.   Current Outpatient Medications (Analgesics):  .  acetaminophen (TYLENOL) 500 MG tablet, Take 500-1,000 mg by mouth every 6 (six) hours as needed for moderate pain. Marland Kitchen  aspirin 81 MG tablet, Take 81 mg by mouth daily.   Current Outpatient Medications (Other):  .  diphenhydramine-acetaminophen (TYLENOL PM) 25-500 MG TABS tablet, Take 1 tablet by mouth at bedtime. .  finasteride (PROSCAR) 5 MG tablet, Take 5 mg by mouth daily. Evening .  Multiple Vitamins-Minerals (PRESERVISION AREDS 2+MULTI VIT PO), Take 2 tablets by mouth daily. Marland Kitchen  gabapentin (NEURONTIN) 100 MG capsule, Take 1 capsule (100 mg total) by mouth at bedtime. .  gabapentin (NEURONTIN) 100 MG capsule, Take 2 capsules (200 mg total) by mouth at bedtime.   Reviewed prior external information including notes and imaging from  primary care provider As well as notes that were available from care everywhere and other healthcare systems.  Past medical history, social, surgical and family history all reviewed in electronic medical record.  Kim pertanent information unless stated regarding to the chief complaint.   Review of Systems:  Kim headache, visual changes, nausea, vomiting, diarrhea, constipation, dizziness, abdominal pain, skin rash, fevers, chills, night sweats, weight loss, swollen lymph nodes, body aches, joint swelling, chest pain, shortness of breath, mood changes. POSITIVE muscle aches  Objective  Blood pressure 132/84, pulse (!) 110, height 5\' 6"  (1.676 m), weight 170 lb (77.1 kg), SpO2 98 %.   General: Kim apparent distress alert and oriented x3 mood and affect normal, dressed appropriately.    HEENT: Pupils equal, extraocular movements intact  Respiratory: Patient's speak in full sentences and does not appear short of breath  Cardiovascular: Kim lower extremity edema, non tender, Kim erythema  Neuro: Cranial nerves II through XII are intact, neurovascularly intact in all extremities with 2+ DTRs and 2+ pulses.  Gait normal with good balance and coordination.  MSK:   arthritic changes of multiple joints Low back exam does have some loss of lordosis.  Severe tenderness to palpation over the L4-L5 paraspinal musculature.  Kim spinous process tenderness.  Tightness noted with FABER test.  Tightness with straight leg test but Kim true radicular symptoms.  Worsening pain with extension of the back.  Patient does have what appears to be more of a muscle spasm on the right side in the lumbar region.   [id number]; 15 additional minutes spent for Therapeutic exercises as  stated in above notes.  This included exercises focusing on stretching, strengthening, with significant focus on eccentric aspects.   Long term goals include an improvement in range of motion, strength, endurance as well as avoiding reinjury. Patient's frequency would include in 1-2 times a day, 3-5 times a week for a duration of 6-12 weeks. Low back exercises that included:  Pelvic tilt/bracing instruction to focus on control of the pelvic girdle and lower abdominal muscles  Glute strengthening exercises, focusing on proper firing of the glutes without engaging the low back muscles Proper stretching techniques for maximum relief for the hamstrings, hip flexors, low back and some rotation where tolerated    Proper technique shown and discussed handout in great detail with ATC.  All questions were discussed and answered.     Impression and Recommendations:     This case required medical decision making of moderate complexity. The above documentation has been reviewed and is accurate and complete [name], DO       Note: This  dictation was prepared with Dragon dictation along with smaller phrase technology. Any transcriptional errors that result from this process are unintentional.

## 2019-05-08 NOTE — Patient Instructions (Signed)
Gabapentin 200mg  at night Exercise 3 x a day Xray today Pennsaid 2x a day fingertip amount Ice 20 min a day Phone f/u in 3 weeks if not better will consider MRI

## 2019-05-15 ENCOUNTER — Other Ambulatory Visit: Payer: Self-pay

## 2019-05-15 ENCOUNTER — Telehealth: Payer: Self-pay | Admitting: Family Medicine

## 2019-05-15 DIAGNOSIS — M5136 Other intervertebral disc degeneration, lumbar region: Secondary | ICD-10-CM

## 2019-05-15 MED ORDER — TRAMADOL HCL 50 MG PO TABS: 50.0000 mg | ORAL_TABLET | Freq: Three times a day (TID) | ORAL | 0 refills | Status: DC | PRN

## 2019-05-15 NOTE — Telephone Encounter (Signed)
Patient called stating that he was here to see Dr Tamala Julian on 05/08/2019.He had hip injections and was prescribed some medication. He said that he is still in a lot of pain. Dr Tamala Julian mentioned doing an MRI if he was not better. He asked if someone could call him to discuss this.  Please advise.

## 2019-05-15 NOTE — Telephone Encounter (Signed)
Tramadol sent to [place]. Tramadol may cause drowsiness or constipation.  Use with caution.

## 2019-05-15 NOTE — Telephone Encounter (Signed)
Spoke with patient about medication. Was able to schedule MRI tomorrow. Told him Dr. Tamala Julian will get back with him next week regarding results.

## 2019-05-15 NOTE — Telephone Encounter (Signed)
Attempted to call patient. No voicemail.

## 2019-05-16 ENCOUNTER — Telehealth: Payer: Self-pay

## 2019-05-16 ENCOUNTER — Ambulatory Visit
Admission: RE | Admit: 2019-05-16 | Discharge: 2019-05-16 | Disposition: A | Discharge status: Home / Self Care | Payer: PPO | Source: Ambulatory Visit | Attending: Family Medicine | Admitting: Family Medicine

## 2019-05-16 DIAGNOSIS — M48061 Spinal stenosis, lumbar region without neurogenic claudication: Secondary | ICD-10-CM | POA: Diagnosis not present

## 2019-05-16 DIAGNOSIS — M5136 Other intervertebral disc degeneration, lumbar region: Secondary | ICD-10-CM

## 2019-05-16 NOTE — Telephone Encounter (Signed)
[facility] Kim imaging called with a call report for patient MRI of Lumbar spine stating, Incidental finding on lumbar spine Sever bilateral hydronephrosis.

## 2019-05-16 NOTE — Telephone Encounter (Signed)
Spoke with patient who will await PCP call prior to moving ahead with any referrals. Told patient I will call tomorrow with MRI results for his back.

## 2019-05-16 NOTE — Telephone Encounter (Signed)
I called PCP's office to inform PCP.  Left a message with the front desk to inform PCP regarding hydronephrosis seen on MRI.  They are recommending urology referral.  I am happy to place a referral but want to make sure PCP is aware.  Provided my cell phone number with instructions to text before call back.  Happy to have a conversation if needed.  Please inform patient regarding hydronephrosis seen on MRI.  Advised that PCP office may be calling him or urologist office may be calling him.

## 2019-05-16 NOTE — Progress Notes (Signed)
I am Dr. Thompson Caul partner and I am covering for him while he is away.  He will also comment on this MRI either while he is away or when he gets back in. MRI does show arthritis in the low back as well as a bulging disc that could be pressing on the right L4 nerve root causing pain at the right knee. It also shows some potentially compressed or irritated nerve root at the left L5 and S1 causing pain in the side of the calf or down the back of the left leg.   Additionally MRI showed severe hydronephrosis.  This is where the urine can get into the kidneys well enough of accepting into the kidneys.  This probably needs to be dealt with by urology.  We will contact you about the hydronephrosis and I have already sent a message to your primary care provider.  This should results in a urology referral.  I can do that myself or can come from your primary care provider.

## 2019-05-18 ENCOUNTER — Other Ambulatory Visit: Payer: Self-pay

## 2019-05-18 DIAGNOSIS — M5136 Other intervertebral disc degeneration, lumbar region: Secondary | ICD-10-CM

## 2019-05-18 DIAGNOSIS — N1330 Unspecified hydronephrosis: Secondary | ICD-10-CM

## 2019-05-18 NOTE — Progress Notes (Signed)
Spoke with patient. Urology referral made. Epidural order placed.

## 2019-05-18 NOTE — Telephone Encounter (Signed)
Patient was referred to urology.  Have not heard back from PCP yet

## 2019-05-19 ENCOUNTER — Encounter: Payer: Self-pay | Admitting: Family Medicine

## 2019-05-25 ENCOUNTER — Ambulatory Visit
Admission: RE | Admit: 2019-05-25 | Discharge: 2019-05-25 | Disposition: A | Discharge status: Home / Self Care | Payer: PPO | Source: Ambulatory Visit | Attending: Family Medicine | Admitting: Family Medicine

## 2019-05-25 DIAGNOSIS — M48061 Spinal stenosis, lumbar region without neurogenic claudication: Secondary | ICD-10-CM | POA: Diagnosis not present

## 2019-05-25 DIAGNOSIS — M5136 Other intervertebral disc degeneration, lumbar region: Secondary | ICD-10-CM

## 2019-05-25 MED ORDER — IOPAMIDOL (ISOVUE-M 200) INJECTION 41%
1.0000 mL | Freq: Once | INTRAMUSCULAR | Status: AC
  Administered 2019-05-25: 1 mL via EPIDURAL

## 2019-05-25 MED ORDER — METHYLPREDNISOLONE ACETATE 40 MG/ML INJ SUSP (RADIOLOG
120.0000 mg | Freq: Once | INTRAMUSCULAR | Status: AC
  Administered 2019-05-25: 120 mg via EPIDURAL

## 2019-05-25 NOTE — Discharge Instructions (Signed)

## 2019-05-29 ENCOUNTER — Ambulatory Visit: Payer: PPO | Admitting: Family Medicine

## 2019-06-26 ENCOUNTER — Ambulatory Visit: Payer: PPO | Admitting: Family Medicine

## 2019-06-26 ENCOUNTER — Other Ambulatory Visit: Payer: Self-pay

## 2019-06-26 DIAGNOSIS — M5136 Other intervertebral disc degeneration, lumbar region: Secondary | ICD-10-CM

## 2019-06-26 NOTE — Progress Notes (Signed)
[name] Phone: [phone] Subjective:   [name], am serving as a scribe for Dr. Hulan Saas. This visit occurred during the SARS-CoV-2 public health emergency.  Safety protocols were in place, including screening questions prior to the visit, additional usage of staff PPE, and extensive cleaning of exam room while observing appropriate contact time as indicated for disinfecting solutions.  I'm seeing this patient by the request  of:  Jesus Pepper, MD  CC: Low back pain follow-up  [id number]  Jesus Kim is a 86 y.o. male coming in with complaint of low back pain. States that the epidural helped his disc pain but not his arthritis pain.  Discontinued gabapentin as this was not helping him. Pain is worse with movement across the lower back. Patient notices that he does not have to rest as much at the store.       Past Medical History:  Diagnosis Date  . Arthritis   . Cancer [facility])    Colon  . Hyperlipidemia   . Hypertension   . Melanoma [facility])    Past Surgical History:  Procedure Laterality Date  . BALLOON DILATION N/A 04/02/2015   Procedure: BALLOON DILATION;  Surgeon: Bjorn Loser, MD;  [facility];  Service: Urology;  Laterality: N/A;  . CATARACT EXTRACTION    . COLON SURGERY     1981 - colon cancer  . CYSTOSCOPY WITH RETROGRADE PYELOGRAM, URETEROSCOPY AND STENT PLACEMENT N/A 04/02/2015   Procedure: CYSTO WITH RETROGRADE ;  Surgeon: Bjorn Loser, MD;  Location[facility];  Service: Urology;  Laterality: N/A;  . Left thub surgery    . TONSILLECTOMY    . URETHRAL STRICTURE DILATATION     Social History   Socioeconomic History  . Marital status: Married    Spouse name: Not on file  . Number of children: 3  . Years of education: 48  . Highest education level: Not on file  Occupational History  . Not on file  Tobacco Use  . Smoking status: Former Smoker    Quit date:  01/31/1977    Years since quitting: 42.4  . Smokeless tobacco: Never Used  Vaping Use  . Vaping Use: Never used  Substance and Sexual Activity  . Alcohol use: Yes    Alcohol/week: 1.0 standard drink    Types: 1 Glasses of wine per week  . Drug use: No  . Sexual activity: Not on file  Other Topics Concern  . Not on file  Social History Narrative  . Not on file   Social Determinants of Health   Financial Resource Strain:   . Difficulty of Paying Living Expenses:   Food Insecurity:   . Worried About Charity fundraiser in the Last Year:   . Arboriculturist in the Last Year:   Transportation Needs:   . Film/video editor (Medical):   Marland Kitchen Lack of Transportation (Non-Medical):   Physical Activity:   . Days of Exercise per Week:   . Minutes of Exercise per Session:   Stress:   . Feeling of Stress :   Social Connections:   . Frequency of Communication with Friends and Family:   . Frequency of Social Gatherings with Friends and Family:   . Attends Religious Services:   . Active Member of Clubs or Organizations:   . Attends Archivist Meetings:   Marland Kitchen Marital Status:    Allergies  Allergen Reactions  .  Sulfa Antibiotics Rash   No family history on file.   Current Outpatient Medications (Cardiovascular):  .  lisinopril (PRINIVIL,ZESTRIL) 20 MG tablet, Take 20 mg by mouth daily.  .  simvastatin (ZOCOR) 20 MG tablet, Take 20 mg by mouth daily.   Current Outpatient Medications (Analgesics):  .  acetaminophen (TYLENOL) 500 MG tablet, Take 500-1,000 mg by mouth every 6 (six) hours as needed for moderate pain. .  traMADol (ULTRAM) 50 MG tablet, Take 1 tablet (50 mg total) by mouth every 8 (eight) hours as needed for severe pain. Marland Kitchen  aspirin 81 MG tablet, Take 81 mg by mouth daily.   Current Outpatient Medications (Other):  .  diphenhydramine-acetaminophen (TYLENOL PM) 25-500 MG TABS tablet, Take 1 tablet by mouth at bedtime. .  finasteride (PROSCAR) 5 MG tablet, Take  5 mg by mouth daily. Evening .  gabapentin (NEURONTIN) 100 MG capsule, Take 2 capsules (200 mg total) by mouth at bedtime. .  Multiple Vitamins-Minerals (PRESERVISION AREDS 2+MULTI VIT PO), Take 2 tablets by mouth daily.   Reviewed prior external information including notes and imaging from  primary care provider As well as notes that were available from care everywhere and other healthcare systems.  Past medical history, social, surgical and family history all reviewed in electronic medical record.  No pertanent information unless stated regarding to the chief complaint.   Review of Systems:  No headache, visual changes, nausea, vomiting, diarrhea, constipation, dizziness, abdominal pain, skin rash, fevers, chills, night sweats, weight loss, swollen lymph nodes, , joint swelling, chest pain, shortness of breath, mood changes. POSITIVE muscle aches, body aches  Objective  Blood pressure 140/88, pulse (!) 108, height 5\' 6"  (1.676 m), weight 169 lb (76.7 kg), SpO2 98 %.   General: No apparent distress alert and oriented x3 mood and affect normal, dressed appropriately.  HEENT: Pupils equal, extraocular movements intact  Respiratory: Patient's speak in full sentences and does not appear short of breath  Cardiovascular: No lower extremity edema, non tender, no erythema  Neuro: Cranial nerves II through XII are intact, neurovascularly intact in all extremities with 2+ DTRs and 2+ pulses.  Gait normal with good balance and coordination.  MSK: Low back exam shows some loss of lordosis.  Arthritic changes that are consistent patient has still tightness noted but no radicular symptoms with straight leg test.  Patient does have tightness with FABER test as well.  Mild pain in the paraspinal musculature of the lumbar spine   Impression and Recommendations:     The above documentation has been reviewed and is accurate and complete [name], DO       Note: This dictation was prepared with  Dragon dictation along with smaller phrase technology. Any transcriptional errors that result from this process are unintentional.

## 2019-06-26 NOTE — Patient Instructions (Signed)
Gluathione 1500mg  daily  Try to watch weight Add 2 cups of water in morning Check in 2 months

## 2019-06-27 ENCOUNTER — Encounter: Payer: Self-pay | Admitting: Family Medicine

## 2019-06-27 NOTE — Assessment & Plan Note (Signed)
Known severe arthritic changes of the back.  Patient's epidural did help with a lot of the radicular pain in the severe pain.  Still but unfortunately has the dull aching pain fairly regularly.  We discussed with patient about icing regimen, home exercise, medications including the gabapentin and why to continue it.  We discussed the potential for repeating the epidurals.  Patient will continue with the home exercises but would like to hold on formal physical therapy at this time.  Patient will follow up with me again in 4 to 6 weeks otherwise.

## 2019-06-28 ENCOUNTER — Encounter: Payer: Self-pay | Admitting: Family Medicine

## 2019-07-07 DIAGNOSIS — R3914 Feeling of incomplete bladder emptying: Secondary | ICD-10-CM | POA: Diagnosis not present

## 2019-07-07 DIAGNOSIS — N453 Epididymo-orchitis: Secondary | ICD-10-CM | POA: Diagnosis not present

## 2019-07-14 ENCOUNTER — Other Ambulatory Visit: Payer: Self-pay

## 2019-07-14 ENCOUNTER — Ambulatory Visit (INDEPENDENT_AMBULATORY_CARE_PROVIDER_SITE_OTHER): Payer: PPO | Admitting: Otolaryngology

## 2019-07-14 ENCOUNTER — Encounter (INDEPENDENT_AMBULATORY_CARE_PROVIDER_SITE_OTHER): Payer: Self-pay | Admitting: Otolaryngology

## 2019-07-14 VITALS — Temp 97.7°F

## 2019-07-14 DIAGNOSIS — H6123 Impacted cerumen, bilateral: Secondary | ICD-10-CM | POA: Diagnosis not present

## 2019-07-14 NOTE — Progress Notes (Signed)
HPI: Jesus Kim is a 86 y.o. male who presents for evaluation of wax buildup in his ears.  He has small ear canals bilaterally.  He last had them cleaned a little over 2 years ago.  He is experiencing decreased hearing.  But no drainage or pain..  Past Medical History:  Diagnosis Date  . Arthritis   . Cancer [facility])    Colon  . Hyperlipidemia   . Hypertension   . Melanoma [facility])    Past Surgical History:  Procedure Laterality Date  . BALLOON DILATION N/A 04/02/2015   Procedure: BALLOON DILATION;  Surgeon: Bjorn Loser, MD;  [facility];  Service: Urology;  Laterality: N/A;  . CATARACT EXTRACTION    . COLON SURGERY     1981 - colon cancer  . CYSTOSCOPY WITH RETROGRADE PYELOGRAM, URETEROSCOPY AND STENT PLACEMENT N/A 04/02/2015   Procedure: CYSTO WITH RETROGRADE ;  Surgeon: Bjorn Loser, MD;  Location[facility];  Service: Urology;  Laterality: N/A;  . Left thub surgery    . TONSILLECTOMY    . URETHRAL STRICTURE DILATATION     Social History   Socioeconomic History  . Marital status: Married    Spouse name: Not on file  . Number of children: 3  . Years of education: 68  . Highest education level: Not on file  Occupational History  . Not on file  Tobacco Use  . Smoking status: Former Smoker    Quit date: 01/31/1977    Years since quitting: 42.4  . Smokeless tobacco: Never Used  Vaping Use  . Vaping Use: Never used  Substance and Sexual Activity  . Alcohol use: Yes    Alcohol/week: 1.0 standard drink    Types: 1 Glasses of wine per week  . Drug use: No  . Sexual activity: Not on file  Other Topics Concern  . Not on file  Social History Narrative  . Not on file   Social Determinants of Health   Financial Resource Strain:   . Difficulty of Paying Living Expenses:   Food Insecurity:   . Worried About Charity fundraiser in the Last Year:   . Arboriculturist in the Last Year:   Transportation Needs:   . Film/video editor (Medical):   Marland Kitchen Lack of  Transportation (Non-Medical):   Physical Activity:   . Days of Exercise per Week:   . Minutes of Exercise per Session:   Stress:   . Feeling of Stress :   Social Connections:   . Frequency of Communication with Friends and Family:   . Frequency of Social Gatherings with Friends and Family:   . Attends Religious Services:   . Active Member of Clubs or Organizations:   . Attends Archivist Meetings:   Marland Kitchen Marital Status:    No family history on file. Allergies  Allergen Reactions  . Sulfa Antibiotics Rash   Prior to Admission medications   Medication Sig Start Date End Date Taking? Authorizing Provider  acetaminophen (TYLENOL) 500 MG tablet Take 500-1,000 mg by mouth every 6 (six) hours as needed for moderate pain.   Yes [provider]  diphenhydramine-acetaminophen (TYLENOL PM) 25-500 MG TABS tablet Take 1 tablet by mouth at bedtime.   Yes [provider]  finasteride (PROSCAR) 5 MG tablet Take 5 mg by mouth daily. Evening   Yes [provider]  gabapentin (NEURONTIN) 100 MG capsule Take 2 capsules (200 mg total) by mouth at bedtime. 05/08/19  Yes Tamala Julian,  Olevia Bowens, DO  lisinopril (PRINIVIL,ZESTRIL) 20 MG tablet Take 20 mg by mouth daily.    Yes [provider]  Multiple Vitamins-Minerals (PRESERVISION AREDS 2+MULTI VIT PO) Take 2 tablets by mouth daily.   Yes [provider]  simvastatin (ZOCOR) 20 MG tablet Take 20 mg by mouth daily.   Yes [provider]  traMADol (ULTRAM) 50 MG tablet Take 1 tablet (50 mg total) by mouth every 8 (eight) hours as needed for severe pain. 05/15/19  Yes Gregor Hams, MD  aspirin 81 MG tablet Take 81 mg by mouth daily.    [provider]     Positive ROS: Otherwise negative  All other systems have been reviewed and were otherwise negative with the exception of those mentioned in the HPI and as above.  Physical Exam: Constitutional: Alert, well-appearing, no acute distress Ears:  External ears without lesions or tenderness. Ear canals are small bilaterally.  Ear canals were cleaned with suction and hydrogen peroxide.  TMs were clear bilaterally.. Nasal: External nose without lesions. Clear nasal passages Oral: Oropharynx clear. Neck: No palpable adenopathy or masses Respiratory: Breathing comfortably  Skin: No facial/neck lesions or rash noted.  Cerumen impaction removal  Date/Time: 07/14/2019 4:42 PM Performed by: Rozetta Nunnery, MD Authorized by: Rozetta Nunnery, MD   Consent:    Consent obtained:  Verbal   Consent given by:  Patient   Risks discussed:  Pain and bleeding Procedure details:    Location:  L ear and R ear   Procedure type: curette and suction   Post-procedure details:    Inspection:  TM intact and canal normal   Hearing quality:  Improved   Patient tolerance of procedure:  Tolerated well, no immediate complications Comments:     TMs are clear bilaterally.    Assessment: Patient with small ear canals and large amount of wax in both ear canals.  Plan: This was cleaned in the office with suction.  Hearing was improved after cleaning the ear canals.  He will follow-up as needed.  Radene Journey, MD

## 2019-07-25 ENCOUNTER — Other Ambulatory Visit: Payer: Self-pay

## 2019-07-25 ENCOUNTER — Encounter: Payer: Self-pay | Admitting: Family Medicine

## 2019-07-25 ENCOUNTER — Ambulatory Visit: Payer: PPO | Admitting: Family Medicine

## 2019-07-25 ENCOUNTER — Ambulatory Visit: Payer: Self-pay

## 2019-07-25 VITALS — BP 150/82 | HR 93 | Ht 66.0 in | Wt 169.0 lb

## 2019-07-25 DIAGNOSIS — M47818 Spondylosis without myelopathy or radiculopathy, sacral and sacrococcygeal region: Secondary | ICD-10-CM | POA: Diagnosis not present

## 2019-07-25 DIAGNOSIS — M25552 Pain in left hip: Secondary | ICD-10-CM | POA: Diagnosis not present

## 2019-07-25 DIAGNOSIS — M25551 Pain in right hip: Secondary | ICD-10-CM

## 2019-07-25 DIAGNOSIS — M51369 Other intervertebral disc degeneration, lumbar region without mention of lumbar back pain or lower extremity pain: Secondary | ICD-10-CM

## 2019-07-25 DIAGNOSIS — M5136 Other intervertebral disc degeneration, lumbar region: Secondary | ICD-10-CM | POA: Diagnosis not present

## 2019-07-25 DIAGNOSIS — M461 Sacroiliitis, not elsewhere classified: Secondary | ICD-10-CM

## 2019-07-25 NOTE — Patient Instructions (Addendum)
Good to see you Lets hope the injections work Can consider epidural See me again as scheduled

## 2019-07-25 NOTE — Assessment & Plan Note (Signed)
Injections bilaterally.  The seem to be localized more to the sacroiliac joint.  Known degenerative disc disease of the lumbar spine with nerve impingement and may need to consider the epidural.  Discussed home exercise, icing regimen, which activities to doing which wants to avoid.  Increase activity slowly.  Encourage patient to consider the possibility of the epidural.  Patient does not feel the gabapentin is helping and we discussed decreasing to 0 pills at night for 1 week and see if any changes.  Follow-up again in 3 to 4 weeks

## 2019-07-25 NOTE — Progress Notes (Signed)
[facility] [address] [place] Phone: [phone] Subjective:   I Jesus Kim am serving as a Education administrator for Dr. Hulan Saas.  This visit occurred during the SARS-CoV-2 public health emergency.  Safety protocols were in place, including screening questions prior to the visit, additional usage of staff PPE, and extensive cleaning of exam room while observing appropriate contact time as indicated for disinfecting solutions.   I'm seeing this patient by the request  of:  [name], MD  CC: Low back pain follow-up  [id number]   06/26/2019 Known severe arthritic changes of the back.  Patient's epidural did help with a lot of the radicular pain in the severe pain.  Still but unfortunately has the dull aching pain fairly regularly.  We discussed with patient about icing regimen, home exercise, medications including the gabapentin and why to continue it.  We discussed the potential for repeating the epidurals.  Patient will continue with the home exercises but would like to hold on formal physical therapy at this time.  Patient will follow up with me again in 4 to 6 weeks otherwise.  Update 07/25/2019 Jesus Kim is a 86 y.o. male coming in with complaint of back and hip pain. Patient states he would like an injection. Left hip is painful as well but not as bad.  Patient states that it seems to be more associated just above the buttocks region.  Seems to be bilateral. No significant radicular symptoms.  Starting to affect her daily activities again and waking him up at night.  Has been taking gabapentin but does not want to increase the dose because he does not feel like it is helping overall.   Past Medical History:  Diagnosis Date  . Arthritis   . Cancer [facility])    Colon  . Hyperlipidemia   . Hypertension   . Melanoma [facility])    Past Surgical History:  Procedure Laterality Date  . BALLOON DILATION N/A 04/02/2015   Procedure: BALLOON  DILATION;  Surgeon: Bjorn Loser, MD;  Location: [facility];  Service: Urology;  Laterality: N/A;  . CATARACT EXTRACTION    . COLON SURGERY     1981 - colon cancer  . CYSTOSCOPY WITH RETROGRADE PYELOGRAM, URETEROSCOPY AND STENT PLACEMENT N/A 04/02/2015   Procedure: CYSTO WITH RETROGRADE ;  Surgeon: Bjorn Loser, MD;  Location: [facility];  Service: Urology;  Laterality: N/A;  . Left thub surgery    . TONSILLECTOMY    . URETHRAL STRICTURE DILATATION     Social History   Socioeconomic History  . Marital status: Married    Spouse name: Not on file  . Number of children: 3  . Years of education: 45  . Highest education level: Not on file  Occupational History  . Not on file  Tobacco Use  . Smoking status: Former Smoker    Quit date: 01/31/1977    Years since quitting: 42.5  . Smokeless tobacco: Never Used  Vaping Use  . Vaping Use: Never used  Substance and Sexual Activity  . Alcohol use: Yes    Alcohol/week: 1.0 standard drink    Types: 1 Glasses of wine per week  . Drug use: No  . Sexual activity: Not on file  Other Topics Concern  . Not on file  Social History Narrative  . Not on file   Social Determinants of Health   Financial Resource Strain:   . Difficulty of Paying Living Expenses:   Food Insecurity:   .  Worried About Charity fundraiser in the Last Year:   . Arboriculturist in the Last Year:   Transportation Needs:   . Film/video editor (Medical):   Marland Kitchen Lack of Transportation (Non-Medical):   Physical Activity:   . Days of Exercise per Week:   . Minutes of Exercise per Session:   Stress:   . Feeling of Stress :   Social Connections:   . Frequency of Communication with Friends and Family:   . Frequency of Social Gatherings with Friends and Family:   . Attends Religious Services:   . Active Member of Clubs or Organizations:   . Attends Archivist Meetings:   Marland Kitchen Marital Status:    Allergies  Allergen Reactions  . Sulfa Antibiotics Rash   No  family history on file.   Current Outpatient Medications (Cardiovascular):  .  lisinopril (PRINIVIL,ZESTRIL) 20 MG tablet, Take 20 mg by mouth daily.  .  simvastatin (ZOCOR) 20 MG tablet, Take 20 mg by mouth daily.   Current Outpatient Medications (Analgesics):  .  acetaminophen (TYLENOL) 500 MG tablet, Take 500-1,000 mg by mouth every 6 (six) hours as needed for moderate pain. .  traMADol (ULTRAM) 50 MG tablet, Take 1 tablet (50 mg total) by mouth every 8 (eight) hours as needed for severe pain.   Current Outpatient Medications (Other):  .  diphenhydramine-acetaminophen (TYLENOL PM) 25-500 MG TABS tablet, Take 1 tablet by mouth at bedtime. .  finasteride (PROSCAR) 5 MG tablet, Take 5 mg by mouth daily. Evening .  gabapentin (NEURONTIN) 100 MG capsule, Take 2 capsules (200 mg total) by mouth at bedtime. .  Multiple Vitamins-Minerals (PRESERVISION AREDS 2+MULTI VIT PO), Take 2 tablets by mouth daily.   Reviewed prior external information including notes and imaging from  primary care provider As well as notes that were available from care everywhere and other healthcare systems.  Past medical history, social, surgical and family history all reviewed in electronic medical record.  No pertanent information unless stated regarding to the chief complaint.   Review of Systems:  No headache, visual changes, nausea, vomiting, diarrhea, constipation, dizziness, abdominal pain, skin rash, fevers, chills, night sweats, weight loss, swollen lymph nodes, body aches, joint swelling, chest pain, shortness of breath, mood changes. POSITIVE muscle aches  Objective  Blood pressure (!) 150/82, pulse 93, height 5\' 6"  (1.676 m), weight 169 lb (76.7 kg), SpO2 94 %.   General: No apparent distress alert and oriented x3 mood and affect normal, dressed appropriately.  HEENT: Pupils equal, extraocular movements intact  Respiratory: Patient's speak in full sentences and does not appear short of breath    Cardiovascular: No lower extremity edema, non tender, no erythema  Neuro: Cranial nerves II through XII are intact, neurovascularly intact in all extremities with 2+ DTRs and 2+ pulses.  Gait mild antalgic MSK: Severe arthritic changes of multiple joints. Back exam shows loss of lordosis.  Tightness with straight leg test.  Severe tenderness over the sacroiliac joints bilaterally.  Patient has some mild loss of range of motion in the back in all planes.  Procedure: Real-time Ultrasound Guided Injection of right sacroiliac joint Device: GE Logiq Q7 Ultrasound guided injection is preferred based studies that show increased duration, increased effect, greater accuracy, decreased procedural pain, increased response rate, and decreased cost with ultrasound guided versus blind injection.  Verbal informed consent obtained.  Time-out conducted.  Noted no overlying erythema, induration, or other signs of local infection.  Skin prepped in  a sterile fashion.  Local anesthesia: Topical Ethyl chloride.  With sterile technique and under real time ultrasound guidance: With a 21-gauge 2 inch needle injected into the right sacroiliac joint with a total of 0.5 cc of 0.5% Marcaine and 1 cc of Kenalog 40 mg/mL Completed without difficulty  Pain immediately resolved suggesting accurate placement of the medication.  Advised to call if fevers/chills, erythema, induration, drainage, or persistent bleeding.  Images permanently stored and available for review in the ultrasound unit.  Impression: Technically successful ultrasound guided injection.  Procedure: Real-time Ultrasound Guided Injection of left sacroiliac joint Device: GE Logiq Q7 Ultrasound guided injection is preferred based studies that show increased duration, increased effect, greater accuracy, decreased procedural pain, increased response rate, and decreased cost with ultrasound guided versus blind injection.  Verbal informed consent obtained.   Time-out conducted.  Noted no overlying erythema, induration, or other signs of local infection.  Skin prepped in a sterile fashion.  Local anesthesia: Topical Ethyl chloride.  With sterile technique and under real time ultrasound guidance: With a 21-gauge 2 inch needle injected into the left sacroiliac joint with a total of 0.5 cc of 0.5% Marcaine and 0.5 cc of Kenalog 40 mg/mL Completed without difficulty  Pain immediately resolved suggesting accurate placement of the medication.  Advised to call if fevers/chills, erythema, induration, drainage, or persistent bleeding.  Images permanently stored and available for review in the ultrasound unit.  Impression: Technically successful ultrasound guided injection.    Impression and Recommendations:     The above documentation has been reviewed and is accurate and complete [name], DO       Note: This dictation was prepared with Dragon dictation along with smaller phrase technology. Any transcriptional errors that result from this process are unintentional.

## 2019-07-31 DIAGNOSIS — M549 Dorsalgia, unspecified: Secondary | ICD-10-CM | POA: Diagnosis not present

## 2019-07-31 DIAGNOSIS — I10 Essential (primary) hypertension: Secondary | ICD-10-CM | POA: Diagnosis not present

## 2019-07-31 DIAGNOSIS — N183 Chronic kidney disease, stage 3 unspecified: Secondary | ICD-10-CM | POA: Diagnosis not present

## 2019-08-29 ENCOUNTER — Ambulatory Visit: Payer: PPO | Admitting: Family Medicine

## 2019-09-26 DIAGNOSIS — E785 Hyperlipidemia, unspecified: Secondary | ICD-10-CM | POA: Diagnosis not present

## 2019-09-26 DIAGNOSIS — I129 Hypertensive chronic kidney disease with stage 1 through stage 4 chronic kidney disease, or unspecified chronic kidney disease: Secondary | ICD-10-CM | POA: Diagnosis not present

## 2019-09-26 DIAGNOSIS — Z0000 Encounter for general adult medical examination without abnormal findings: Secondary | ICD-10-CM | POA: Diagnosis not present

## 2019-09-26 DIAGNOSIS — R7303 Prediabetes: Secondary | ICD-10-CM | POA: Diagnosis not present

## 2019-09-29 DIAGNOSIS — R7303 Prediabetes: Secondary | ICD-10-CM | POA: Diagnosis not present

## 2019-09-29 DIAGNOSIS — I10 Essential (primary) hypertension: Secondary | ICD-10-CM | POA: Diagnosis not present

## 2019-09-29 DIAGNOSIS — I129 Hypertensive chronic kidney disease with stage 1 through stage 4 chronic kidney disease, or unspecified chronic kidney disease: Secondary | ICD-10-CM | POA: Diagnosis not present

## 2019-09-29 DIAGNOSIS — Z23 Encounter for immunization: Secondary | ICD-10-CM | POA: Diagnosis not present

## 2019-09-29 DIAGNOSIS — Z0000 Encounter for general adult medical examination without abnormal findings: Secondary | ICD-10-CM | POA: Diagnosis not present

## 2019-09-29 DIAGNOSIS — N189 Chronic kidney disease, unspecified: Secondary | ICD-10-CM | POA: Diagnosis not present

## 2019-09-29 DIAGNOSIS — E785 Hyperlipidemia, unspecified: Secondary | ICD-10-CM | POA: Diagnosis not present

## 2019-10-12 ENCOUNTER — Encounter: Payer: Self-pay | Admitting: Family Medicine

## 2019-10-12 ENCOUNTER — Ambulatory Visit: Payer: Self-pay

## 2019-10-12 ENCOUNTER — Other Ambulatory Visit: Payer: Self-pay

## 2019-10-12 ENCOUNTER — Ambulatory Visit (INDEPENDENT_AMBULATORY_CARE_PROVIDER_SITE_OTHER): Payer: PPO | Admitting: Family Medicine

## 2019-10-12 VITALS — BP 140/80 | HR 101 | Ht 66.0 in | Wt 171.0 lb

## 2019-10-12 DIAGNOSIS — M47818 Spondylosis without myelopathy or radiculopathy, sacral and sacrococcygeal region: Secondary | ICD-10-CM

## 2019-10-12 DIAGNOSIS — M7601 Gluteal tendinitis, right hip: Secondary | ICD-10-CM

## 2019-10-12 NOTE — Patient Instructions (Addendum)
Thank you for coming in today.  I've referred you to Physical Therapy.  Let us know if you don't hear from them in one week.  Call or go to the ER if you develop a large red swollen joint with extreme pain or oozing puss.   Keep me updated.   Recheck with myself or Dr Tamala Julian.

## 2019-10-12 NOTE — Progress Notes (Signed)
I, Jesus Kim, LAT, ATC, am serving as scribe for Dr. Lynne Leader.  Jesus Kim is a 86 y.o. male who presents to [place] [facility] today for f/u of B hip pain and low back pain.  He was last seen by Dr. Tamala Julian on 07/25/19 and had B SIJ injections.  He had a prior L L4-5 epidural on 05/25/19.  Since his last visit, pt reports that his R SIJ is bothering him today.  He has some pain in the lateral hip as well.  This was diagnosed as gluteus medius tendinitis.  He was given home exercise program which makes his symptoms worse.  He has not had formal physical therapy for this issue yet.  He has been using ice which helps some.  Diagnostic imaging: L-spine MRI- 05/16/19; L-spine XR- 05/08/19  Pertinent review of systems: No fevers or chills  Relevant historical information: Hypertension   Exam:  BP 140/80 (BP Location: Left Arm, Patient Position: Sitting, Cuff Size: Normal)   Pulse (!) 101   Ht 5\' 6"  (1.676 m)   Wt 171 lb (77.6 kg)   SpO2 96%   BMI 27.60 kg/m  General: Well Developed, well nourished, and in no acute distress.   MSK: Right hip normal-appearing tender palpation SI joint nontender greater trochanter. Hip abduction strength diminished 4/5 with pain. L-spine normal-appearing nontender midline.  Tender palpation right SI joint.  Decreased lumbar motion.    Lab and Radiology Results  Procedure: Real-time Ultrasound Guided Injection of right SI joint Device: Philips Affiniti 50G Images permanently stored and available for review in PACS Verbal informed consent obtained.  Discussed risks and benefits of procedure. Warned about infection bleeding damage to structures skin hypopigmentation and fat atrophy among others. Patient expresses understanding and agreement Time-out conducted.   Noted no overlying erythema, induration, or other signs of local infection.   Skin prepped in a sterile fashion.   Local anesthesia: Topical Ethyl chloride.   With  sterile technique and under real time ultrasound guidance:  40 mg of Kenalog and 2 L of Marcaine injected into joint space. Fluid seen entering the joint space.   Completed without difficulty   Pain partially resolved suggesting accurate placement of the medication.   Advised to call if fevers/chills, erythema, induration, drainage, or persistent bleeding.   Images permanently stored and available for review in the ultrasound unit.  Impression: Technically successful ultrasound guided injection.     Assessment and Plan: 86 y.o. male with right posterior hip pain thought to be due to SI joint dysfunction.  Patient had pretty good results to the previous SI joint injection about 2-1/2 months ago.  We will repeat injection today.  Additionally he has some lateral hip pain thought to be gluteus medius.  Plan to refer to physical therapy for this in the SI joint pain as he is not improved on its own with home exercise program.  Recheck with myself or Dr. Tamala Julian in the future if needed.   PDMP not reviewed this encounter. Orders Placed This Encounter  Procedures  . [place] LIMITED JOINT SPACE STRUCTURES LOW RIGHT(NO LINKED CHARGES)    Order Specific Question:   Reason for Exam (SYMPTOM  OR DIAGNOSIS REQUIRED)    Answer:   R SIJ pain    Order Specific Question:   Preferred imaging location?    Answer:   [place]  . Ambulatory referral to Physical Therapy    Referral Priority:   Routine  Referral Type:   Physical Medicine    Referral Reason:   Specialty Services Required    Requested Specialty:   Physical Therapy   No orders of the defined types were placed in this encounter.    Discussed warning signs or symptoms. Please see discharge instructions. Patient expresses understanding.   The above documentation has been reviewed and is accurate and complete Lynne Leader, M.D.

## 2019-10-17 ENCOUNTER — Other Ambulatory Visit: Payer: Self-pay | Admitting: Family Medicine

## 2019-10-25 DIAGNOSIS — E875 Hyperkalemia: Secondary | ICD-10-CM | POA: Diagnosis not present

## 2019-10-25 DIAGNOSIS — N184 Chronic kidney disease, stage 4 (severe): Secondary | ICD-10-CM | POA: Diagnosis not present

## 2019-10-25 DIAGNOSIS — N3289 Other specified disorders of bladder: Secondary | ICD-10-CM | POA: Diagnosis not present

## 2019-10-25 DIAGNOSIS — N35919 Unspecified urethral stricture, male, unspecified site: Secondary | ICD-10-CM | POA: Diagnosis not present

## 2019-10-25 DIAGNOSIS — I129 Hypertensive chronic kidney disease with stage 1 through stage 4 chronic kidney disease, or unspecified chronic kidney disease: Secondary | ICD-10-CM | POA: Diagnosis not present

## 2019-10-25 DIAGNOSIS — N342 Other urethritis: Secondary | ICD-10-CM | POA: Diagnosis not present

## 2019-11-09 ENCOUNTER — Ambulatory Visit: Payer: PPO | Attending: Family Medicine | Admitting: Physical Therapy

## 2019-11-09 ENCOUNTER — Encounter: Payer: Self-pay | Admitting: Physical Therapy

## 2019-11-09 ENCOUNTER — Other Ambulatory Visit: Payer: Self-pay

## 2019-11-09 DIAGNOSIS — G8929 Other chronic pain: Secondary | ICD-10-CM | POA: Insufficient documentation

## 2019-11-09 DIAGNOSIS — M6281 Muscle weakness (generalized): Secondary | ICD-10-CM | POA: Diagnosis not present

## 2019-11-09 DIAGNOSIS — R262 Difficulty in walking, not elsewhere classified: Secondary | ICD-10-CM

## 2019-11-09 DIAGNOSIS — M545 Low back pain, unspecified: Secondary | ICD-10-CM | POA: Insufficient documentation

## 2019-11-09 DIAGNOSIS — R293 Abnormal posture: Secondary | ICD-10-CM | POA: Diagnosis not present

## 2019-11-09 NOTE — Patient Instructions (Signed)
Supine knees crossed lower trunk rotation x 5-10 each side, hold 10 sec   Quadruped rocking x 10   Single knee to chest x 3, 30 sec   Do 1-2 times per day Soreness is OK, pain is NOT.

## 2019-11-09 NOTE — Therapy (Signed)
[place], [place], [address] Phone: [phone]   Fax:  [phone]  Physical Therapy Evaluation  Patient Details  Name: Jesus Kim MRN: [id number] Date of Birth: September 09, 1924 Referring Provider (PT): Dr. Lynne Leader /Dr. Charlann Boxer    Encounter Date: 11/09/2019   PT End of Session - 11/09/19 1608    Visit Number 1    Number of Visits 8    Date for PT Re-Evaluation 01/04/20    Authorization Type [employer], MCR- ?KX mod?    PT Start Time 1045    PT Stop Time 1130    PT Time Calculation (min) 45 min    Activity Tolerance Patient tolerated treatment well    Behavior During Therapy WFL for tasks assessed/performed           Past Medical History:  Diagnosis Date  . Arthritis   . Cancer [facility])    Colon  . Hyperlipidemia   . Hypertension   . Melanoma [facility])     Past Surgical History:  Procedure Laterality Date  . BALLOON DILATION N/A 04/02/2015   Procedure: BALLOON DILATION;  Surgeon: Bjorn Loser, MD;  [facility];  Service: Urology;  Laterality: N/A;  . CATARACT EXTRACTION    . COLON SURGERY     1981 - colon cancer  . CYSTOSCOPY WITH RETROGRADE PYELOGRAM, URETEROSCOPY AND STENT PLACEMENT N/A 04/02/2015   Procedure: CYSTO WITH RETROGRADE ;  Surgeon: Bjorn Loser, MD;  Location[facility];  Service: Urology;  Laterality: N/A;  . Left thub surgery    . TONSILLECTOMY    . URETHRAL STRICTURE DILATATION      There were no vitals filed for this visit.    Subjective Assessment - 11/09/19 1054    Subjective I have trouble geting up from sitting in chairs, mostly my recliner. I walk about 6 blocks and my back gets tired, sore. He has had spinal injections and hip injections over the past few years which have helped. He denies weakness, radicular pain.  Admits he stopped exercises after finishing PT a couple of years ago.  He remains overall healthy and fairly active for his age.  No  falls but has trouble getting up and down from the floor sometimes.    Pertinent History Hip and lumbar pain, chronic.  HTN.  Kidney Stent    Limitations Standing;Walking;House hold activities;Lifting    How long can you sit comfortably? not limited    How long can you walk comfortably? 5-10 min painful, gets tired and too uncomfortable to continue    Diagnostic tests MRI 5/2021Lumbar spine degeneration with multifactorial mild spinalstenosis but moderate to severe lateral recess and neural foraminalstenosis at L3-L4 through L5-S1    Patient Stated Goals Patient would like to relieve this soreness in my back.    Currently in Pain? Yes    Pain Score 5     Pain Location Back    Pain Orientation Right;Left;Lower    Pain Descriptors / Indicators Sore    Pain Type Chronic pain    Pain Onset More than a month ago    Pain Frequency Intermittent    Aggravating Factors  sit to stand, walk    Pain Relieving Factors sitting, rest, ice    Effect of Pain on Daily Activities limits comfort    Multiple Pain Sites No              [facility] PT Assessment - 11/09/19 0001  Assessment   Medical Diagnosis Lumbosacral arthritis, pain     Referring Provider (PT) Dr. Lynne Leader /Dr. Charlann Boxer     Onset Date/Surgical Date --   chronic   Prior Therapy Yes 2019      Precautions   Precautions None      Restrictions   Weight Bearing Restrictions No      Balance Screen   Has the patient fallen in the past 6 months No      [place] residence    Living Arrangements Spouse/significant other    Additional Comments spouse with some health issues, both are independent with ADLs and mobility       Prior Function   Level of Independence Independent    Vocation Retired    Leisure used to golf, likes to walk, use the computer, read       Cognition   Overall Cognitive Status Within Functional Limits for tasks assessed      Sensation   Light Touch Appears Intact        Coordination   Gross Motor Movements are Fluid and Coordinated Not tested      Posture/Postural Control   Posture/Postural Control Postural limitations    Postural Limitations Rounded Shoulders;Forward head;Increased thoracic kyphosis;Posterior pelvic tilt      AROM   Lumbar Flexion distal shin sore Rt side     Lumbar Extension 50% sore Rt side     Lumbar - Right Side Bend WFL    Lumbar - Left Side Bend pain on Rt     Lumbar - Right Rotation WFL    Lumbar - Left Rotation pain on R       PROM   Overall PROM Comments hips WFL, x slight decr IR bilateral       Strength   Right Hip Flexion 4+/5    Right Hip ABduction 4/5    Left Hip Flexion 4+/5    Left Hip ABduction 4/5    Right Knee Flexion 5/5    Right Knee Extension 5/5    Left Knee Flexion 5/5    Left Knee Extension 5/5      Flexibility   Hamstrings WFL     Piriformis tight R with pain , spasm      Palpation   Spinal mobility min hypomobile in lumbar spine     Palpation comment soreness along L5-S1 and into Rt gluteals, piriformis      Transfers   Five time sit to stand comments  10 sec     Comments no hands from mat       Ambulation/Gait   Ambulation Distance (Feet) 150 Feet    Assistive device None    Gait Pattern Step-through pattern;Antalgic    Ambulation Surface Level          Objective measurements completed on examination: See above findings.      PT Education - 11/09/19 1607    Education Details PT/POC, HEP, pain vs soreness, eval findings, walking program , lumbar support in recliner    Person(s) Educated Patient    Methods Explanation;Demonstration;Handout    Comprehension Verbalized understanding;Returned demonstration            PT Short Term Goals - 11/09/19 1609      PT SHORT TERM GOAL #1   Title Pt will understand FOTO results and potential to improve function    Time 4    Period Weeks    Status New  Target Date 12/07/19      PT SHORT TERM GOAL #2   Title Pt will be I  with HEP for low back, hips    Time 4    Period Weeks    Status New    Target Date 12/07/19      PT SHORT TERM GOAL #3   Title Pt will note improved ability to rise from the chair after sitting > 30 min (less soreness)    Time 4    Period Weeks    Status New    Target Date 12/07/19             PT Long Term Goals - 11/09/19 1611      PT LONG TERM GOAL #1   Title Pt will be able to show Independence with HEP    Time 8    Period Weeks    Status New    Target Date 01/04/20      PT LONG TERM GOAL #2   Title Pt will be able to get up from the floor using chair to push up to standing    Time 8    Period Weeks    Status New    Target Date 01/04/20      PT LONG TERM GOAL #3   Title Pt will improve his FOTO score to 65% or better    Baseline 63%    Target Date 01/04/20      PT LONG TERM GOAL #4   Title Pt will be able to walk for 20 min and have no increased Rt sided low back pain    Time 8    Period Weeks    Status New    Target Date 01/04/20      PT LONG TERM GOAL #5   Title Pt will increase Rt hip abd/glute med  strength to 5/5 to improve gait and pelvic stability                  Plan - 11/09/19 [place] has had a flare up of a condition we treated back about 2 years ago.  He is remarkably mobile and strong with good balance and coordination. He is limited by arthritic condition in low lumbar/sacral spine, experiencing stiffness with stillness for any length of time. He was advised to walk for general health, get into a stretching routine and apply heat, ice as needed at home.  He will be given corrective exercises modalities for pain and functional training to maximize capacity for maintaining his home and his health.  He improved a great deal before and he should do well with this PT episode.    Personal Factors and Comorbidities Age;Comorbidity 1;Time since onset of injury/illness/exacerbation    Comorbidities chronic hip  and back pain    Examination-Activity Limitations Squat;Bed Mobility;Bend;Locomotion Level;Stand    Examination-Participation Forensic scientist;Yard Work;Interpersonal Relationship    Stability/Clinical Decision Making Stable/Uncomplicated    Clinical Decision Making Low    Rehab Potential Excellent    PT Frequency 1x / week    PT Duration 8 weeks    PT Treatment/Interventions ADLs/Self Care Home Management;Therapeutic activities;Patient/family education;Therapeutic exercise;Neuromuscular re-education;Manual techniques;Dry needling;Passive range of motion;Functional mobility training;Ultrasound;Cryotherapy;Moist Heat;Electrical Stimulation;Balance training    PT Next Visit Plan check HEP , consider mini best test, DGI. manual R    PT Home Exercise Plan LTR with knees crossed, quad rocking and knee to chest    Consulted and Agree  with Plan of Care Patient           Patient will benefit from skilled therapeutic intervention in order to improve the following deficits and impairments:  Increased fascial restricitons, Pain, Obesity, Impaired flexibility, Decreased range of motion, Decreased strength, Decreased mobility, Hypomobility  Visit Diagnosis: Muscle weakness (generalized)  Abnormal posture  Difficulty in walking, not elsewhere classified  Chronic bilateral low back pain without sciatica     Problem List Patient Active Problem List   Diagnosis Date Noted  . Arthritis of sacroiliac joint of both sides 07/25/2019  . Degenerative disc disease, lumbar 05/08/2019  . Anserine bursitis 12/20/2018  . Gluteal tendinitis of right buttock 10/13/2017  . Right hip pain 09/16/2017  . Right knee sprain 02/27/2015    Kanasia Gayman 11/09/2019, 4:24 PM  [facility] [address] [place], [place], [address] Phone: [phone]   Fax:  [phone]  Name: Jesus Kim MRN: [id number] Date of Birth: 07-30-1924    Raeford Razor, PT 11/09/19 4:31 PM Phone: [phone] Fax: [phone]

## 2019-11-13 ENCOUNTER — Ambulatory Visit: Payer: PPO | Admitting: Physical Therapy

## 2019-11-15 ENCOUNTER — Encounter (HOSPITAL_COMMUNITY): Payer: Self-pay

## 2019-11-15 ENCOUNTER — Observation Stay (HOSPITAL_COMMUNITY): Payer: PPO

## 2019-11-15 ENCOUNTER — Inpatient Hospital Stay (HOSPITAL_COMMUNITY)
Admission: EM | Admit: 2019-11-15 | Discharge: 2019-11-17 | DRG: 690 | Disposition: A | Discharge status: Home / Self Care | Payer: PPO | Attending: Internal Medicine | Admitting: Internal Medicine

## 2019-11-15 ENCOUNTER — Other Ambulatory Visit: Payer: Self-pay

## 2019-11-15 DIAGNOSIS — N3000 Acute cystitis without hematuria: Secondary | ICD-10-CM | POA: Diagnosis not present

## 2019-11-15 DIAGNOSIS — R7989 Other specified abnormal findings of blood chemistry: Secondary | ICD-10-CM

## 2019-11-15 DIAGNOSIS — N390 Urinary tract infection, site not specified: Secondary | ICD-10-CM | POA: Diagnosis not present

## 2019-11-15 DIAGNOSIS — Z87891 Personal history of nicotine dependence: Secondary | ICD-10-CM | POA: Diagnosis not present

## 2019-11-15 DIAGNOSIS — Z85038 Personal history of other malignant neoplasm of large intestine: Secondary | ICD-10-CM

## 2019-11-15 DIAGNOSIS — N136 Pyonephrosis: Principal | ICD-10-CM | POA: Diagnosis present

## 2019-11-15 DIAGNOSIS — R000 Tachycardia, unspecified: Secondary | ICD-10-CM | POA: Diagnosis not present

## 2019-11-15 DIAGNOSIS — R300 Dysuria: Secondary | ICD-10-CM | POA: Diagnosis not present

## 2019-11-15 DIAGNOSIS — Z66 Do not resuscitate: Secondary | ICD-10-CM | POA: Diagnosis present

## 2019-11-15 DIAGNOSIS — N400 Enlarged prostate without lower urinary tract symptoms: Secondary | ICD-10-CM | POA: Diagnosis present

## 2019-11-15 DIAGNOSIS — N189 Chronic kidney disease, unspecified: Secondary | ICD-10-CM | POA: Diagnosis not present

## 2019-11-15 DIAGNOSIS — E785 Hyperlipidemia, unspecified: Secondary | ICD-10-CM | POA: Diagnosis present

## 2019-11-15 DIAGNOSIS — Z85820 Personal history of malignant melanoma of skin: Secondary | ICD-10-CM

## 2019-11-15 DIAGNOSIS — B964 Proteus (mirabilis) (morganii) as the cause of diseases classified elsewhere: Secondary | ICD-10-CM | POA: Diagnosis not present

## 2019-11-15 DIAGNOSIS — E871 Hypo-osmolality and hyponatremia: Secondary | ICD-10-CM | POA: Diagnosis present

## 2019-11-15 DIAGNOSIS — N184 Chronic kidney disease, stage 4 (severe): Secondary | ICD-10-CM | POA: Diagnosis present

## 2019-11-15 DIAGNOSIS — Z20822 Contact with and (suspected) exposure to covid-19: Secondary | ICD-10-CM | POA: Diagnosis present

## 2019-11-15 DIAGNOSIS — N179 Acute kidney failure, unspecified: Secondary | ICD-10-CM | POA: Diagnosis not present

## 2019-11-15 DIAGNOSIS — E872 Acidosis: Secondary | ICD-10-CM | POA: Diagnosis present

## 2019-11-15 DIAGNOSIS — I129 Hypertensive chronic kidney disease with stage 1 through stage 4 chronic kidney disease, or unspecified chronic kidney disease: Secondary | ICD-10-CM | POA: Diagnosis not present

## 2019-11-15 DIAGNOSIS — N35919 Unspecified urethral stricture, male, unspecified site: Secondary | ICD-10-CM | POA: Diagnosis not present

## 2019-11-15 DIAGNOSIS — N1330 Unspecified hydronephrosis: Secondary | ICD-10-CM | POA: Diagnosis not present

## 2019-11-15 LAB — COMPREHENSIVE METABOLIC PANEL
ALT: 105 U/L — ABNORMAL HIGH (ref 0–44)
AST: 93 U/L — ABNORMAL HIGH (ref 15–41)
Albumin: 3.5 g/dL (ref 3.5–5.0)
Alkaline Phosphatase: 51 U/L (ref 38–126)
Anion gap: 10 (ref 5–15)
BUN: 41 mg/dL — ABNORMAL HIGH (ref 8–23)
CO2: 20 mmol/L — ABNORMAL LOW (ref 22–32)
Calcium: 8.1 mg/dL — ABNORMAL LOW (ref 8.9–10.3)
Chloride: 102 mmol/L (ref 98–111)
Creatinine, Ser: 2.88 mg/dL — ABNORMAL HIGH (ref 0.61–1.24)
GFR, Estimated: 20 mL/min — ABNORMAL LOW (ref >60)
Glucose, Bld: 145 mg/dL — ABNORMAL HIGH (ref 70–99)
Potassium: 4.9 mmol/L (ref 3.5–5.1)
Sodium: 132 mmol/L — ABNORMAL LOW (ref 135–145)
Total Bilirubin: 0.7 mg/dL (ref 0.3–1.2)
Total Protein: 7 g/dL (ref 6.5–8.1)

## 2019-11-15 LAB — CBC
HCT: 39.7 % (ref 39.0–52.0)
Hemoglobin: 13.2 g/dL (ref 13.0–17.0)
MCH: 32.1 pg (ref 26.0–34.0)
MCHC: 33.2 g/dL (ref 30.0–36.0)
MCV: 96.6 fL (ref 80.0–100.0)
Platelets: 203 10*3/uL (ref 150–400)
RBC: 4.11 MIL/uL — ABNORMAL LOW (ref 4.22–5.81)
RDW: 13.2 % (ref 11.5–15.5)
WBC: 8.9 10*3/uL (ref 4.0–10.5)
nRBC: 0 % (ref 0.0–0.2)

## 2019-11-15 LAB — URINALYSIS, ROUTINE W REFLEX MICROSCOPIC
Bilirubin Urine: NEGATIVE
Glucose, UA: NEGATIVE mg/dL
Ketones, ur: 5 mg/dL — AB
Nitrite: NEGATIVE
Protein, ur: 100 mg/dL — AB
Specific Gravity, Urine: 1.011 (ref 1.005–1.030)
WBC, UA: >50 WBC/hpf — ABNORMAL HIGH (ref 0–5)
pH: 8 (ref 5.0–8.0)

## 2019-11-15 LAB — RESPIRATORY PANEL BY RT PCR (FLU A&B, COVID)
Influenza A by PCR: NEGATIVE
Influenza B by PCR: NEGATIVE
SARS Coronavirus 2 by RT PCR: NEGATIVE

## 2019-11-15 LAB — LIPASE, BLOOD: Lipase: 26 U/L (ref 11–51)

## 2019-11-15 LAB — LACTIC ACID, PLASMA: Lactic Acid, Venous: 1.5 mmol/L (ref 0.5–1.9)

## 2019-11-15 LAB — TSH: TSH: 1.277 u[IU]/mL (ref 0.350–4.500)

## 2019-11-15 MED ORDER — ONDANSETRON HCL 4 MG/2ML IJ SOLN
4.0000 mg | Freq: Once | INTRAMUSCULAR | Status: AC
  Administered 2019-11-15: 4 mg via INTRAVENOUS
  Filled 2019-11-15: qty 2

## 2019-11-15 MED ORDER — ACETAMINOPHEN 325 MG PO TABS: 650.0000 mg | ORAL_TABLET | Freq: Four times a day (QID) | ORAL | Status: DC | PRN

## 2019-11-15 MED ORDER — HEPARIN SODIUM (PORCINE) 5000 UNIT/ML IJ SOLN
5000.0000 [IU] | Freq: Three times a day (TID) | INTRAMUSCULAR | Status: DC
  Administered 2019-11-15 – 2019-11-17 (×6): 5000 [IU] via SUBCUTANEOUS
  Filled 2019-11-15 (×6): qty 1

## 2019-11-15 MED ORDER — ONDANSETRON HCL 4 MG/2ML IJ SOLN
4.0000 mg | Freq: Four times a day (QID) | INTRAMUSCULAR | Status: DC | PRN
  Administered 2019-11-16: 4 mg via INTRAVENOUS
  Filled 2019-11-15 (×2): qty 2

## 2019-11-15 MED ORDER — SODIUM CHLORIDE 0.9 % IV SOLN: Freq: Once | INTRAVENOUS | Status: AC

## 2019-11-15 MED ORDER — SODIUM CHLORIDE 0.9 % IV SOLN
1.0000 g | Freq: Every day | INTRAVENOUS | Status: DC
  Administered 2019-11-15 – 2019-11-17 (×3): 1 g via INTRAVENOUS
  Filled 2019-11-15: qty 1
  Filled 2019-11-15: qty 10
  Filled 2019-11-15: qty 1

## 2019-11-15 MED ORDER — ONDANSETRON HCL 4 MG PO TABS
4.0000 mg | ORAL_TABLET | Freq: Four times a day (QID) | ORAL | Status: DC | PRN
  Administered 2019-11-16 (×2): 4 mg via ORAL
  Filled 2019-11-15 (×3): qty 1

## 2019-11-15 MED ORDER — AMLODIPINE BESYLATE 5 MG PO TABS
2.5000 mg | ORAL_TABLET | Freq: Two times a day (BID) | ORAL | Status: DC
  Administered 2019-11-16 – 2019-11-17 (×4): 2.5 mg via ORAL
  Filled 2019-11-15 (×4): qty 1

## 2019-11-15 MED ORDER — ACETAMINOPHEN 650 MG RE SUPP: 650.0000 mg | Freq: Four times a day (QID) | RECTAL | Status: DC | PRN

## 2019-11-15 MED ORDER — SODIUM CHLORIDE 0.9 % IV BOLUS
1000.0000 mL | Freq: Once | INTRAVENOUS | Status: AC
  Administered 2019-11-15: 1000 mL via INTRAVENOUS

## 2019-11-15 MED ORDER — FINASTERIDE 5 MG PO TABS
5.0000 mg | ORAL_TABLET | Freq: Every evening | ORAL | Status: DC
  Administered 2019-11-15 – 2019-11-16 (×2): 5 mg via ORAL
  Filled 2019-11-15 (×3): qty 1

## 2019-11-15 NOTE — ED Notes (Signed)
Report given to Pam, RN.

## 2019-11-15 NOTE — ED Triage Notes (Signed)
Patient c/o diarrhea and nausea x 4 days. Patient denies any abdominal pain. Patient reports that he was recently treated for a UTI.

## 2019-11-15 NOTE — Progress Notes (Signed)
   11/15/19 1634  Assess: MEWS Score  Temp 98.3 F (36.8 C)  BP (!) 152/93  Pulse Rate (!) 130 (RN was notify.)  Resp 20  SpO2 99 %  Assess: MEWS Score  MEWS Temp 0  MEWS Systolic 0  MEWS Pulse 3  MEWS RR 0  MEWS LOC 0  MEWS Score 3  MEWS Score Color Yellow  Assess: if the MEWS score is Yellow or Red  Were vital signs taken at a resting state? Yes  Focused Assessment No change from prior assessment  Early Detection of Sepsis Score *See Row Information* Low  MEWS guidelines implemented *See Row Information* No, previously yellow, continue vital signs every 4 hours  Notify: Charge Nurse/RN  Name of Charge Nurse/RN Notified  Mirna Mires)  Date Charge Nurse/RN Notified 11/15/19  Time Charge Nurse/RN Notified 1900

## 2019-11-15 NOTE — ED Provider Notes (Signed)
[facility] DEPT Provider Note   CSN: [id number] Arrival date & time: 11/15/19  1119     History Chief Complaint  Patient presents with  . Diarrhea  . Nausea    Jesus Kim is a 86 y.o. male.  The history is provided by the patient and medical records. No language interpreter was used.  Diarrhea    86 year old male significant history of colon cancer, hypertension, hyperlipidemia presented to ED for evaluation of nausea and diarrhea.  Patient states he has history of urethral stricture leading to recurrent urinary tract infection every 1-2 months.  He does have a standing antibiotic ?doxycycline at home that he takes whenever he has symptoms.  Last week he developed some burning on urination.  He started taking his doxycycline for few days and subsequently developed some associated nausea and diarrhea.  He has had approximately 3-4 episodes of loose stools within the past 3 days, once a day.  He felt that this may be due to recent antibiotic use and therefore he stopped his doxycycline.  He mentioned that his dysuria has improved but due to the dry heaving diarrhea he decided to come here for further evaluation.  He does endorse some decrease in appetite but denies having fever, chills, headache, URI symptoms, chest pain, shortness of breath, productive cough, abdominal pain, back pain.  He has been fully vaccinated for Covid and flu.  Past Medical History:  Diagnosis Date  . Arthritis   . Cancer [facility])    Colon  . Hyperlipidemia   . Hypertension   . Melanoma [facility])     Patient Active Problem List   Diagnosis Date Noted  . Arthritis of sacroiliac joint of both sides 07/25/2019  . Degenerative disc disease, lumbar 05/08/2019  . Anserine bursitis 12/20/2018  . Gluteal tendinitis of right buttock 10/13/2017  . Right hip pain 09/16/2017  . Right knee sprain 02/27/2015    Past Surgical History:  Procedure Laterality Date  . BALLOON DILATION N/A  04/02/2015   Procedure: BALLOON DILATION;  Surgeon: Bjorn Loser, MD;  Location: [facility] ORS;  Service: Urology;  Laterality: N/A;  . CATARACT EXTRACTION    . COLON SURGERY     1981 - colon cancer  . CYSTOSCOPY WITH RETROGRADE PYELOGRAM, URETEROSCOPY AND STENT PLACEMENT N/A 04/02/2015   Procedure: CYSTO WITH RETROGRADE ;  Surgeon: Bjorn Loser, MD;  Location: [facility] ORS;  Service: Urology;  Laterality: N/A;  . Left thub surgery    . TONSILLECTOMY    . URETHRAL STRICTURE DILATATION         History reviewed. No pertinent family history.  Social History   Tobacco Use  . Smoking status: Former Smoker    Quit date: 01/31/1977    Years since quitting: 42.8  . Smokeless tobacco: Never Used  Vaping Use  . Vaping Use: Never used  Substance Use Topics  . Alcohol use: Yes    Alcohol/week: 1.0 standard drink    Types: 1 Glasses of wine per week  . Drug use: No    Home Medications Prior to Admission medications   Medication Sig Start Date End Date Taking? Authorizing Provider  acetaminophen (TYLENOL) 500 MG tablet Take 500-1,000 mg by mouth every 6 (six) hours as needed for moderate pain.    [provider]  amLODipine (NORVASC) 2.5 MG tablet Take 2.5 mg by mouth 2 (two) times daily.    [provider]  diphenhydramine-acetaminophen (TYLENOL PM) 25-500 MG TABS tablet Take 1 tablet by mouth  at bedtime.    [provider]  finasteride (PROSCAR) 5 MG tablet Take 5 mg by mouth daily. Evening    [provider]  gabapentin (NEURONTIN) 100 MG capsule TAKE TWO CAPSULES BY MOUTH AT BEDTIME 10/17/19   [name], DO  lisinopril (PRINIVIL,ZESTRIL) 20 MG tablet Take 20 mg by mouth daily.     [provider]  Multiple Vitamins-Minerals (PRESERVISION AREDS 2+MULTI VIT PO) Take 2 tablets by mouth daily.    [provider]  simvastatin (ZOCOR) 20 MG tablet Take 20 mg by mouth daily.    [provider]  traMADol (ULTRAM) 50 MG tablet Take  1 tablet (50 mg total) by mouth every 8 (eight) hours as needed for severe pain. 05/15/19   Gregor Hams, MD    Allergies    Sulfa antibiotics  Review of Systems   Review of Systems  Gastrointestinal: Positive for diarrhea.  All other systems reviewed and are negative.   Physical Exam Updated Vital Signs BP (!) 142/85 (BP Location: Left Arm)   Pulse (!) 129   Temp 98.8 F (37.1 C) (Oral)   Resp 18   Ht 5\' 6"  (1.676 m)   Wt 74.8 kg   SpO2 98%   BMI 26.63 kg/m   Physical Exam Vitals and nursing note reviewed.  Constitutional:      General: He is not in acute distress.    Appearance: He is well-developed.     Comments: Elderly male alert oriented in no acute discomfort.  HENT:     Head: Atraumatic.  Eyes:     Conjunctiva/sclera: Conjunctivae normal.  Cardiovascular:     Rate and Rhythm: Tachycardia present.     Pulses: Normal pulses.     Heart sounds: Normal heart sounds.  Pulmonary:     Effort: Pulmonary effort is normal.     Breath sounds: Normal breath sounds.     Comments: Mildly tachypneic.  Lungs clear on auscultation bilaterally. Abdominal:     General: There is no distension.     Palpations: Abdomen is soft.     Tenderness: There is no abdominal tenderness.  Musculoskeletal:     Cervical back: Neck supple.  Skin:    Findings: No rash.  Neurological:     Mental Status: He is alert and oriented to person, place, and time.  Psychiatric:        Mood and Affect: Mood normal.     ED Results / Procedures / Treatments   Labs (all labs ordered are listed, but only abnormal results are displayed) Labs Reviewed  COMPREHENSIVE METABOLIC PANEL - Abnormal; Notable for the following components:      Result Value   Sodium 132 (*)    CO2 20 (*)    Glucose, Bld 145 (*)    BUN 41 (*)    Creatinine, Ser 2.88 (*)    Calcium 8.1 (*)    AST 93 (*)    ALT 105 (*)    GFR, Estimated 20 (*)    All other components within normal limits  CBC - Abnormal; Notable for  the following components:   RBC 4.11 (*)    All other components within normal limits  URINALYSIS, ROUTINE W REFLEX MICROSCOPIC - Abnormal; Notable for the following components:   APPearance CLOUDY (*)    Hgb urine dipstick SMALL (*)    Ketones, ur 5 (*)    Protein, ur 100 (*)    Leukocytes,Ua LARGE (*)    WBC, UA >  50 (*)    Bacteria, UA RARE (*)    All other components within normal limits  RESPIRATORY PANEL BY RT PCR (FLU A&B, COVID)  C DIFFICILE QUICK SCREEN W PCR REFLEX  URINE CULTURE  LIPASE, BLOOD  LACTIC ACID, PLASMA  LACTIC ACID, PLASMA    EKG EKG Interpretation  Date/Time:  Wednesday November 15 2019 12:39:31 EDT Ventricular Rate:  119 PR Interval:    QRS Duration: 82 QT Interval:  313 QTC Calculation: 441 R Axis:   35 Text Interpretation: Sinus tachycardia Abnormal R-wave progression, early transition rate is faster compared to 2017 Confirmed by Sherwood Gambler [phone]) on 11/15/2019 1:23:00 PM   Radiology No results found.  Procedures Procedures (including critical care time)  Medications Ordered in ED Medications  sodium chloride 0.9 % bolus 1,000 mL (1,000 mLs Intravenous New Bag/Given 11/15/19 1251)  ondansetron (ZOFRAN) injection 4 mg (4 mg Intravenous Given 11/15/19 1237)    ED Course  I have reviewed the triage vital signs and the nursing notes.  Pertinent labs & imaging results that were available during my care of the patient were reviewed by me and considered in my medical decision making (see chart for details).    MDM Rules/Calculators/A&P                          BP 137/71   Pulse (!) 113   Temp 98.8 F (37.1 C) (Oral)   Resp (!) 26   Ht 5\' 6"  (1.676 m)   Wt 74.8 kg   SpO2 97%   BMI 26.63 kg/m   Final Clinical Impression(s) / ED Diagnoses Final diagnoses:  Lower urinary tract infectious disease  Tachycardia    Rx / DC Orders ED Discharge Orders    None     12:15 PM Patient report having bouts of dry heaving as well as  having some loose stools for the past few days.  Recently has some dysuria in which he takes doxycycline prior to his onset of diarrhea.  No complaints of abdominal pain or fever.  Otherwise suspicion for C. difficile is low as patient only has one episode of diarrhea per day, in the setting of regular antibiotic use, will check C. difficile.  Patient was noted to be tachycardic with a heart rate of 129.  He mention he has had history of tachycardia and his baseline heart rate is usually 116.  No chest pain or shortness of breath or productive cough.  He has been fully vaccinated for COVID-19.  He has a benign abdominal exam.  Urine appears cloudy.  Will check labs UA and C. difficile.  IV fluid given.  2:17 PM Labs remarkable for impaired renal function with BUN 41, creatinine 2.88, this is similar to his prior values.  Evidence of transaminitis with AST 93, ALT 105 similar to prior as well.  Normal WBC, normal lactic acid, COVID-19 test is negative.  UA shows cloudy urine large leukocyte esterase and greater than 50 WBC consistent with UTI.  Patient was given Rocephin IV.  He also received IV fluid and tachycardia did improved.  EKG shows sinus tachycardia.  Since patient does not complain of any chest pain shortness of breath or productive cough, chest x-ray was not obtained.  Given his age, ongoing urinary tract infection, tachycardia, I have discussed with Dr. Regenia Skeeter and a plan is to have patient admitted for further management.  Patient voiced understanding and agrees with plan.  C. difficile test  have not resulted  2:26 PM Appreciate consultation from [facility] Dr. Marylyn Ishihara who agrees to see and admit pt for further management of his sxs.     Domenic Moras, PA-C 11/15/19 [place], MD 11/15/19 1530

## 2019-11-15 NOTE — H&P (Signed)
History and Physical    Jesus Kim VPX:[id number] DOB: 09/15/1924 DOA: 11/15/2019  PCP: [name], MD  Patient coming from: Home  Chief Complaint: urinary burning and increased frequency.  HPI: Jesus Kim is a 86 y.o. male with medical history significant of HTN, HLD, BPH, urethral stricture, recurrent UTI. Follows with [facility] (last seen in June). Presenting with increased frequency and burning with urination over the last 5 days. He reports that he has PRN doxycycline from his urologist that he takes when he starts having a burning sensation and increased frequency. Typically this clears up his symptoms after a few tablets. He tried it this time, but it did not work. He reports that he now has loose stools, nausea and fever after continuing the antibiotics.He reports no other alleviating or aggravating factors. He became concerned with his ongoing symptoms and decided to come to the ED.   ED Course: He was found to have tachycardia and a UA suspicious for UTI. [facility] was called for admission.  Review of Systems:  Denies CP, dyspnea, palpitations, HA, syncopal episodes, hematuria, urethral discharge. Review of systems is otherwise negative for all not mentioned in HPI.   PMHx Past Medical History:  Diagnosis Date  . Arthritis   . Cancer [facility])    Colon  . Hyperlipidemia   . Hypertension   . Melanoma ([facility])     PSHx Past Surgical History:  Procedure Laterality Date  . BALLOON DILATION N/A 04/02/2015   Procedure: BALLOON DILATION;  Surgeon: Bjorn Loser, MD;  Location: [facility];  Service: Urology;  Laterality: N/A;  . CATARACT EXTRACTION    . COLON SURGERY     1981 - colon cancer  . CYSTOSCOPY WITH RETROGRADE PYELOGRAM, URETEROSCOPY AND STENT PLACEMENT N/A 04/02/2015   Procedure: CYSTO WITH RETROGRADE ;  Surgeon: Bjorn Loser, MD;  Location: [facility];  Service: Urology;  Laterality: N/A;  . Left thub surgery    . TONSILLECTOMY    . URETHRAL STRICTURE DILATATION       SocHx  reports that he quit smoking about 42 years ago. He has never used smokeless tobacco. He reports current alcohol use of about 1.0 standard drink of alcohol per week. He reports that he does not use drugs.  Allergies  Allergen Reactions  . Sulfa Antibiotics Rash    FamHx History reviewed. No pertinent family history.  Prior to Admission medications   Medication Sig Start Date End Date Taking? Authorizing Provider  acetaminophen (TYLENOL) 500 MG tablet Take 500-1,000 mg by mouth every 6 (six) hours as needed for moderate pain.    [provider]  amLODipine (NORVASC) 2.5 MG tablet Take 2.5 mg by mouth 2 (two) times daily.    [provider]  diphenhydramine-acetaminophen (TYLENOL PM) 25-500 MG TABS tablet Take 1 tablet by mouth at bedtime.    [provider]  finasteride (PROSCAR) 5 MG tablet Take 5 mg by mouth daily. Evening    [provider]  gabapentin (NEURONTIN) 100 MG capsule TAKE TWO CAPSULES BY MOUTH AT BEDTIME 10/17/19   [name], DO  lisinopril (PRINIVIL,ZESTRIL) 20 MG tablet Take 20 mg by mouth daily.     [provider]  Multiple Vitamins-Minerals (PRESERVISION AREDS 2+MULTI VIT PO) Take 2 tablets by mouth daily.    [provider]  simvastatin (ZOCOR) 20 MG tablet Take 20 mg by mouth daily.    [provider]  traMADol (ULTRAM) 50 MG tablet Take 1 tablet (50 mg total) by  mouth every 8 (eight) hours as needed for severe pain. 05/15/19   Gregor Hams, MD    Physical Exam: Vitals:   11/15/19 1202 11/15/19 1212 11/15/19 1300 11/15/19 1330  BP: (!) 146/73 (!) 146/73 111/86 137/71  Pulse:  (!) 119 (!) 112 (!) 113  Resp:  (!) 27 (!) 22 (!) 26  Temp:      TempSrc:      SpO2:  99% 96% 97%  Weight:      Height:        General: 86 y.o. male resting in bed in NAD Eyes: PERRL, normal sclera ENMT: Nares patent w/o discharge, orophaynx clear, dentition normal, ears w/o discharge/lesions/ulcers Neck:  Supple, trachea midline Cardiovascular: tachy, +S1, S2, no m/g/r, equal pulses throughout Respiratory: CTABL, no w/r/r, normal WOB GI: BS+, NDNT, no masses noted, no organomegaly noted MSK: No e/c/c Skin: No rashes, bruises, ulcerations noted Neuro: A&O x 3, no focal deficits Psyc: Appropriate interaction and affect, calm/cooperative  Labs on Admission: I have personally reviewed following labs and imaging studies  CBC: Recent Labs  Lab 11/15/19 1236  WBC 8.9  HGB 13.2  HCT 39.7  MCV 96.6  PLT 962   Basic Metabolic Panel: Recent Labs  Lab 11/15/19 1236  NA 132*  K 4.9  CL 102  CO2 20*  GLUCOSE 145*  BUN 41*  CREATININE 2.88*  CALCIUM 8.1*   GFR: Estimated Creatinine Clearance: 14.5 mL/min (A) (by C-G formula based on SCr of 2.88 mg/dL (H)). Liver Function Tests: Recent Labs  Lab 11/15/19 1236  AST 93*  ALT 105*  ALKPHOS 51  BILITOT 0.7  PROT 7.0  ALBUMIN 3.5   Recent Labs  Lab 11/15/19 1236  LIPASE 26   No results for input(s): AMMONIA in the last 168 hours. Coagulation Profile: No results for input(s): INR, PROTIME in the last 168 hours. Cardiac Enzymes: No results for input(s): CKTOTAL, CKMB, CKMBINDEX, TROPONINI in the last 168 hours. BNP (last 3 results) No results for input(s): PROBNP in the last 8760 hours. HbA1C: No results for input(s): HGBA1C in the last 72 hours. CBG: No results for input(s): GLUCAP in the last 168 hours. Lipid Profile: No results for input(s): CHOL, HDL, LDLCALC, TRIG, CHOLHDL, LDLDIRECT in the last 72 hours. Thyroid Function Tests: No results for input(s): TSH, T4TOTAL, FREET4, T3FREE, THYROIDAB in the last 72 hours. Anemia Panel: No results for input(s): VITAMINB12, FOLATE, FERRITIN, TIBC, IRON, RETICCTPCT in the last 72 hours. Urine analysis:    Component Value Date/Time   COLORURINE YELLOW 11/15/2019 1137   APPEARANCEUR CLOUDY (A) 11/15/2019 1137   LABSPEC 1.011 11/15/2019 1137   PHURINE 8.0 11/15/2019 1137    GLUCOSEU NEGATIVE 11/15/2019 1137   HGBUR SMALL (A) 11/15/2019 1137   BILIRUBINUR NEGATIVE 11/15/2019 1137   KETONESUR 5 (A) 11/15/2019 1137   PROTEINUR 100 (A) 11/15/2019 1137   UROBILINOGEN 1.0 01/02/2008 1155   NITRITE NEGATIVE 11/15/2019 1137   LEUKOCYTESUR LARGE (A) 11/15/2019 1137    Radiological Exams on Admission: No results found.  EKG: Independently reviewed. Sinus tach  Assessment/Plan Recurrent UTI Hx of urethral stricture     - last seen by [facility] for stricture in June; at that time he was given PRN doxy for symptoms (burning and increased frequency) per [facility]; "watchful waiting"approach     - UCx is cooking, no recent culture data to go off of in the interim     - start rocephin for now; adjust meds w/ culture data     -  consider urology consult in-house, but per patient, he has been told by urology that there isn't anything else that can be done.     - continue finesteride  Tachycardia     - EKG w/ sinus tach     - give fluids     - check TSH, follow  CKD 4     - baseline Scr data is questionable d/t last results from 2019; however her GFR is consistently CKD4.     - watch nephrotoxins     - he remains on lisinopril outpt; will hold for now  HTN     - continue amlodipine, hold lisinopril  Hyponatremia     - fluids, follow  Elevated transaminases     - check hep panel     - check [place] ab     - hold zocor for now  Loose stools     - 1 episode per day of loose stools     - ED has ordered c diff, follow  DVT prophylaxis: heparin  Code Status: FULL  Family Communication: With wife at bedside.  Consults called: None  Admission status: Observation  Status is: Observation  The patient remains OBS appropriate and will d/c before 2 midnights.  Dispo: The patient is from: Home              Anticipated d/c is to: Home              Anticipated d/c date is: 1 day              Patient currently is not medically stable to d/c.  Jonnie Finner  DO [facility]  If 7PM-7AM, please contact night-coverage [email or web address]  11/15/2019, 2:45 PM

## 2019-11-16 DIAGNOSIS — N3000 Acute cystitis without hematuria: Secondary | ICD-10-CM

## 2019-11-16 DIAGNOSIS — Z20822 Contact with and (suspected) exposure to covid-19: Secondary | ICD-10-CM | POA: Diagnosis present

## 2019-11-16 DIAGNOSIS — N400 Enlarged prostate without lower urinary tract symptoms: Secondary | ICD-10-CM | POA: Diagnosis present

## 2019-11-16 DIAGNOSIS — N179 Acute kidney failure, unspecified: Secondary | ICD-10-CM | POA: Diagnosis not present

## 2019-11-16 DIAGNOSIS — N189 Chronic kidney disease, unspecified: Secondary | ICD-10-CM | POA: Diagnosis not present

## 2019-11-16 DIAGNOSIS — Z85820 Personal history of malignant melanoma of skin: Secondary | ICD-10-CM | POA: Diagnosis not present

## 2019-11-16 DIAGNOSIS — B964 Proteus (mirabilis) (morganii) as the cause of diseases classified elsewhere: Secondary | ICD-10-CM | POA: Diagnosis present

## 2019-11-16 DIAGNOSIS — N136 Pyonephrosis: Secondary | ICD-10-CM | POA: Diagnosis present

## 2019-11-16 DIAGNOSIS — E872 Acidosis: Secondary | ICD-10-CM | POA: Diagnosis present

## 2019-11-16 DIAGNOSIS — N35919 Unspecified urethral stricture, male, unspecified site: Secondary | ICD-10-CM | POA: Diagnosis not present

## 2019-11-16 DIAGNOSIS — I129 Hypertensive chronic kidney disease with stage 1 through stage 4 chronic kidney disease, or unspecified chronic kidney disease: Secondary | ICD-10-CM | POA: Diagnosis present

## 2019-11-16 DIAGNOSIS — N184 Chronic kidney disease, stage 4 (severe): Secondary | ICD-10-CM | POA: Diagnosis present

## 2019-11-16 DIAGNOSIS — Z66 Do not resuscitate: Secondary | ICD-10-CM | POA: Diagnosis present

## 2019-11-16 DIAGNOSIS — N1330 Unspecified hydronephrosis: Secondary | ICD-10-CM | POA: Diagnosis not present

## 2019-11-16 DIAGNOSIS — Z85038 Personal history of other malignant neoplasm of large intestine: Secondary | ICD-10-CM | POA: Diagnosis not present

## 2019-11-16 DIAGNOSIS — E785 Hyperlipidemia, unspecified: Secondary | ICD-10-CM | POA: Diagnosis present

## 2019-11-16 DIAGNOSIS — N390 Urinary tract infection, site not specified: Secondary | ICD-10-CM | POA: Diagnosis present

## 2019-11-16 DIAGNOSIS — Z87891 Personal history of nicotine dependence: Secondary | ICD-10-CM | POA: Diagnosis not present

## 2019-11-16 DIAGNOSIS — E871 Hypo-osmolality and hyponatremia: Secondary | ICD-10-CM | POA: Diagnosis present

## 2019-11-16 LAB — CBC
HCT: 37.5 % — ABNORMAL LOW (ref 39.0–52.0)
Hemoglobin: 12.5 g/dL — ABNORMAL LOW (ref 13.0–17.0)
MCH: 31.7 pg (ref 26.0–34.0)
MCHC: 33.3 g/dL (ref 30.0–36.0)
MCV: 95.2 fL (ref 80.0–100.0)
Platelets: 211 10*3/uL (ref 150–400)
RBC: 3.94 MIL/uL — ABNORMAL LOW (ref 4.22–5.81)
RDW: 13.2 % (ref 11.5–15.5)
WBC: 10 10*3/uL (ref 4.0–10.5)
nRBC: 0 % (ref 0.0–0.2)

## 2019-11-16 LAB — COMPREHENSIVE METABOLIC PANEL
ALT: 144 U/L — ABNORMAL HIGH (ref 0–44)
AST: 130 U/L — ABNORMAL HIGH (ref 15–41)
Albumin: 3.1 g/dL — ABNORMAL LOW (ref 3.5–5.0)
Alkaline Phosphatase: 46 U/L (ref 38–126)
Anion gap: 13 (ref 5–15)
BUN: 38 mg/dL — ABNORMAL HIGH (ref 8–23)
CO2: 15 mmol/L — ABNORMAL LOW (ref 22–32)
Calcium: 7.8 mg/dL — ABNORMAL LOW (ref 8.9–10.3)
Chloride: 104 mmol/L (ref 98–111)
Creatinine, Ser: 2.59 mg/dL — ABNORMAL HIGH (ref 0.61–1.24)
GFR, Estimated: 22 mL/min — ABNORMAL LOW (ref >60)
Glucose, Bld: 145 mg/dL — ABNORMAL HIGH (ref 70–99)
Potassium: 4.1 mmol/L (ref 3.5–5.1)
Sodium: 132 mmol/L — ABNORMAL LOW (ref 135–145)
Total Bilirubin: 0.8 mg/dL (ref 0.3–1.2)
Total Protein: 6.5 g/dL (ref 6.5–8.1)

## 2019-11-16 LAB — HEPATITIS PANEL, ACUTE
HCV Ab: NONREACTIVE
Hep A IgM: NONREACTIVE
Hep B C IgM: NONREACTIVE
Hepatitis B Surface Ag: NONREACTIVE

## 2019-11-16 MED ORDER — SODIUM CHLORIDE 0.9 % IV SOLN: INTRAVENOUS | Status: DC

## 2019-11-16 MED ORDER — SODIUM BICARBONATE 8.4 % IV SOLN
INTRAVENOUS | Status: DC
  Filled 2019-11-16 (×4): qty 100

## 2019-11-16 MED ORDER — ONDANSETRON HCL 4 MG/2ML IJ SOLN: 4.0000 mg | Freq: Four times a day (QID) | INTRAMUSCULAR | Status: DC | PRN

## 2019-11-16 MED ORDER — DIPHENHYDRAMINE HCL 25 MG PO CAPS
25.0000 mg | ORAL_CAPSULE | Freq: Every evening | ORAL | Status: DC | PRN
  Administered 2019-11-16: 25 mg via ORAL
  Filled 2019-11-16: qty 1

## 2019-11-16 NOTE — Progress Notes (Signed)
Patient ID: Jesus Kim, male   DOB: 14-Dec-1924, 86 y.o.   MRN: [id number] Creatinine 2.88 on presentation improving to 2.59 PROGRESS NOTE    Jesus Kim  MWU:[id number] DOB: 15-Aug-1924 DOA: 11/15/2019 PCP: [name], MD   Brief Narrative:  86 year old male with history of hypertension, hyperlipidemia, BPH, urethral stricture status post dilation in the past, recurrent UTI presented with increasing urinary burning and frequency despite using doxycycline as an outpatient.  In the ED, he was tachycardic and UA was suspicious for UTI.  He was started on IV Rocephin  Assessment & Plan:   Complicated UTI in a patient with history of urethral stricture and recurrent UTIs Bilateral moderate hydronephrosis -Patient has a history of urethral stricture and dilation in the past.  He used oral doxycycline at home without any relief -Follow urine cultures.  Continue Rocephin. -Renal ultrasound showed bilateral moderate hydronephrosis which has been chronic in nature.  I have spoken to urologist on call on phone who will evaluate the patient in consultation -Continue finasteride  Acute kidney injury on chronic kidney disease stage IV Acute metabolic acidosis -Creatinine 2.18 on presentation.  Improving to 2.59 today.  Switch IV fluids to bicarb drip.  Repeat a.m. labs.  Lisinopril on hold.  Hypertension -Continue amlodipine.  Hold lisinopril  Hyponatremia -Sodium 132 this morning as well.  Monitor  Elevated LFTs Possible cirrhosis of liver -AST and ALT are slightly elevated.  Bilirubin stable.  Ultrasound is suggestive of possible cirrhosis of liver.  Outpatient follow-up with GI  Generalized deconditioning -PT eval   DVT prophylaxis: Heparin Code Status: Spoke with the patient and he is agreeable for DNR Family Communication: None at bedside Disposition Plan: Status is: Observation  The patient will require care spanning > 2 midnights and should be moved to inpatient because:  Inpatient level of care appropriate due to severity of illness  Dispo: The patient is from: Home              Anticipated d/c is to: Home              Anticipated d/c date is: 1 day              Patient currently is not medically stable to d/c.   Consultants: Urology  Procedures: None  Antimicrobials: Rocephin since 11/15/2019 onwards   Subjective: Patient seen and examined at bedside.  Slightly hard of hearing.  States that he had a lousy night because he could not sleep.  Burning urination is improving.  Objective: Vitals:   11/16/19 0025 11/16/19 0131 11/16/19 0412 11/16/19 0924  BP: (!) 144/70  (!) 144/69 (!) 146/72  Pulse: (!) 113  (!) 112 (!) 110  Resp: (!) 24  18 (!) 21  Temp: 98.4 F (36.9 C)  97.8 F (36.6 C) (!) 97.3 F (36.3 C)  TempSrc: Oral  Oral Oral  SpO2: 97% 98% 96% 96%  Weight:      Height:        Intake/Output Summary (Last 24 hours) at 11/16/2019 1117 Last data filed at 11/16/2019 0920 Gross per 24 hour  Intake 1144.71 ml  Output 475 ml  Net 669.71 ml   Filed Weights   11/15/19 1132  Weight: 74.8 kg    Examination:  General exam: Appears calm and comfortable.  No distress.  Elderly male lying in bed.  Slightly hard of hearing. Respiratory system: Bilateral decreased breath sounds at bases with some scattered crackles Cardiovascular system: S1 & S2 heard, tachycardic  Gastrointestinal system: Abdomen is nondistended, soft and nontender. Normal bowel sounds heard. Extremities: No cyanosis, clubbing; trace lower extremity edema Central nervous system: Alert and oriented. No focal neurological deficits. Moving extremities Skin: No rashes, lesions or ulcers Psychiatry: Judgement and insight appear normal. Mood & affect appropriate.     Data Reviewed: I have personally reviewed following labs and imaging studies  CBC: Recent Labs  Lab 11/15/19 1236 11/16/19 0510  WBC 8.9 10.0  HGB 13.2 12.5*  HCT 39.7 37.5*  MCV 96.6 95.2  PLT 203 149    Basic Metabolic Panel: Recent Labs  Lab 11/15/19 1236 11/16/19 0510  NA 132* 132*  K 4.9 4.1  CL 102 104  CO2 20* 15*  GLUCOSE 145* 145*  BUN 41* 38*  CREATININE 2.88* 2.59*  CALCIUM 8.1* 7.8*   GFR: Estimated Creatinine Clearance: 16.1 mL/min (A) (by C-G formula based on SCr of 2.59 mg/dL (H)). Liver Function Tests: Recent Labs  Lab 11/15/19 1236 11/16/19 0510  AST 93* 130*  ALT 105* 144*  ALKPHOS 51 46  BILITOT 0.7 0.8  PROT 7.0 6.5  ALBUMIN 3.5 3.1*   Recent Labs  Lab 11/15/19 1236  LIPASE 26   No results for input(s): AMMONIA in the last 168 hours. Coagulation Profile: No results for input(s): INR, PROTIME in the last 168 hours. Cardiac Enzymes: No results for input(s): CKTOTAL, CKMB, CKMBINDEX, TROPONINI in the last 168 hours. BNP (last 3 results) No results for input(s): PROBNP in the last 8760 hours. HbA1C: No results for input(s): HGBA1C in the last 72 hours. CBG: No results for input(s): GLUCAP in the last 168 hours. Lipid Profile: No results for input(s): CHOL, HDL, LDLCALC, TRIG, CHOLHDL, LDLDIRECT in the last 72 hours. Thyroid Function Tests: Recent Labs    11/15/19 1235  TSH 1.277   Anemia Panel: No results for input(s): VITAMINB12, FOLATE, FERRITIN, TIBC, IRON, RETICCTPCT in the last 72 hours. Sepsis Labs: Recent Labs  Lab 11/15/19 1236  LATICACIDVEN 1.5    Recent Results (from the past 240 hour(s))  Respiratory Panel by RT PCR (Flu A&B, Covid) - Nasopharyngeal Swab     Status: None   Collection Time: 11/15/19 12:36 PM   Specimen: Nasopharyngeal Swab  Result Value Ref Range Status   SARS Coronavirus 2 by RT PCR NEGATIVE NEGATIVE Final    Comment: (NOTE) SARS-CoV-2 target nucleic acids are NOT DETECTED.  The SARS-CoV-2 RNA is generally detectable in upper respiratoy specimens during the acute phase of infection. The lowest concentration of SARS-CoV-2 viral copies this assay can detect is 131 copies/mL. A negative result does  not preclude SARS-Cov-2 infection and should not be used as the sole basis for treatment or other patient management decisions. A negative result may occur with  improper specimen collection/handling, submission of specimen other than nasopharyngeal swab, presence of viral mutation(s) within the areas targeted by this assay, and inadequate number of viral copies (<131 copies/mL). A negative result must be combined with clinical observations, patient history, and epidemiological information. The expected result is Negative.  Fact Sheet for Patients:  PinkCheek.be  Fact Sheet for Healthcare Providers:  GravelBags.it  This test is no t yet approved or cleared by the [place] FDA and  has been authorized for detection and/or diagnosis of SARS-CoV-2 by FDA under an Emergency Use Authorization (EUA). This EUA will remain  in effect (meaning this test can be used) for the duration of the COVID-19 declaration under Section 564(b)(1) of the Act, 21 U.S.C. section 360bbb-3(b)(1), unless  the authorization is terminated or revoked sooner.     Influenza A by PCR NEGATIVE NEGATIVE Final   Influenza B by PCR NEGATIVE NEGATIVE Final    Comment: (NOTE) The Xpert Xpress SARS-CoV-2/FLU/RSV assay is intended as an aid in  the diagnosis of influenza from Nasopharyngeal swab specimens and  should not be used as a sole basis for treatment. Nasal washings and  aspirates are unacceptable for Xpert Xpress SARS-CoV-2/FLU/RSV  testing.  Fact Sheet for Patients: PinkCheek.be  Fact Sheet for Healthcare Providers: GravelBags.it  This test is not yet approved or cleared by the [place] FDA and  has been authorized for detection and/or diagnosis of SARS-CoV-2 by  FDA under an Emergency Use Authorization (EUA). This EUA will remain  in effect (meaning this test can be used) for the  duration of the  Covid-19 declaration under Section 564(b)(1) of the Act, 21  U.S.C. section 360bbb-3(b)(1), unless the authorization is  terminated or revoked. Performed at [facility], [place] [address] [place], [place] [address]          Radiology Studies: US Abdomen Complete  Result Date: 11/15/2019 CLINICAL DATA:  Elevated LFTs EXAM: ABDOMEN ULTRASOUND COMPLETE COMPARISON:  04/02/2015 FINDINGS: Gallbladder: No gallstones or wall thickening visualized. No sonographic Murphy sign noted by sonographer. Common bile duct: Diameter: Normal caliber, 5 mm Liver: Suggestion of slightly nodular liver contours raises the possibility of early cirrhosis. Recommend clinical correlation. No focal mass. Normal echotexture. Portal vein is patent on color Doppler imaging with normal direction of blood flow towards the liver. IVC: No abnormality visualized. Pancreas: Visualized portion unremarkable. Spleen: Size and appearance within normal limits. Right Kidney: Length: 11.1 cm. 6 cm cyst in the upper pole. Moderate right hydronephrosis. Increased echotexture throughout the right kidney. Left Kidney: Length: 9.6 cm. Moderate left hydronephrosis. Increased echotexture throughout the left kidney. No mass. Abdominal aorta: No aneurysm visualized. Other findings: Bladder wall thickening and irregularity. IMPRESSION: Bilateral moderate hydronephrosis. Bladder wall thickening and irregularity. Slightly nodular contour of the liver surface suggests the possibility of early cirrhosis. Recommend clinical correlation. No focal abnormality. Electronically Signed   By: Rolm Baptise M.D.   On: 11/15/2019 21:10        Scheduled Meds: . amLODipine  2.5 mg Oral BID  . finasteride  5 mg Oral QPM  . heparin  5,000 Units Subcutaneous Q8H   Continuous Infusions: . sodium chloride 75 mL/hr at 11/16/19 0821  . cefTRIAXone (ROCEPHIN)  IV 1 g (11/16/19 1020)          Aline August, MD [facility] 11/16/2019, 11:17 AM

## 2019-11-16 NOTE — Progress Notes (Signed)
Pt. Complained of nausea last PRN Zofran p.o given at 1425hr, MD was notified OK to give IV Zofran. Will monitor pt. Closely.

## 2019-11-16 NOTE — Consult Note (Signed)
I have been asked to see the patient by Dr. Aline August, for evaluation and management of bilateral hydronephrosis and UTI.  History of present illness: 86 yo man with hx of urethral stricture, chronic epididymitis and bilateral hydronephrosis currently admitted for UTI, acute on chronic kidney injury and bilateral hydronephrosis.  He last saw Dr. Matilde Sprang in June 2021.  He was using doxycycline PRN dysuria but the dysuria persisted and he went to ED.  Patient had imaging from a few months ago that showed  bilateral hydronephrosis and he decided on watchful waiting as he was not experiencing pain or obstructive symptoms.  Patient had urethral balloon dilation in 2017.  He states his stream is strong and he empties his bladder well.  No fever or chills.     Review of systems: A 12 point comprehensive review of systems was obtained and is negative unless otherwise stated in the history of present illness.  Patient Active Problem List   Diagnosis Date Noted  . UTI (urinary tract infection) 11/15/2019  . Arthritis of sacroiliac joint of both sides 07/25/2019  . Degenerative disc disease, lumbar 05/08/2019  . Anserine bursitis 12/20/2018  . Gluteal tendinitis of right buttock 10/13/2017  . Right hip pain 09/16/2017  . Right knee sprain 02/27/2015    No current facility-administered medications on file prior to encounter.   Current Outpatient Medications on File Prior to Encounter  Medication Sig Dispense Refill  . acetaminophen (TYLENOL) 500 MG tablet Take 500-1,000 mg by mouth every 6 (six) hours as needed for moderate pain.    Marland Kitchen amLODipine (NORVASC) 2.5 MG tablet Take 2.5 mg by mouth 2 (two) times daily.    . diphenhydramine-acetaminophen (TYLENOL PM) 25-500 MG TABS tablet Take 1 tablet by mouth at bedtime.    . finasteride (PROSCAR) 5 MG tablet Take 5 mg by mouth daily. Evening    . gabapentin (NEURONTIN) 100 MG capsule TAKE TWO CAPSULES BY MOUTH AT BEDTIME 180 capsule 0  . lisinopril  (PRINIVIL,ZESTRIL) 20 MG tablet Take 20 mg by mouth daily.     . Multiple Vitamins-Minerals (PRESERVISION AREDS 2+MULTI VIT PO) Take 2 tablets by mouth daily.    . simvastatin (ZOCOR) 20 MG tablet Take 20 mg by mouth daily.      Past Medical History:  Diagnosis Date  . Arthritis   . Cancer [facility])    Colon  . Hyperlipidemia   . Hypertension   . Melanoma [facility])     Past Surgical History:  Procedure Laterality Date  . BALLOON DILATION N/A 04/02/2015   Procedure: BALLOON DILATION;  Surgeon: Bjorn Loser, MD;  Location: [facility];  Service: Urology;  Laterality: N/A;  . CATARACT EXTRACTION    . COLON SURGERY     1981 - colon cancer  . CYSTOSCOPY WITH RETROGRADE PYELOGRAM, URETEROSCOPY AND STENT PLACEMENT N/A 04/02/2015   Procedure: CYSTO WITH RETROGRADE ;  Surgeon: Bjorn Loser, MD;  Location: [facility];  Service: Urology;  Laterality: N/A;  . Left thub surgery    . TONSILLECTOMY    . URETHRAL STRICTURE DILATATION      Social History   Tobacco Use  . Smoking status: Former Smoker    Quit date: 01/31/1977    Years since quitting: 42.8  . Smokeless tobacco: Never Used  Vaping Use  . Vaping Use: Never used  Substance Use Topics  . Alcohol use: Yes    Alcohol/week: 1.0 standard drink    Types: 1 Glasses of wine per week  . Drug use:  No    History reviewed. No pertinent family history.  PE: Vitals:   11/15/19 2210 11/16/19 0025 11/16/19 0131 11/16/19 0412  BP: (!) 144/76 (!) 144/70  (!) 144/69  Pulse: (!) 106 (!) 113  (!) 112  Resp: 20 (!) 24  18  Temp: 98.3 F (36.8 C) 98.4 F (36.9 C)  97.8 F (36.6 C)  TempSrc: Oral Oral  Oral  SpO2: 96% 97% 98% 96%  Weight:      Height:       Patient appears to be in no acute distress  patient is alert and oriented x3 Atraumatic normocephalic head No cervical or supraclavicular lymphadenopathy appreciated Mild audible wheezing Regular sinus rhythm/rate Abdomen is soft, nontender, nondistended, no CVA or suprapubic  tenderness GU phallus retracted Lower extremities are symmetric without appreciable edema Grossly neurologically intact No identifiable skin lesions  Recent Labs    11/15/19 1236 11/16/19 0510  WBC 8.9 10.0  HGB 13.2 12.5*  HCT 39.7 37.5*   Recent Labs    11/15/19 1236 11/16/19 0510  NA 132* 132*  K 4.9 4.1  CL 102 104  CO2 20* 15*  GLUCOSE 145* 145*  BUN 41* 38*  CREATININE 2.88* 2.59*  CALCIUM 8.1* 7.8*   No results for input(s): LABPT, INR in the last 72 hours. No results for input(s): LABURIN in the last 72 hours. Results for orders placed or performed during the hospital encounter of 11/15/19  Respiratory Panel by RT PCR (Flu A&B, Covid) - Nasopharyngeal Swab     Status: None   Collection Time: 11/15/19 12:36 PM   Specimen: Nasopharyngeal Swab  Result Value Ref Range Status   SARS Coronavirus 2 by RT PCR NEGATIVE NEGATIVE Final    Comment: (NOTE) SARS-CoV-2 target nucleic acids are NOT DETECTED.  The SARS-CoV-2 RNA is generally detectable in upper respiratoy specimens during the acute phase of infection. The lowest concentration of SARS-CoV-2 viral copies this assay can detect is 131 copies/mL. A negative result does not preclude SARS-Cov-2 infection and should not be used as the sole basis for treatment or other patient management decisions. A negative result may occur with  improper specimen collection/handling, submission of specimen other than nasopharyngeal swab, presence of viral mutation(s) within the areas targeted by this assay, and inadequate number of viral copies (<131 copies/mL). A negative result must be combined with clinical observations, patient history, and epidemiological information. The expected result is Negative.  Fact Sheet for Patients:  PinkCheek.be  Fact Sheet for Healthcare Providers:  GravelBags.it  This test is no t yet approved or cleared by the [place] FDA and   has been authorized for detection and/or diagnosis of SARS-CoV-2 by FDA under an Emergency Use Authorization (EUA). This EUA will remain  in effect (meaning this test can be used) for the duration of the COVID-19 declaration under Section 564(b)(1) of the Act, 21 U.S.C. section 360bbb-3(b)(1), unless the authorization is terminated or revoked sooner.     Influenza A by PCR NEGATIVE NEGATIVE Final   Influenza B by PCR NEGATIVE NEGATIVE Final    Comment: (NOTE) The Xpert Xpress SARS-CoV-2/FLU/RSV assay is intended as an aid in  the diagnosis of influenza from Nasopharyngeal swab specimens and  should not be used as a sole basis for treatment. Nasal washings and  aspirates are unacceptable for Xpert Xpress SARS-CoV-2/FLU/RSV  testing.  Fact Sheet for Patients: PinkCheek.be  Fact Sheet for Healthcare Providers: GravelBags.it  This test is not yet approved or cleared by the [place]  FDA and  has been authorized for detection and/or diagnosis of SARS-CoV-2 by  FDA under an Emergency Use Authorization (EUA). This EUA will remain  in effect (meaning this test can be used) for the duration of the  Covid-19 declaration under Section 564(b)(1) of the Act, 21  U.S.C. section 360bbb-3(b)(1), unless the authorization is  terminated or revoked. Performed at [facility], [place] [address], [place], [place] [address]     Imaging: Renal US 11/15/19 IMPRESSION: Bilateral moderate hydronephrosis. Bladder wall thickening and irregularity.   Slightly nodular contour of the liver surface suggests the possibility of early cirrhosis. Recommend clinical correlation. No focal abnormality.      Imp: 86 yo man with hx of urethral stricture, CKD and b/l hydronephrosis currently admitted with UTI, acute on chronic kidney injury with continued findings of hydronephrosis.   Recommendations: 1. B/l hydronephrosis:   This  appears long standing and patient has not been symptomatic previously.  Recommend obtaining bladder scan to see what PVR is.  We discussed continued observation, suprapubic tube that can be done via IR, and foley that may require OR intervention due to stricture.  Patient wishes to avoid foley.  He would like to discuss longterm bladder management with Dr. Matilde Sprang as an outpatient.   2. Acute on chronic kidney injury: Cr appears to be coming down with fluids.  3. UTI: Treated according to culture.    Thank you for involving me in this patient's care.  Please page with any further questions or concerns. Zerick Prevette D Edenilson Austad

## 2019-11-16 NOTE — Progress Notes (Addendum)
Patient was asked regarding DNR status, patient signified Full code status, Education and reinforcement provided. MD was notified.

## 2019-11-17 DIAGNOSIS — N179 Acute kidney failure, unspecified: Secondary | ICD-10-CM

## 2019-11-17 DIAGNOSIS — N189 Chronic kidney disease, unspecified: Secondary | ICD-10-CM

## 2019-11-17 LAB — CBC WITH DIFFERENTIAL/PLATELET
Abs Immature Granulocytes: 0.07 10*3/uL (ref 0.00–0.07)
Basophils Absolute: 0.1 10*3/uL (ref 0.0–0.1)
Basophils Relative: 0 %
Eosinophils Absolute: 0 10*3/uL (ref 0.0–0.5)
Eosinophils Relative: 0 %
HCT: 37.2 % — ABNORMAL LOW (ref 39.0–52.0)
Hemoglobin: 12.5 g/dL — ABNORMAL LOW (ref 13.0–17.0)
Immature Granulocytes: 1 %
Lymphocytes Relative: 11 %
Lymphs Abs: 1.2 10*3/uL (ref 0.7–4.0)
MCH: 31.7 pg (ref 26.0–34.0)
MCHC: 33.6 g/dL (ref 30.0–36.0)
MCV: 94.4 fL (ref 80.0–100.0)
Monocytes Absolute: 1.3 10*3/uL — ABNORMAL HIGH (ref 0.1–1.0)
Monocytes Relative: 11 %
Neutro Abs: 8.8 10*3/uL — ABNORMAL HIGH (ref 1.7–7.7)
Neutrophils Relative %: 77 %
Platelets: 203 10*3/uL (ref 150–400)
RBC: 3.94 MIL/uL — ABNORMAL LOW (ref 4.22–5.81)
RDW: 13.1 % (ref 11.5–15.5)
WBC: 11.4 10*3/uL — ABNORMAL HIGH (ref 4.0–10.5)
nRBC: 0 % (ref 0.0–0.2)

## 2019-11-17 LAB — URINE CULTURE: Culture: >=100000 — AB

## 2019-11-17 LAB — COMPREHENSIVE METABOLIC PANEL
ALT: 319 U/L — ABNORMAL HIGH (ref 0–44)
AST: 255 U/L — ABNORMAL HIGH (ref 15–41)
Albumin: 2.8 g/dL — ABNORMAL LOW (ref 3.5–5.0)
Alkaline Phosphatase: 50 U/L (ref 38–126)
Anion gap: 9 (ref 5–15)
BUN: 33 mg/dL — ABNORMAL HIGH (ref 8–23)
CO2: 20 mmol/L — ABNORMAL LOW (ref 22–32)
Calcium: 8 mg/dL — ABNORMAL LOW (ref 8.9–10.3)
Chloride: 104 mmol/L (ref 98–111)
Creatinine, Ser: 2.12 mg/dL — ABNORMAL HIGH (ref 0.61–1.24)
GFR, Estimated: 28 mL/min — ABNORMAL LOW (ref >60)
Glucose, Bld: 146 mg/dL — ABNORMAL HIGH (ref 70–99)
Potassium: 3.8 mmol/L (ref 3.5–5.1)
Sodium: 133 mmol/L — ABNORMAL LOW (ref 135–145)
Total Bilirubin: 0.4 mg/dL (ref 0.3–1.2)
Total Protein: 6.1 g/dL — ABNORMAL LOW (ref 6.5–8.1)

## 2019-11-17 LAB — MAGNESIUM: Magnesium: 2 mg/dL (ref 1.7–2.4)

## 2019-11-17 MED ORDER — CEPHALEXIN 500 MG PO CAPS: 500.0000 mg | ORAL_CAPSULE | Freq: Two times a day (BID) | ORAL | 0 refills | Status: AC

## 2019-11-17 NOTE — Discharge Summary (Signed)
Physician Discharge Summary  Jesus Kim OXB:[id number] DOB: 05/08/1924 DOA: 11/15/2019  PCP: [name], MD  Admit date: 11/15/2019 Discharge date: 11/17/2019  Admitted From: Home Disposition: Home  Recommendations for Outpatient Follow-up:  1. Follow up with PCP in 1 week with repeat CBC/CMP 2. Outpatient follow-up by urology 3. Outpatient evaluation and follow-up by GI for possible diagnosis of cirrhosis of liver 4. Follow up in ED if symptoms worsen or new appear   Home Health: No Equipment/Devices: None  Discharge Condition: Guarded CODE STATUS: DNR Diet recommendation: Heart healthy  Brief/Interim Summary:   Discharge Diagnoses:   Complicated UTI in a patient with history of urethral stricture and recurrent UTIs Bilateral moderate hydronephrosis -Patient has a history of urethral stricture and dilation in the past.  He used oral doxycycline at home without any relief -Currently on Rocephin -Renal ultrasound showed bilateral moderate hydronephrosis which has been chronic in nature.   -Urology consultation appreciated: Urology recommends outpatient follow-up with urology; patient wishes to avoid Foley catheter at this time -Continue finasteride -Urine culture growing Proteus.  Patient feels much better and really wants to go home.  will discharge him today on renally dosed Keflex for 7 more days.  Acute kidney injury on chronic kidney disease stage IV Acute metabolic acidosis -Creatinine 2.88 on presentation.    Treated with IV fluids and subsequently switched to bicarb drip.  Creatinine 2.12 today.  Acidosis is improving.  DC bicarb drip. -Continue to hold lisinopril.  Outpatient follow-up of BMP with PCP  Hypertension -Continue amlodipine.  Hold lisinopril  Hyponatremia -Sodium 133 this morning as well.  Outpatient follow-up  Elevated LFTs Possible cirrhosis of liver -AST and ALT are still elevated.  Bilirubin stable.  Ultrasound is suggestive of possible  cirrhosis of liver.    Denies any abdominal pain or vomiting.  Recommend outpatient evaluation and follow-up by GI.  Will need repeat CMP as an outpatient.  Hold statin  Generalized deconditioning -PT eval is pending   Discharge Instructions  Discharge Instructions    Ambulatory referral to Gastroenterology   Complete by: As directed    New diagnosis of possible cirrhosis of liver   Diet - low sodium heart healthy   Complete by: As directed    Increase activity slowly   Complete by: As directed      Allergies as of 11/17/2019      Reactions   Sulfa Antibiotics Rash      Medication List    STOP taking these medications   lisinopril 20 MG tablet Commonly known as: ZESTRIL   simvastatin 20 MG tablet Commonly known as: ZOCOR     TAKE these medications   acetaminophen 500 MG tablet Commonly known as: TYLENOL Take 500-1,000 mg by mouth every 6 (six) hours as needed for moderate pain.   amLODipine 2.5 MG tablet Commonly known as: NORVASC Take 2.5 mg by mouth 2 (two) times daily.   cephALEXin 500 MG capsule Commonly known as: KEFLEX Take 1 capsule (500 mg total) by mouth 2 (two) times daily for 7 days.   diphenhydramine-acetaminophen 25-500 MG Tabs tablet Commonly known as: TYLENOL PM Take 1 tablet by mouth at bedtime.   finasteride 5 MG tablet Commonly known as: PROSCAR Take 5 mg by mouth daily. Evening   gabapentin 100 MG capsule Commonly known as: NEURONTIN TAKE TWO CAPSULES BY MOUTH AT BEDTIME   PRESERVISION AREDS 2+MULTI VIT PO Take 2 tablets by mouth daily.       Follow-up Information  [name], MD. Schedule an appointment as soon as possible for a visit in 1 week(s).   Specialty: Family Medicine Why: With repeat CBC/CMP Contact information: [facility] [place] [id number] [phone]        Bjorn Loser, MD. Schedule an appointment as soon as possible for a visit in 1 week(s).   Specialty: Urology Contact  information: [address] [place] [address] [phone]              Allergies  Allergen Reactions  . Sulfa Antibiotics Rash    Consultations:  Urology   Procedures/Studies: US Abdomen Complete  Result Date: 11/15/2019 CLINICAL DATA:  Elevated LFTs EXAM: ABDOMEN ULTRASOUND COMPLETE COMPARISON:  04/02/2015 FINDINGS: Gallbladder: No gallstones or wall thickening visualized. No sonographic Murphy sign noted by sonographer. Common bile duct: Diameter: Normal caliber, 5 mm Liver: Suggestion of slightly nodular liver contours raises the possibility of early cirrhosis. Recommend clinical correlation. No focal mass. Normal echotexture. Portal vein is patent on color Doppler imaging with normal direction of blood flow towards the liver. IVC: No abnormality visualized. Pancreas: Visualized portion unremarkable. Spleen: Size and appearance within normal limits. Right Kidney: Length: 11.1 cm. 6 cm cyst in the upper pole. Moderate right hydronephrosis. Increased echotexture throughout the right kidney. Left Kidney: Length: 9.6 cm. Moderate left hydronephrosis. Increased echotexture throughout the left kidney. No mass. Abdominal aorta: No aneurysm visualized. Other findings: Bladder wall thickening and irregularity. IMPRESSION: Bilateral moderate hydronephrosis. Bladder wall thickening and irregularity. Slightly nodular contour of the liver surface suggests the possibility of early cirrhosis. Recommend clinical correlation. No focal abnormality. Electronically Signed   By: Rolm Baptise M.D.   On: 11/15/2019 21:10       Subjective: Patient seen and examined at bedside.  He feels much better and really wants to go home today.  Does not want to stay an extra day.  No overnight fever, vomiting, worsening shortness of breath reported.  Burning urination has much improved.  Discharge Exam: Vitals:   11/17/19 0156 11/17/19 0511  BP: (!) 153/76 (!) 157/75  Pulse: (!) 106 (!) 108  Resp: 20    Temp: 97.8 F (36.6 C) 98.3 F (36.8 C)  SpO2: 98% 97%    General: Pt is alert, awake, not in acute distress.  Elderly male lying in bed.  Slightly hard of hearing. Cardiovascular: Slightly tachycardic; S1/S2 + Respiratory: bilateral decreased breath sounds at bases with some scattered crackles Abdominal: Soft, NT, ND, bowel sounds + Extremities: Trace lower extremity edema, no cyanosis    The results of significant diagnostics from this hospitalization (including imaging, microbiology, ancillary and laboratory) are listed below for reference.     Microbiology: Recent Results (from the past 240 hour(s))  Urine culture     Status: Abnormal   Collection Time: 11/15/19 11:37 AM   Specimen: Urine, Clean Catch  Result Value Ref Range Status   Specimen Description   Final    URINE, CLEAN CATCH Performed at [facility], [place] [address] [place], [place] [address]    Special Requests   Final    NONE Performed at [facility], [place] [address] [place], [place] [address]    Culture >=100,000 COLONIES/mL PROTEUS MIRABILIS (A)  Final   Report Status 11/17/2019 FINAL  Final   Organism ID, Bacteria PROTEUS MIRABILIS (A)  Final      Susceptibility   Proteus mirabilis - MIC*    AMPICILLIN <=2 SENSITIVE Sensitive  CEFAZOLIN <=4 SENSITIVE Sensitive     CEFEPIME <=0.12 SENSITIVE Sensitive     CEFTRIAXONE <=0.25 SENSITIVE Sensitive     CIPROFLOXACIN <=0.25 SENSITIVE Sensitive     GENTAMICIN <=1 SENSITIVE Sensitive     IMIPENEM 2 SENSITIVE Sensitive     NITROFURANTOIN 128 RESISTANT Resistant     TRIMETH/SULFA <=20 SENSITIVE Sensitive     AMPICILLIN/SULBACTAM <=2 SENSITIVE Sensitive     PIP/TAZO <=4 SENSITIVE Sensitive     * >=100,000 COLONIES/mL PROTEUS MIRABILIS  Respiratory Panel by RT PCR (Flu A&B, Covid) - Nasopharyngeal Swab     Status: None   Collection Time: 11/15/19 12:36 PM   Specimen: Nasopharyngeal Swab  Result Value Ref Range Status    SARS Coronavirus 2 by RT PCR NEGATIVE NEGATIVE Final    Comment: (NOTE) SARS-CoV-2 target nucleic acids are NOT DETECTED.  The SARS-CoV-2 RNA is generally detectable in upper respiratoy specimens during the acute phase of infection. The lowest concentration of SARS-CoV-2 viral copies this assay can detect is 131 copies/mL. A negative result does not preclude SARS-Cov-2 infection and should not be used as the sole basis for treatment or other patient management decisions. A negative result may occur with  improper specimen collection/handling, submission of specimen other than nasopharyngeal swab, presence of viral mutation(s) within the areas targeted by this assay, and inadequate number of viral copies (<131 copies/mL). A negative result must be combined with clinical observations, patient history, and epidemiological information. The expected result is Negative.  Fact Sheet for Patients:  PinkCheek.be  Fact Sheet for Healthcare Providers:  GravelBags.it  This test is no t yet approved or cleared by the [place] FDA and  has been authorized for detection and/or diagnosis of SARS-CoV-2 by FDA under an Emergency Use Authorization (EUA). This EUA will remain  in effect (meaning this test can be used) for the duration of the COVID-19 declaration under Section 564(b)(1) of the Act, 21 U.S.C. section 360bbb-3(b)(1), unless the authorization is terminated or revoked sooner.     Influenza A by PCR NEGATIVE NEGATIVE Final   Influenza B by PCR NEGATIVE NEGATIVE Final    Comment: (NOTE) The Xpert Xpress SARS-CoV-2/FLU/RSV assay is intended as an aid in  the diagnosis of influenza from Nasopharyngeal swab specimens and  should not be used as a sole basis for treatment. Nasal washings and  aspirates are unacceptable for Xpert Xpress SARS-CoV-2/FLU/RSV  testing.  Fact Sheet for  Patients: PinkCheek.be  Fact Sheet for Healthcare Providers: GravelBags.it  This test is not yet approved or cleared by the [place] FDA and  has been authorized for detection and/or diagnosis of SARS-CoV-2 by  FDA under an Emergency Use Authorization (EUA). This EUA will remain  in effect (meaning this test can be used) for the duration of the  Covid-19 declaration under Section 564(b)(1) of the Act, 21  U.S.C. section 360bbb-3(b)(1), unless the authorization is  terminated or revoked. Performed at [facility], [place] [address], [place], [place] [address]      Labs: BNP (last 3 results) No results for input(s): BNP in the last 8760 hours. Basic Metabolic Panel: Recent Labs  Lab 11/15/19 1236 11/16/19 0510 11/17/19 0509  NA 132* 132* 133*  K 4.9 4.1 3.8  CL 102 104 104  CO2 20* 15* 20*  GLUCOSE 145* 145* 146*  BUN 41* 38* 33*  CREATININE 2.88* 2.59* 2.12*  CALCIUM 8.1* 7.8* 8.0*  MG  --   --  2.0   Liver Function Tests:  Recent Labs  Lab 11/15/19 1236 11/16/19 0510 11/17/19 0509  AST 93* 130* 255*  ALT 105* 144* 319*  ALKPHOS 51 46 50  BILITOT 0.7 0.8 0.4  PROT 7.0 6.5 6.1*  ALBUMIN 3.5 3.1* 2.8*   Recent Labs  Lab 11/15/19 1236  LIPASE 26   No results for input(s): AMMONIA in the last 168 hours. CBC: Recent Labs  Lab 11/15/19 1236 11/16/19 0510 11/17/19 0509  WBC 8.9 10.0 11.4*  NEUTROABS  --   --  8.8*  HGB 13.2 12.5* 12.5*  HCT 39.7 37.5* 37.2*  MCV 96.6 95.2 94.4  PLT 203 211 203   Cardiac Enzymes: No results for input(s): CKTOTAL, CKMB, CKMBINDEX, TROPONINI in the last 168 hours. BNP: Invalid input(s): POCBNP CBG: No results for input(s): GLUCAP in the last 168 hours. D-Dimer No results for input(s): DDIMER in the last 72 hours. Hgb A1c No results for input(s): HGBA1C in the last 72 hours. Lipid Profile No results for input(s): CHOL, HDL, LDLCALC, TRIG,  CHOLHDL, LDLDIRECT in the last 72 hours. Thyroid function studies Recent Labs    11/15/19 1235  TSH 1.277   Anemia work up No results for input(s): VITAMINB12, FOLATE, FERRITIN, TIBC, IRON, RETICCTPCT in the last 72 hours. Urinalysis    Component Value Date/Time   COLORURINE YELLOW 11/15/2019 1137   APPEARANCEUR CLOUDY (A) 11/15/2019 1137   LABSPEC 1.011 11/15/2019 1137   PHURINE 8.0 11/15/2019 1137   GLUCOSEU NEGATIVE 11/15/2019 1137   HGBUR SMALL (A) 11/15/2019 1137   BILIRUBINUR NEGATIVE 11/15/2019 1137   KETONESUR 5 (A) 11/15/2019 1137   PROTEINUR 100 (A) 11/15/2019 1137   UROBILINOGEN 1.0 01/02/2008 1155   NITRITE NEGATIVE 11/15/2019 1137   LEUKOCYTESUR LARGE (A) 11/15/2019 1137   Sepsis Labs Invalid input(s): PROCALCITONIN,  WBC,  LACTICIDVEN Microbiology Recent Results (from the past 240 hour(s))  Urine culture     Status: Abnormal   Collection Time: 11/15/19 11:37 AM   Specimen: Urine, Clean Catch  Result Value Ref Range Status   Specimen Description   Final    URINE, CLEAN CATCH Performed at [facility], [place] [address] [place], [place] [address]    Special Requests   Final    NONE Performed at [facility], [place] [address] [place], [place] [address]    Culture >=100,000 COLONIES/mL PROTEUS MIRABILIS (A)  Final   Report Status 11/17/2019 FINAL  Final   Organism ID, Bacteria PROTEUS MIRABILIS (A)  Final      Susceptibility   Proteus mirabilis - MIC*    AMPICILLIN <=2 SENSITIVE Sensitive     CEFAZOLIN <=4 SENSITIVE Sensitive     CEFEPIME <=0.12 SENSITIVE Sensitive     CEFTRIAXONE <=0.25 SENSITIVE Sensitive     CIPROFLOXACIN <=0.25 SENSITIVE Sensitive     GENTAMICIN <=1 SENSITIVE Sensitive     IMIPENEM 2 SENSITIVE Sensitive     NITROFURANTOIN 128 RESISTANT Resistant     TRIMETH/SULFA <=20 SENSITIVE Sensitive     AMPICILLIN/SULBACTAM <=2 SENSITIVE Sensitive     PIP/TAZO <=4 SENSITIVE Sensitive     * >=100,000  COLONIES/mL PROTEUS MIRABILIS  Respiratory Panel by RT PCR (Flu A&B, Covid) - Nasopharyngeal Swab     Status: None   Collection Time: 11/15/19 12:36 PM   Specimen: Nasopharyngeal Swab  Result Value Ref Range Status   SARS Coronavirus 2 by RT PCR NEGATIVE NEGATIVE Final    Comment: (NOTE) SARS-CoV-2 target nucleic acids are NOT DETECTED.  The SARS-CoV-2 RNA is generally detectable  in upper respiratoy specimens during the acute phase of infection. The lowest concentration of SARS-CoV-2 viral copies this assay can detect is 131 copies/mL. A negative result does not preclude SARS-Cov-2 infection and should not be used as the sole basis for treatment or other patient management decisions. A negative result may occur with  improper specimen collection/handling, submission of specimen other than nasopharyngeal swab, presence of viral mutation(s) within the areas targeted by this assay, and inadequate number of viral copies (<131 copies/mL). A negative result must be combined with clinical observations, patient history, and epidemiological information. The expected result is Negative.  Fact Sheet for Patients:  PinkCheek.be  Fact Sheet for Healthcare Providers:  GravelBags.it  This test is no t yet approved or cleared by the [place] FDA and  has been authorized for detection and/or diagnosis of SARS-CoV-2 by FDA under an Emergency Use Authorization (EUA). This EUA will remain  in effect (meaning this test can be used) for the duration of the COVID-19 declaration under Section 564(b)(1) of the Act, 21 U.S.C. section 360bbb-3(b)(1), unless the authorization is terminated or revoked sooner.     Influenza A by PCR NEGATIVE NEGATIVE Final   Influenza B by PCR NEGATIVE NEGATIVE Final    Comment: (NOTE) The Xpert Xpress SARS-CoV-2/FLU/RSV assay is intended as an aid in  the diagnosis of influenza from Nasopharyngeal swab  specimens and  should not be used as a sole basis for treatment. Nasal washings and  aspirates are unacceptable for Xpert Xpress SARS-CoV-2/FLU/RSV  testing.  Fact Sheet for Patients: PinkCheek.be  Fact Sheet for Healthcare Providers: GravelBags.it  This test is not yet approved or cleared by the [place] FDA and  has been authorized for detection and/or diagnosis of SARS-CoV-2 by  FDA under an Emergency Use Authorization (EUA). This EUA will remain  in effect (meaning this test can be used) for the duration of the  Covid-19 declaration under Section 564(b)(1) of the Act, 21  U.S.C. section 360bbb-3(b)(1), unless the authorization is  terminated or revoked. Performed at [facility], [place] [address] [place], [place] [address]      Time coordinating discharge: 35 minutes  SIGNED:   Aline August, MD  [facility] 11/17/2019, 11:34 AM

## 2019-11-17 NOTE — Evaluation (Signed)
Physical Therapy One Time Evaluation Patient Details Name: Jesus Kim MRN: [id number] DOB: [birth year] Today's Date: 11/17/2019   History of Present Illness  86 year old man with hx of urethral stricture, chronic epididymitis and bilateral hydronephrosis currently admitted for UTI, acute on chronic kidney injury and bilateral hydronephrosis.  Clinical Impression  Patient evaluated by Physical Therapy with no further acute PT needs identified. All education has been completed and the patient has no further questions.  Pt ambulated in hallway and feels at baseline in regards to mobility.  Pt denies history of falls and states he was working with OPPT prior to this admission. See below for any follow-up Physical Therapy or equipment needs. PT is signing off. Thank you for this referral.     Follow Up Recommendations Outpatient PT (resume OPPT)    Equipment Recommendations  None recommended by PT    Recommendations for Other Services       Precautions / Restrictions Precautions Precautions: Fall Restrictions Weight Bearing Restrictions: No      Mobility  Bed Mobility Overal bed mobility: Modified Independent                  Transfers Overall transfer level: Modified independent                  Ambulation/Gait Ambulation/Gait assistance: Supervision;Min guard Gait Distance (Feet): 400 Feet Assistive device: None Gait Pattern/deviations: Step-through pattern;Decreased stride length;Wide base of support     General Gait Details: pt reports feeling at baseline, grazing hand rail occasionally however reports he was using his "walking stick" for a little more support just prior to admission  Stairs            Wheelchair Mobility    Modified Rankin (Stroke Patients Only)       Balance Overall balance assessment: Mild deficits observed, not formally tested (denies any recent falls)                                            Pertinent Vitals/Pain Pain Assessment: No/denies pain    Home Living Family/patient expects to be discharged to:: Private residence Living Arrangements: Spouse/significant other Available Help at Discharge: Family   Home Access: Level entry     Home Layout: One level Home Equipment: None Additional Comments: reports he uses walking stick when needing a little more support    Prior Function Level of Independence: Independent               Hand Dominance        Extremity/Trunk Assessment                Communication   Communication: No difficulties  Cognition Arousal/Alertness: Awake/alert Behavior During Therapy: WFL for tasks assessed/performed Overall Cognitive Status: Within Functional Limits for tasks assessed                                        General Comments      Exercises     Assessment/Plan    PT Assessment Patent does not need any further PT services  PT Problem List         PT Treatment Interventions      PT Goals (Current goals can be found in the Care Plan section)  Acute  Rehab PT Goals PT Goal Formulation: All assessment and education complete, DC therapy    Frequency     Barriers to discharge        Co-evaluation               AM-PAC PT "6 Clicks" Mobility  Outcome Measure Help needed turning from your back to your side while in a flat bed without using bedrails?: None Help needed moving from lying on your back to sitting on the side of a flat bed without using bedrails?: None Help needed moving to and from a bed to a chair (including a wheelchair)?: None Help needed standing up from a chair using your arms (e.g., wheelchair or bedside chair)?: A Little Help needed to walk in hospital room?: A Little Help needed climbing 3-5 steps with a railing? : A Little 6 Click Score: 21    End of Session Equipment Utilized During Treatment: Gait belt Activity Tolerance: Patient tolerated treatment  well Patient left: in chair;with call bell/phone within reach Nurse Communication: Mobility status      Time: 7092-9574 PT Time Calculation (min) (ACUTE ONLY): 13 min   Charges:   PT Evaluation $PT Eval Low Complexity: 1 Low        Kati PT, DPT Acute Rehabilitation Services Pager: [phone] Office: [phone]  York Ram E 11/17/2019, 11:48 AM

## 2019-11-17 NOTE — Progress Notes (Signed)
Patient discharged home with daughter, discharge instructions given and explained to patient, he verbalized understanding, patient denies any pain or distress, transported to the car by staff, accompanied home by daughter.

## 2019-11-17 NOTE — TOC Transition Note (Signed)
Transition of Care [facility]) - CM/SW Discharge Note   Patient Details  Name: Jesus Kim MRN: [id number] Date of Birth: [birth year]  [facility]) CM/SW Contact:  Dessa Phi, RN Phone Number: 11/17/2019, 12:04 PM   Clinical Narrative:  PT recc otpt PT-referral for continued otpt PT sent. No further CM needs.           Patient Goals and CMS Choice        Discharge Placement                       Discharge Plan and Services                                     Social Determinants of Health (SDOH) Interventions     Readmission Risk Interventions No flowsheet data found.

## 2019-11-20 ENCOUNTER — Ambulatory Visit: Payer: PPO | Admitting: Physical Therapy

## 2019-11-22 DIAGNOSIS — E785 Hyperlipidemia, unspecified: Secondary | ICD-10-CM | POA: Diagnosis not present

## 2019-11-22 DIAGNOSIS — I129 Hypertensive chronic kidney disease with stage 1 through stage 4 chronic kidney disease, or unspecified chronic kidney disease: Secondary | ICD-10-CM | POA: Diagnosis not present

## 2019-11-22 DIAGNOSIS — I10 Essential (primary) hypertension: Secondary | ICD-10-CM | POA: Diagnosis not present

## 2019-11-22 DIAGNOSIS — N179 Acute kidney failure, unspecified: Secondary | ICD-10-CM | POA: Diagnosis not present

## 2019-11-22 DIAGNOSIS — R945 Abnormal results of liver function studies: Secondary | ICD-10-CM | POA: Diagnosis not present

## 2019-11-22 DIAGNOSIS — Z09 Encounter for follow-up examination after completed treatment for conditions other than malignant neoplasm: Secondary | ICD-10-CM | POA: Diagnosis not present

## 2019-11-22 DIAGNOSIS — N184 Chronic kidney disease, stage 4 (severe): Secondary | ICD-10-CM | POA: Diagnosis not present

## 2019-11-22 DIAGNOSIS — N390 Urinary tract infection, site not specified: Secondary | ICD-10-CM | POA: Diagnosis not present

## 2019-11-28 ENCOUNTER — Encounter: Payer: PPO | Admitting: Physical Therapy

## 2019-11-28 DIAGNOSIS — R351 Nocturia: Secondary | ICD-10-CM | POA: Diagnosis not present

## 2019-11-28 DIAGNOSIS — R350 Frequency of micturition: Secondary | ICD-10-CM | POA: Diagnosis not present

## 2019-11-28 DIAGNOSIS — R3914 Feeling of incomplete bladder emptying: Secondary | ICD-10-CM | POA: Diagnosis not present

## 2019-11-29 ENCOUNTER — Encounter: Payer: PPO | Admitting: Physical Therapy

## 2019-12-04 ENCOUNTER — Ambulatory Visit: Payer: PPO | Attending: Family Medicine | Admitting: Physical Therapy

## 2019-12-04 ENCOUNTER — Other Ambulatory Visit: Payer: Self-pay

## 2019-12-04 ENCOUNTER — Encounter: Payer: Self-pay | Admitting: Physical Therapy

## 2019-12-04 DIAGNOSIS — R262 Difficulty in walking, not elsewhere classified: Secondary | ICD-10-CM | POA: Diagnosis not present

## 2019-12-04 DIAGNOSIS — M6281 Muscle weakness (generalized): Secondary | ICD-10-CM | POA: Insufficient documentation

## 2019-12-04 DIAGNOSIS — M545 Low back pain, unspecified: Secondary | ICD-10-CM | POA: Insufficient documentation

## 2019-12-04 DIAGNOSIS — G8929 Other chronic pain: Secondary | ICD-10-CM | POA: Diagnosis not present

## 2019-12-04 DIAGNOSIS — R293 Abnormal posture: Secondary | ICD-10-CM | POA: Insufficient documentation

## 2019-12-04 NOTE — Patient Instructions (Signed)
Access Code: [email or web address]  URL: [email or web address]: [email or web address] Malta PaaExercises  Supine Lower Trunk Rotation - 2 x daily - 7 x weekly - 2 sets - 10 reps - 10 hold  Supine Single Knee to Chest Stretch - 1 x daily - 7 x weekly - 1 sets - 5 reps - 30 hold  Supine Posterior Pelvic Tilt - 1 x daily - 7 x weekly - 2 sets - 10 reps - 5 hold  Supine Bridge - 1 x daily - 7 x weekly - 2 sets - 10 reps - 5 hold  Supine Hamstring Stretch with Strap - 1 x daily - 7 x weekly - 1 sets - 3 reps - 30 hold

## 2019-12-04 NOTE — Therapy (Signed)
[place], [place], [address] Phone: [phone]   Fax:  [phone]  Physical Therapy Treatment  Patient Details  Name: [place] Kim MRN: [id number] Date of Birth: [birth year] Referring Provider (PT): Dr. Lynne Leader /Dr. Charlann Boxer    Encounter Date: 12/04/2019   PT End of Session - 12/04/19 1410    Visit Number 2    Number of Visits 8    Date for PT Re-Evaluation 01/04/20    Authorization Type Health team advantage, MCR- ?KX mod?    PT Start Time 1400    PT Stop Time 1455    PT Time Calculation (min) 55 min    Activity Tolerance Patient tolerated treatment well    Behavior During Therapy WFL for tasks assessed/performed           Past Medical History:  Diagnosis Date  . Arthritis   . Cancer [facility])    Colon  . Hyperlipidemia   . Hypertension   . Melanoma [facility])     Past Surgical History:  Procedure Laterality Date  . BALLOON DILATION N/A 04/02/2015   Procedure: BALLOON DILATION;  Surgeon: Bjorn Loser, MD;  Location: [facility];  Service: Urology;  Laterality: N/A;  . CATARACT EXTRACTION    . COLON SURGERY     1981 - colon cancer  . CYSTOSCOPY WITH RETROGRADE PYELOGRAM, URETEROSCOPY AND STENT PLACEMENT N/A 04/02/2015   Procedure: CYSTO WITH RETROGRADE ;  Surgeon: Bjorn Loser, MD;  Location: [facility];  Service: Urology;  Laterality: N/A;  . Left thub surgery    . TONSILLECTOMY    . URETHRAL STRICTURE DILATATION      There were no vitals filed for this visit.   Subjective Assessment - 12/04/19 1405    Subjective Patient has been in the hospital for UTI.  No pain currently.  The medicine is making my really tired.    Pain Score 0-No pain                [facility] Adult PT Treatment/Exercise - 12/04/19 0001      Lumbar Exercises: Stretches   Active Hamstring Stretch 2 reps    Single Knee to Chest Stretch Right;3 reps    Piriformis Stretch Right;3 reps    Other Lumbar Stretch  Exercise LTR with knees crossed       Lumbar Exercises: Aerobic   Nustep 6 min UE and LE L5       Lumbar Exercises: Supine   Bridge 10 reps    Bridge Limitations 2 sets       Lumbar Exercises: Sidelying   Hip Abduction 20 reps      Knee/Hip Exercises: Stretches   Hip Flexor Stretch Right;3 reps    Hip Flexor Stretch Limitations passive on high table       Modalities   Modalities Moist Heat      Moist Heat Therapy   Number Minutes Moist Heat 10 Minutes    Moist Heat Location Lumbar Spine;Hip      Manual Therapy   Manual Therapy Soft tissue mobilization;Myofascial release;Manual Traction    Manual therapy comments SI compression Rt side     Soft tissue mobilization Rt glute medius , maximius     Myofascial Release Rt trunk     Manual Traction Rt LE caudal pressure to Rt iliac crest                     PT Short Term Goals -  12/04/19 1458      PT SHORT TERM GOAL #1   Title Pt will understand FOTO results and potential to improve function    Status On-going      PT SHORT TERM GOAL #2   Title Pt will be I with HEP for low back, hips    Status On-going      PT SHORT TERM GOAL #3   Title Pt will note improved ability to rise from the chair after sitting > 30 min (less soreness)    Status On-going             PT Long Term Goals - 11/09/19 1611      PT LONG TERM GOAL #1   Title Pt will be able to show Independence with HEP    Time 8    Period Weeks    Status New    Target Date 01/04/20      PT LONG TERM GOAL #2   Title Pt will be able to get up from the floor using chair to push up to standing    Time 8    Period Weeks    Status New    Target Date 01/04/20      PT LONG TERM GOAL #3   Title Pt will improve his FOTO score to 65% or better    Baseline 63%    Target Date 01/04/20      PT LONG TERM GOAL #4   Title Pt will be able to walk for 20 min and have no increased Rt sided low back pain    Time 8    Period Weeks    Status New    Target  Date 01/04/20      PT LONG TERM GOAL #5   Title Pt will increase Rt hip abd/glute med  strength to 5/5 to improve gait and pelvic stability                 Plan - 12/04/19 1411    Clinical Impression Statement Patient presents following weeks of no PT and a brief hospitalization for UTI. He is having increased fatigue from a new medicine.  He has not done his HEP but has had intermittent pain in Rt proximal hip and back. Reissued HEP and offered manual PT and stretching to Rt LE.    PT Treatment/Interventions ADLs/Self Care Home Management;Therapeutic activities;Patient/family education;Therapeutic exercise;Neuromuscular re-education;Manual techniques;Dry needling;Passive range of motion;Functional mobility training;Ultrasound;Cryotherapy;Moist Heat;Electrical Stimulation;Balance training    PT Next Visit Plan check HEP , consider more balance testing. manual R    PT Home Exercise Plan [id number]: LTR with knees crossed,quad rocking and knee to chest, PPT , bridging, sidelying hip abd    Consulted and Agree with Plan of Care Patient           Patient will benefit from skilled therapeutic intervention in order to improve the following deficits and impairments:  Increased fascial restricitons, Pain, Obesity, Impaired flexibility, Decreased range of motion, Decreased strength, Decreased mobility, Hypomobility  Visit Diagnosis: Muscle weakness (generalized)  Abnormal posture  Difficulty in walking, not elsewhere classified  Chronic bilateral low back pain without sciatica     Problem List Patient Active Problem List   Diagnosis Date Noted  . UTI (urinary tract infection) 11/15/2019  . Arthritis of sacroiliac joint of both sides 07/25/2019  . Degenerative disc disease, lumbar 05/08/2019  . Anserine bursitis 12/20/2018  . Gluteal tendinitis of right buttock 10/13/2017  . Right hip pain 09/16/2017  .  Right knee sprain 02/27/2015    Darlis Wragg 12/04/2019, 3:04 PM  [facility] [address] [place], [place], [address] Phone: [phone]   Fax:  [phone]  Name: [place] MRN: [id number] Date of Birth: 18-Jun-1924   Raeford Razor, PT 12/04/19 3:04 PM Phone: [phone] Fax: [phone]

## 2019-12-11 ENCOUNTER — Ambulatory Visit: Payer: PPO | Admitting: Physical Therapy

## 2019-12-13 DIAGNOSIS — R5383 Other fatigue: Secondary | ICD-10-CM | POA: Diagnosis not present

## 2019-12-13 DIAGNOSIS — R6883 Chills (without fever): Secondary | ICD-10-CM | POA: Diagnosis not present

## 2019-12-14 DIAGNOSIS — R5383 Other fatigue: Secondary | ICD-10-CM | POA: Diagnosis not present

## 2019-12-14 DIAGNOSIS — R6883 Chills (without fever): Secondary | ICD-10-CM | POA: Diagnosis not present

## 2019-12-14 DIAGNOSIS — R509 Fever, unspecified: Secondary | ICD-10-CM | POA: Diagnosis not present

## 2019-12-14 DIAGNOSIS — Z03818 Encounter for observation for suspected exposure to other biological agents ruled out: Secondary | ICD-10-CM | POA: Diagnosis not present

## 2019-12-14 DIAGNOSIS — D649 Anemia, unspecified: Secondary | ICD-10-CM | POA: Diagnosis not present

## 2019-12-18 ENCOUNTER — Ambulatory Visit: Payer: PPO | Admitting: Physical Therapy

## 2019-12-18 DIAGNOSIS — R0989 Other specified symptoms and signs involving the circulatory and respiratory systems: Secondary | ICD-10-CM | POA: Diagnosis not present

## 2019-12-18 DIAGNOSIS — R062 Wheezing: Secondary | ICD-10-CM | POA: Diagnosis not present

## 2019-12-18 DIAGNOSIS — R5383 Other fatigue: Secondary | ICD-10-CM | POA: Diagnosis not present

## 2019-12-20 ENCOUNTER — Ambulatory Visit
Admission: RE | Admit: 2019-12-20 | Discharge: 2019-12-20 | Disposition: A | Discharge status: Home / Self Care | Payer: PPO | Source: Ambulatory Visit | Attending: Family Medicine | Admitting: Family Medicine

## 2019-12-20 ENCOUNTER — Other Ambulatory Visit: Payer: Self-pay | Admitting: Family Medicine

## 2019-12-20 DIAGNOSIS — R062 Wheezing: Secondary | ICD-10-CM

## 2019-12-20 DIAGNOSIS — J181 Lobar pneumonia, unspecified organism: Secondary | ICD-10-CM | POA: Diagnosis not present

## 2019-12-25 ENCOUNTER — Ambulatory Visit: Payer: PPO | Admitting: Physical Therapy

## 2019-12-29 DIAGNOSIS — H52203 Unspecified astigmatism, bilateral: Secondary | ICD-10-CM | POA: Diagnosis not present

## 2019-12-29 DIAGNOSIS — H5203 Hypermetropia, bilateral: Secondary | ICD-10-CM | POA: Diagnosis not present

## 2019-12-29 DIAGNOSIS — H353132 Nonexudative age-related macular degeneration, bilateral, intermediate dry stage: Secondary | ICD-10-CM | POA: Diagnosis not present

## 2020-01-01 ENCOUNTER — Ambulatory Visit: Payer: PPO | Attending: Internal Medicine | Admitting: Physical Therapy

## 2020-01-01 ENCOUNTER — Encounter: Payer: Self-pay | Admitting: Physical Therapy

## 2020-01-01 ENCOUNTER — Other Ambulatory Visit: Payer: Self-pay

## 2020-01-01 VITALS — HR 120

## 2020-01-01 DIAGNOSIS — R262 Difficulty in walking, not elsewhere classified: Secondary | ICD-10-CM | POA: Diagnosis not present

## 2020-01-01 DIAGNOSIS — G8929 Other chronic pain: Secondary | ICD-10-CM | POA: Diagnosis not present

## 2020-01-01 DIAGNOSIS — M545 Low back pain, unspecified: Secondary | ICD-10-CM | POA: Diagnosis not present

## 2020-01-01 DIAGNOSIS — M6281 Muscle weakness (generalized): Secondary | ICD-10-CM | POA: Diagnosis not present

## 2020-01-01 DIAGNOSIS — R293 Abnormal posture: Secondary | ICD-10-CM | POA: Diagnosis not present

## 2020-01-01 NOTE — Therapy (Signed)
[place], [place], [address] Phone: [phone]   Fax:  [phone]  Physical Therapy Treatment/Discharge  Patient Details  Name: [place] Kim MRN: [id number] Date of Birth: 1924/10/29 Referring Provider (PT): Dr. Lynne Leader /Dr. Charlann Boxer    Encounter Date: 01/01/2020   PT End of Session - 01/01/20 1446    Visit Number 3    Number of Visits 8    Date for PT Re-Evaluation 01/04/20    Authorization Type Health team advantage, MCR- ?KX mod?    PT Start Time 1400    PT Stop Time 1445    PT Time Calculation (min) 45 min    Activity Tolerance Patient tolerated treatment well    Behavior During Therapy WFL for tasks assessed/performed           Past Medical History:  Diagnosis Date  . Arthritis   . Cancer [facility])    Colon  . Hyperlipidemia   . Hypertension   . Melanoma [facility])     Past Surgical History:  Procedure Laterality Date  . BALLOON DILATION N/A 04/02/2015   Procedure: BALLOON DILATION;  Surgeon: Bjorn Loser, MD;  [facility];  Service: Urology;  Laterality: N/A;  . CATARACT EXTRACTION    . COLON SURGERY     1981 - colon cancer  . CYSTOSCOPY WITH RETROGRADE PYELOGRAM, URETEROSCOPY AND STENT PLACEMENT N/A 04/02/2015   Procedure: CYSTO WITH RETROGRADE ;  Surgeon: Bjorn Loser, MD;  [facility];  Service: Urology;  Laterality: N/A;  . Left thub surgery    . TONSILLECTOMY    . URETHRAL STRICTURE DILATATION      Vitals:   01/01/20 1408  Pulse: (!) 120  SpO2: 96%     Subjective Assessment - 01/01/20 1405    Subjective No pain, feels back to baseline from a physical standpoint (bronchitis, UTI).  Occ. cough.              [facility] PT Assessment - 01/01/20 0001      Strength   Right Hip Flexion 4+/5    Right Hip ABduction 4+/5    Left Hip Flexion 4+/5    Left Hip ABduction 4+/5    Right Knee Flexion 5/5    Right Knee Extension 5/5    Left Knee Flexion 5/5     Left Knee Extension 5/5            [facility] Adult PT Treatment/Exercise - 01/01/20 0001      Lumbar Exercises: Stretches   Active Hamstring Stretch 5 reps    Active Hamstring Stretch Limitations seated    Lower Trunk Rotation 10 seconds    Lower Trunk Rotation Limitations x 10      Lumbar Exercises: Standing   Heel Raises 20 reps      Lumbar Exercises: Seated   Sit to Stand 10 reps    Sit to Stand Limitations used 8 lbs from 2nd set    Other Seated Lumbar Exercises HR up to 131      Lumbar Exercises: Supine   Bridge 10 reps    Bridge Limitations 2 sets       Lumbar Exercises: Sidelying   Hip Abduction 20 reps      Knee/Hip Exercises: Standing   SLS with Vectors hip abduction x 20      Knee/Hip Exercises: Seated   Sit to Sand 2 sets;10 reps  PT Short Term Goals - 01/01/20 1447      PT SHORT TERM GOAL #1   Title Pt will understand FOTO results and potential to improve function    Status Achieved      PT SHORT TERM GOAL #2   Title Pt will be I with HEP for low back, hips    Status Achieved      PT SHORT TERM GOAL #3   Title Pt will note improved ability to rise from the chair after sitting > 30 min (less soreness)    Baseline min improved    Status Partially Met             PT Long Term Goals - 01/01/20 1416      PT LONG TERM GOAL #1   Title Pt will be able to show Independence with HEP    Status Achieved      PT LONG TERM GOAL #2   Title Pt will be able to get up from the floor using chair to push up to standing    Baseline verbally reports can do with a chair, does his exercises    Status Achieved      PT LONG TERM GOAL #3   Title Pt will improve his FOTO score to 65% or better    Baseline 59%    Status Not Met      PT LONG TERM GOAL #4   Title Pt will be able to walk for 20 min and have no increased Rt sided low back pain    Baseline can do 30 min right now, weather permitting    Status Achieved      PT LONG TERM  GOAL #5   Title Pt will increase Rt hip abd/glute med  strength to 5/5 to improve gait and pelvic stability    Baseline Rt hip 4+/5 abduction    Status Partially Met                 Plan - 01/01/20 [place] has met his LTG other than FOTO.  He only came to PT x 3 visits due to 2 separate illnesses .  His back pain is not limiting his function at this time other than pain when transferring to stand.  fatigue is main limiting factor.    PT Treatment/Interventions ADLs/Self Care Home Management;Therapeutic activities;Patient/family education;Therapeutic exercise;Neuromuscular re-education;Manual techniques;Dry needling;Passive range of motion;Functional mobility training;Ultrasound;Cryotherapy;Moist Heat;Electrical Stimulation;Balance training    PT Next Visit Plan NA< Discharge    PT Home Exercise Plan [id number]: LTR with knees crossed,quad rocking and knee to chest, PPT , bridging, sidelying hip abd. Sit to stand.    Consulted and Agree with Plan of Care Patient           Patient will benefit from skilled therapeutic intervention in order to improve the following deficits and impairments:  Increased fascial restricitons,Pain,Obesity,Impaired flexibility,Decreased range of motion,Decreased strength,Decreased mobility,Hypomobility  Visit Diagnosis: Muscle weakness (generalized)  Abnormal posture  Difficulty in walking, not elsewhere classified  Chronic bilateral low back pain without sciatica     Problem List Patient Active Problem List   Diagnosis Date Noted  . UTI (urinary tract infection) 11/15/2019  . Arthritis of sacroiliac joint of both sides 07/25/2019  . Degenerative disc disease, lumbar 05/08/2019  . Anserine bursitis 12/20/2018  . Gluteal tendinitis of right buttock 10/13/2017  . Right hip pain 09/16/2017  . Right knee sprain 02/27/2015    [name] 01/01/2020, 2:47  PM  [facility] [address] [place], [place], [address] Phone: [phone]   Fax:  [phone]  Name: Stryker E Ysaguirre MRN: [id number] Date of Birth: 10/28/1924  PHYSICAL THERAPY DISCHARGE SUMMARY  Visits from Start of Care: 3  Current functional level related to goals / functional outcomes: See above    Remaining deficits: Tightness in hips, trunk ROM    Education / Equipment: HEP, posture, activity  Plan: Patient agrees to discharge.  Patient goals were met. Patient is being discharged due to meeting the stated rehab goals.  ?????     , PT 01/01/20 2:48 PM Phone: [phone] Fax: [phone]  

## 2020-01-08 ENCOUNTER — Encounter: Payer: PPO | Admitting: Physical Therapy

## 2020-01-15 ENCOUNTER — Encounter: Payer: PPO | Admitting: Physical Therapy

## 2020-01-22 ENCOUNTER — Encounter: Payer: PPO | Admitting: Physical Therapy

## 2020-02-26 ENCOUNTER — Other Ambulatory Visit: Payer: Self-pay | Admitting: Family Medicine

## 2020-05-03 ENCOUNTER — Other Ambulatory Visit: Payer: Self-pay

## 2020-05-03 ENCOUNTER — Encounter (INDEPENDENT_AMBULATORY_CARE_PROVIDER_SITE_OTHER): Payer: Self-pay | Admitting: Otolaryngology

## 2020-05-03 ENCOUNTER — Ambulatory Visit (INDEPENDENT_AMBULATORY_CARE_PROVIDER_SITE_OTHER): Payer: Medicare HMO | Admitting: Otolaryngology

## 2020-05-03 VITALS — Temp 97.3°F

## 2020-05-03 DIAGNOSIS — H6123 Impacted cerumen, bilateral: Secondary | ICD-10-CM | POA: Diagnosis not present

## 2020-05-03 DIAGNOSIS — H903 Sensorineural hearing loss, bilateral: Secondary | ICD-10-CM | POA: Diagnosis not present

## 2020-05-03 NOTE — Progress Notes (Signed)
HPI: [place] Kim is a 87 y.o. male who presents for evaluation of wax buildup in his ears obstructing his hearing aids.  He wears bilateral hearing aids.  He was last cleaned in July of last year..  Past Medical History:  Diagnosis Date  . Arthritis   . Cancer [facility])    Colon  . Hyperlipidemia   . Hypertension   . Melanoma [facility])    Past Surgical History:  Procedure Laterality Date  . BALLOON DILATION N/A 04/02/2015   Procedure: BALLOON DILATION;  Surgeon: Bjorn Loser, MD;  [facility];  Service: Urology;  Laterality: N/A;  . CATARACT EXTRACTION    . COLON SURGERY     1981 - colon cancer  . CYSTOSCOPY WITH RETROGRADE PYELOGRAM, URETEROSCOPY AND STENT PLACEMENT N/A 04/02/2015   Procedure: CYSTO WITH RETROGRADE ;  Surgeon: Bjorn Loser, MD;  Location: [facility];  Service: Urology;  Laterality: N/A;  . Left thub surgery    . TONSILLECTOMY    . URETHRAL STRICTURE DILATATION     Social History   Socioeconomic History  . Marital status: Married    Spouse name: Not on file  . Number of children: 3  . Years of education: 60  . Highest education level: Not on file  Occupational History  . Not on file  Tobacco Use  . Smoking status: Former Smoker    Quit date: 01/31/1977    Years since quitting: 43.2  . Smokeless tobacco: Never Used  Vaping Use  . Vaping Use: Never used  Substance and Sexual Activity  . Alcohol use: Yes    Alcohol/week: 1.0 standard drink    Types: 1 Glasses of wine per week  . Drug use: No  . Sexual activity: Not on file  Other Topics Concern  . Not on file  Social History Narrative  . Not on file   Social Determinants of Health   Financial Resource Strain: Not on file  Food Insecurity: Not on file  Transportation Needs: Not on file  Physical Activity: Not on file  Stress: Not on file  Social Connections: Not on file   No family history on file. Allergies  Allergen Reactions  . Sulfa Antibiotics Rash   Prior to Admission medications    Medication Sig Start Date End Date Taking? Authorizing Provider  acetaminophen (TYLENOL) 500 MG tablet Take 500-1,000 mg by mouth every 6 (six) hours as needed for moderate pain.    [provider]  amLODipine (NORVASC) 2.5 MG tablet Take 2.5 mg by mouth 2 (two) times daily.    [provider]  diphenhydramine-acetaminophen (TYLENOL PM) 25-500 MG TABS tablet Take 1 tablet by mouth at bedtime.    [provider]  finasteride (PROSCAR) 5 MG tablet Take 5 mg by mouth daily. Evening    [provider]  gabapentin (NEURONTIN) 100 MG capsule TAKE TWO CAPSULES BY MOUTH AT BEDTIME 02/27/20   [name], DO  Multiple Vitamins-Minerals (PRESERVISION AREDS 2+MULTI VIT PO) Take 2 tablets by mouth daily.    [provider]  nitrofurantoin (MACRODANTIN) 100 MG capsule Take 100 mg by mouth 4 (four) times daily.    [provider]     Positive ROS: Otherwise negative  All other systems have been reviewed and were otherwise negative with the exception of those mentioned in the HPI and as above.  Physical Exam: Constitutional: Alert, well-appearing, no acute distress Ears: External ears without lesions or tenderness. Ear canals are small bilaterally with moderate wax  buildup in both ear canals that was cleaned with curette and forceps.  TMs were clear bilaterally.. Nasal: External nose without lesions. Clear nasal passages Oral: Oropharynx clear. Neck: No palpable adenopathy or masses Respiratory: Breathing comfortably  Skin: No facial/neck lesions or rash noted.  Cerumen impaction removal  Date/Time: 05/03/2020 2:55 PM Performed by: Rozetta Nunnery, MD Authorized by: Rozetta Nunnery, MD   Consent:    Consent obtained:  Verbal   Consent given by:  Patient   Risks discussed:  Pain and bleeding Procedure details:    Location:  L ear and R ear   Procedure type: curette and forceps   Post-procedure details:    Inspection:  TM  intact and canal normal   Hearing quality:  Improved   Patient tolerance of procedure:  Tolerated well, no immediate complications Comments:     Patient with small ear canals on both sides.  He had moderate wax buildup that was cleaned with curette and forceps.  TMs were clear bilaterally.    Assessment: Bilateral cerumen buildup in patient with bilateral hearing aids.  Plan: This was cleaned in the office.  He will follow-up as needed.  Radene Journey, MD

## 2020-05-22 ENCOUNTER — Other Ambulatory Visit: Payer: Self-pay | Admitting: Family Medicine

## 2020-05-28 IMAGING — MR MR LUMBAR SPINE W/O CM
4 of 5 series · 24 of 48 positions shown · non-contrast
Comparison: Lumbar radiographs 05/08/2019, 11/01/2017.

[HOSPITAL] renal ultrasound 08/15/2015.

CLINICAL DATA: [AGE] male with low back pain radiating to the
hips, progressive x 6 months with no known injury. Hip injections 2
weeks ago without improvement.

EXAM:
MRI LUMBAR SPINE WITHOUT CONTRAST
TECHNIQUE: Multiplanar, multisequence MR imaging of the lumbar spine was
performed. No intravenous contrast was administered.

[Series 3: T2 · sagittal · 4.0mm · 0.55mm/px · 6 of 16 slices shown (1 of 2)]
[im 1/16]
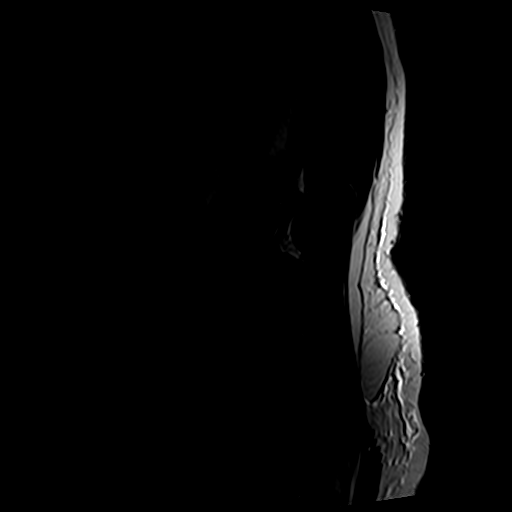
[im 4/16]
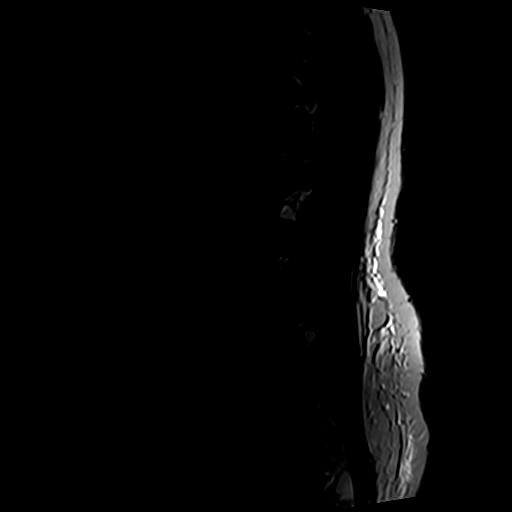
[im 7/16]
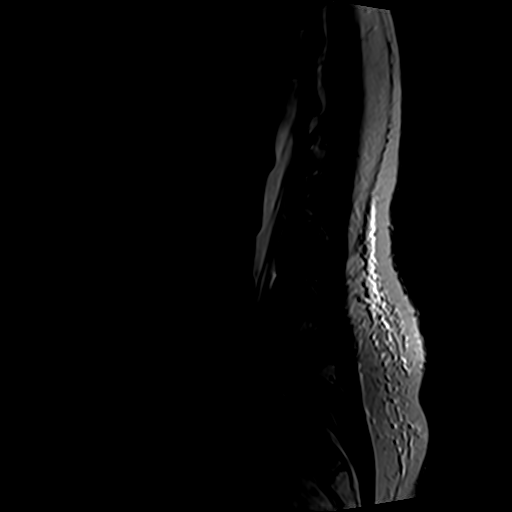
[im 10/16]
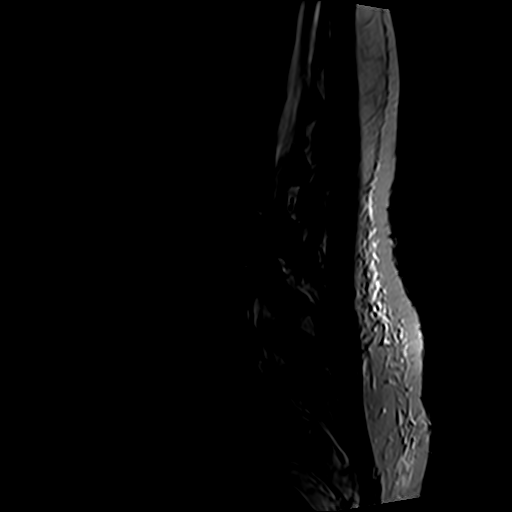
[im 13/16]
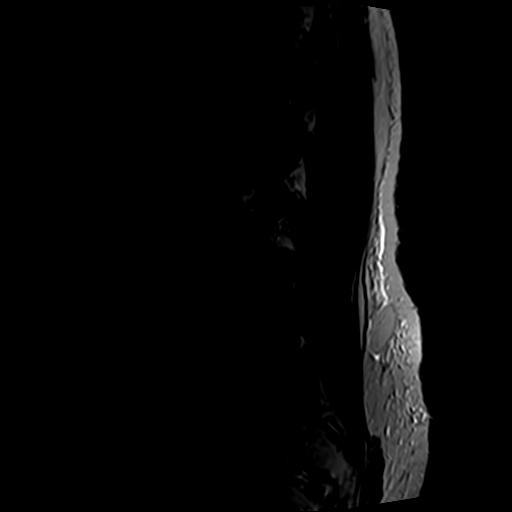
[im 16/16]
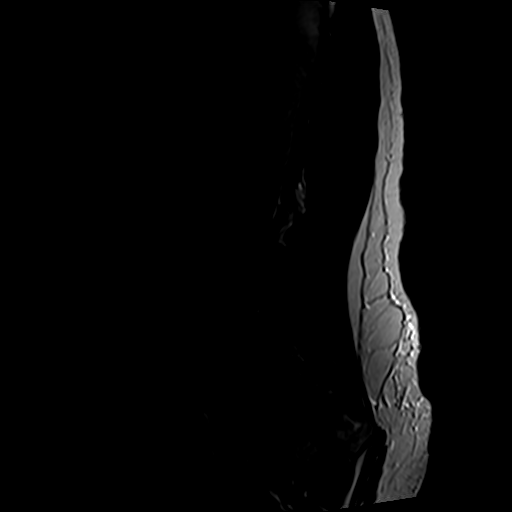

[Series 5: T1 · sagittal · 4.0mm · 0.55mm/px · 6 of 16 slices shown (1 of 2)]
[im 1/16]
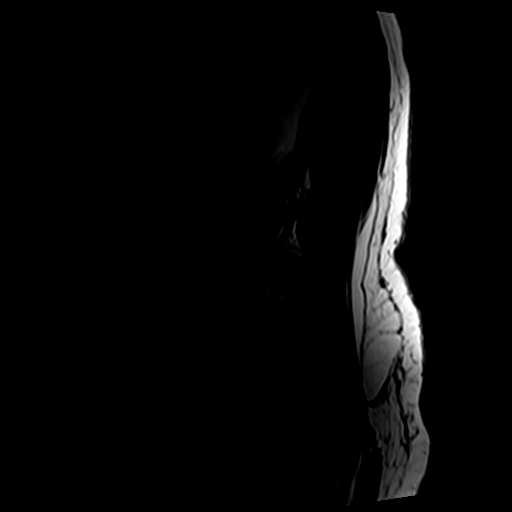
[im 4/16]
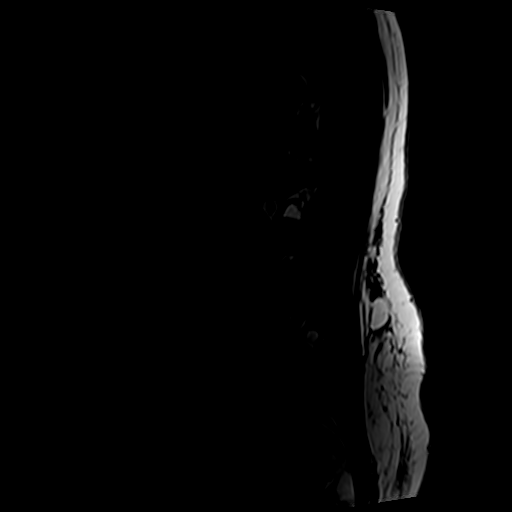
[im 7/16]
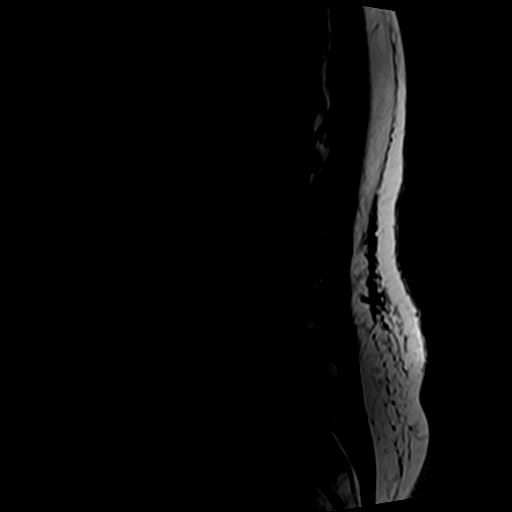
[im 10/16]
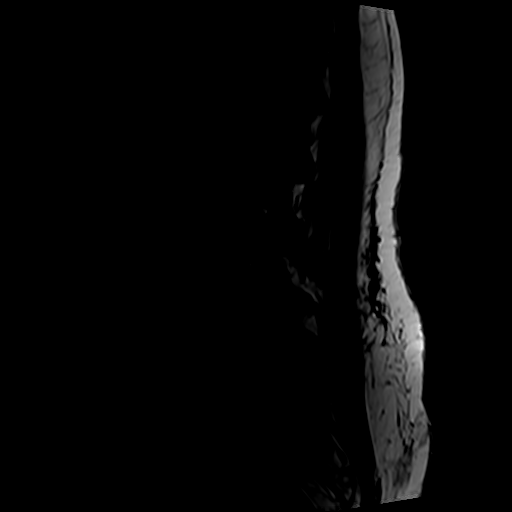
[im 13/16]
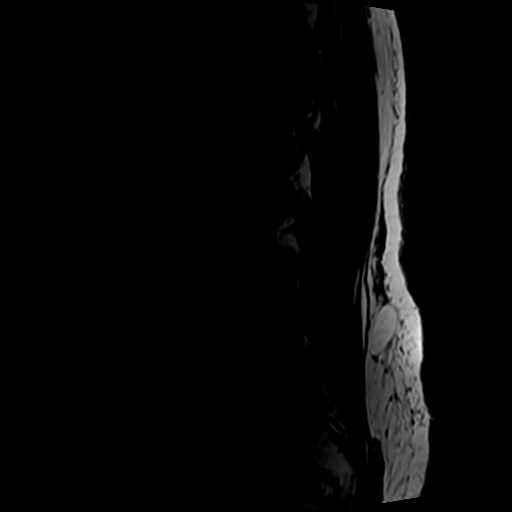
[im 16/16]
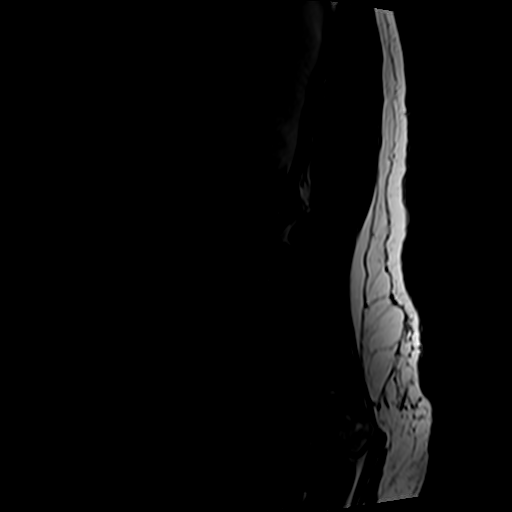

[Series 6: T2 · axial · 4.0mm · 0.70mm/px · z∈[-125,+84]mm · 9 of 38 slices shown (2 of 2)]
[im 1/38]
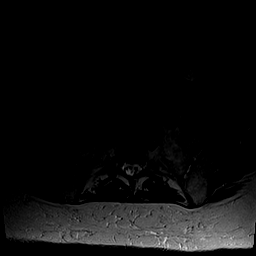
[im 6/38]
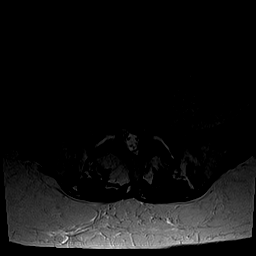
[im 11/38]
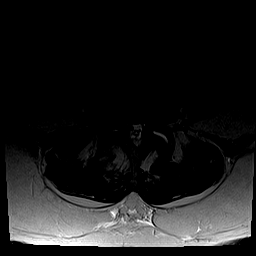
[im 16/38]
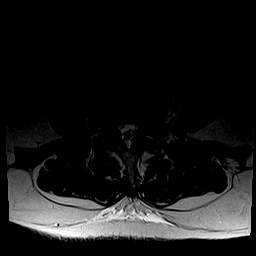
[im 19/38]
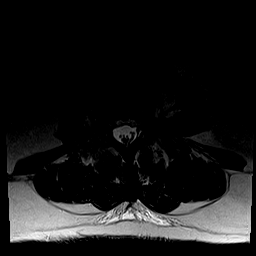
[im 22/38]
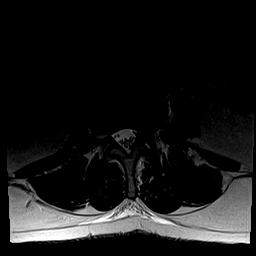
[im 27/38]
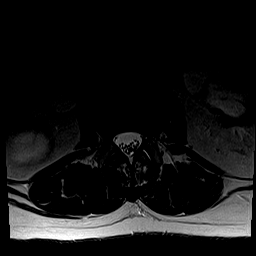
[im 32/38]
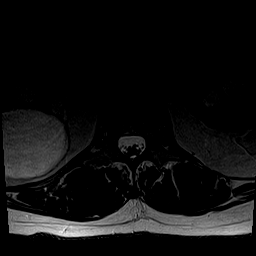
[im 38/38]
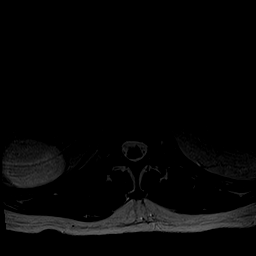

[Series 7: T1 · axial · 4.0mm · 0.35mm/px · z∈[-99,+53]mm · 3 of 38 slices shown (2 of 2)]
[im 6/38]
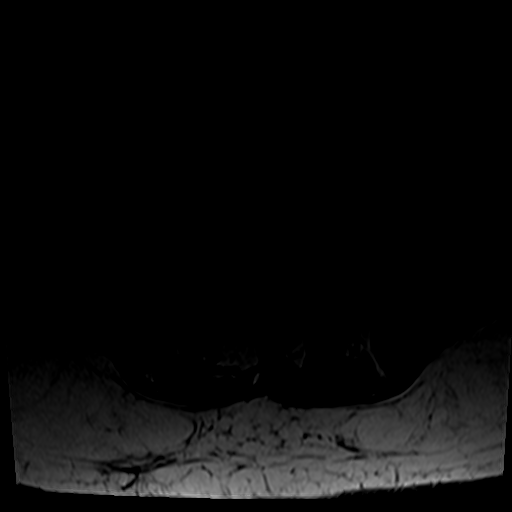
[im 19/38]
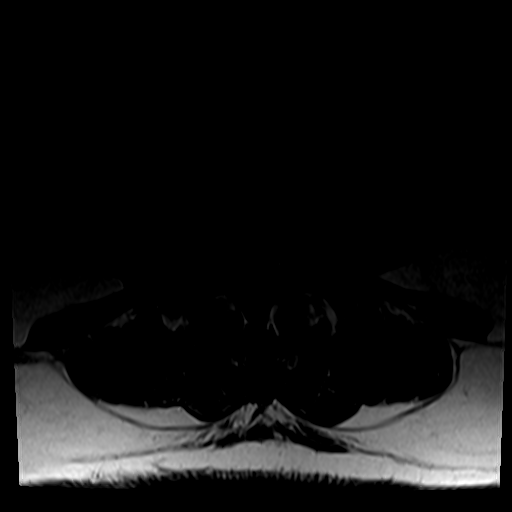
[im 32/38]
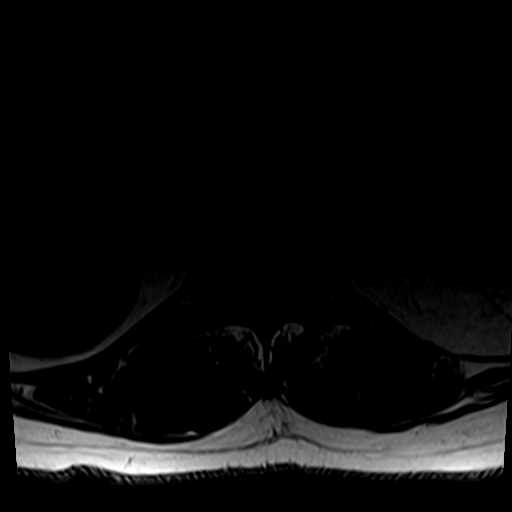

[24 of 48 positions shown; findings below may reference images not displayed]

FINDINGS: Segmentation:  Normal on the comparisons.

Alignment: Stable lumbar lordosis. Subtle anterolisthesis of L4 on
L5.

Vertebrae: Degenerative endplate marrow edema posteriorly and
eccentric to the right at L4-L5. There is also low level
degenerative appearing marrow edema in the posterior elements at
that level, see additional details below. Normal background bone
marrow signal. No other marrow edema or acute osseous abnormality.
Intact visible sacrum and SI joints.

Conus medullaris and cauda equina: Conus extends to the L1 level. No
lower spinal cord or conus signal abnormality.

Paraspinal and other soft tissues: Partially visible polycystic
renal disease.

But superimposed bilateral moderate to severe hydroureter, the left
ureter is up to 14 mm. The right ureter is about 12 mm. Bilateral
hydronephrosis. Evidence of renal cortical volume loss. Partially
visible trabeculated urinary bladder wall (series 3, image 9). No
hydronephrosis in 2155.

Disc levels:

T11-T12: Negative.

T12-L1:  Negative.

L1-L2: Mild far lateral disc bulging. Mild posterior element
hypertrophy. No stenosis.

L2-L3: Circumferential disc bulge and mild to moderate posterior
element hypertrophy. Moderate left L2 foraminal stenosis but no
spinal or lateral recess stenosis.

L3-L4: Disc space loss with circumferential disc osteophyte complex
eccentric to the left. Moderate posterior element hypertrophy. Trace
facet joint fluid. Moderate left lateral recess stenosis. Borderline
to mild spinal stenosis. Severe left L3 foraminal stenosis in part
due to broad-based disc protrusion (series 3, image 12). Mild right
foraminal stenosis.

L4-L5: Mild grade 1 anterolisthesis. Circumferential disc osteophyte
complex with moderate to severe posterior element hypertrophy and
bilateral facet joint fluid. Moderate right lateral recess stenosis.
Mild to moderate spinal stenosis. Moderate left and moderate to
severe right L4 foraminal stenosis.

L5-S1: Broad-based central to slightly left paracentral disc
protrusion with moderate to severe facet and left ligament flavum
hypertrophy. Severe left lateral recess stenosis (left S1 nerve
level). Moderate bilateral L5 foraminal stenosis. Borderline to mild
spinal stenosis.
IMPRESSION: 1. Severe bilateral hydronephrosis and hydroureter which is new
since the 2155 [HOSPITAL] renal ultrasound.
Partially visible trabeculated bladder wall. Recommend repeat
Urology consultation.

2. Lumbar spine degeneration with multifactorial mild spinal
stenosis but moderate to severe lateral recess and neural foraminal
stenosis at L3-L4 through L5-S1. Degenerative appearing marrow edema
at the L4-L5 level.

#1 will be called to the ordering clinician or representative by the
Radiologist Assistant, and communication documented in the PACS or
[REDACTED].

## 2020-06-20 ENCOUNTER — Encounter: Payer: Self-pay | Admitting: Family Medicine

## 2020-06-20 ENCOUNTER — Ambulatory Visit: Payer: Self-pay

## 2020-06-20 ENCOUNTER — Other Ambulatory Visit: Payer: Self-pay

## 2020-06-20 ENCOUNTER — Ambulatory Visit: Payer: Medicare HMO | Admitting: Family Medicine

## 2020-06-20 VITALS — BP 140/78 | HR 95 | Ht 66.0 in | Wt 168.4 lb

## 2020-06-20 DIAGNOSIS — M47818 Spondylosis without myelopathy or radiculopathy, sacral and sacrococcygeal region: Secondary | ICD-10-CM

## 2020-06-20 NOTE — Patient Instructions (Signed)
Thank you for coming in today.   We can do this shot every 3 months.   Let me know if it does not last. Pain doctors can ablate the nerves for that joint.

## 2020-06-20 NOTE — Progress Notes (Signed)
   I, Wendy Poet, LAT, ATC, am serving as scribe for Dr. Lynne Leader.  [place] is a 87 y.o. male who presents to [place] [facility] today for bilat hip pain. Pt had prior L L4-5 epidural on 05/25/19. Pt was last seen by Dr. Georgina Snell on 10/12/19 and was given a R SIJ steroid injection and was referred to PT of which he completed 3 visits, the last of which was 01/01/20. Today, pt reports R SIJ pain x approximately one month.   He denies any radiating pain.    Dx imaging: 05/16/19 L-spine MRI  11/01/17 L-spine XR  Pertinent review of systems: No fevers or chills  Relevant historical information: Arthritis   Exam:  BP 140/78 (BP Location: Right Arm, Patient Position: Sitting, Cuff Size: Normal)   Pulse 95   Ht 5\' 6"  (1.676 m)   Wt 168 lb 6.4 oz (76.4 kg)   SpO2 98%   BMI 27.18 kg/m  General: Well Developed, well nourished, and in no acute distress.   MSK: Right SI joint tender to palpation. Antalgic gait   Lab and Radiology Results  Procedure: Real-time Ultrasound Guided Injection of right SI joint Device: Philips Affiniti 50G Images permanently stored and available for review in PACS Verbal informed consent obtained.  Discussed risks and benefits of procedure. Warned about infection bleeding damage to structures skin hypopigmentation and fat atrophy among others. Patient expresses understanding and agreement Time-out conducted.   Noted no overlying erythema, induration, or other signs of local infection.   Skin prepped in a sterile fashion.   Local anesthesia: Topical Ethyl chloride.   With sterile technique and under real time ultrasound guidance: 40 mg of Kenalog and 2 mL of Marcaine injected into right SI joint. Fluid seen entering the joint capsule.   Completed without difficulty   Pain immediately resolved suggesting accurate placement of the medication.   Advised to call if fevers/chills, erythema, induration, drainage, or persistent bleeding.    Images permanently stored and available for review in the ultrasound unit.  Impression: Technically successful ultrasound guided injection.         Assessment and Plan: 87 y.o. male with right SI joint pain chronic recurrent issue.  Pain thought to be due to DJD.  Plan for repeat injections.  These injections are occurring less frequently than every 3 months which is ideal.  Plan for repeat injection today and if needed certainly could repeat the injection again in the future.  If injections start recurring more frequently than every 3 months he may be a good candidate to refer to pain management for consideration of ablation.  Recheck as needed.  Patient agrees with plan.   PDMP not reviewed this encounter. Orders Placed This Encounter  Procedures   [place] LIMITED JOINT SPACE STRUCTURES LOW RIGHT(NO LINKED CHARGES)    Order Specific Question:   Reason for Exam (SYMPTOM  OR DIAGNOSIS REQUIRED)    Answer:   R SIJ pain    Order Specific Question:   Preferred imaging location?    Answer:   [place]   No orders of the defined types were placed in this encounter.    Discussed warning signs or symptoms. Please see discharge instructions. Patient expresses understanding.   The above documentation has been reviewed and is accurate and complete Lynne Leader, M.D.

## 2020-06-20 NOTE — Patient Instructions (Signed)
Good to see you today.  You had a R SI joint injection today.  Call or go to the ER if you develop a large red swollen joint with extreme pain or oozing puss.   Follow-up as needed.

## 2020-07-30 ENCOUNTER — Ambulatory Visit (HOSPITAL_BASED_OUTPATIENT_CLINIC_OR_DEPARTMENT_OTHER)
Admission: RE | Admit: 2020-07-30 | Discharge: 2020-07-30 | Disposition: A | Discharge status: Home / Self Care | Payer: Medicare HMO | Source: Ambulatory Visit | Attending: Family Medicine | Admitting: Family Medicine

## 2020-07-30 ENCOUNTER — Other Ambulatory Visit (HOSPITAL_BASED_OUTPATIENT_CLINIC_OR_DEPARTMENT_OTHER): Payer: Self-pay | Admitting: Family Medicine

## 2020-07-30 ENCOUNTER — Other Ambulatory Visit: Payer: Self-pay

## 2020-07-30 DIAGNOSIS — R1033 Periumbilical pain: Secondary | ICD-10-CM

## 2020-09-10 ENCOUNTER — Emergency Department (HOSPITAL_BASED_OUTPATIENT_CLINIC_OR_DEPARTMENT_OTHER)
Admission: EM | Admit: 2020-09-10 | Discharge: 2020-09-10 | Disposition: A | Discharge status: Home / Self Care | Payer: Medicare HMO | Attending: Emergency Medicine | Admitting: Emergency Medicine

## 2020-09-10 ENCOUNTER — Encounter (HOSPITAL_BASED_OUTPATIENT_CLINIC_OR_DEPARTMENT_OTHER): Payer: Self-pay

## 2020-09-10 ENCOUNTER — Other Ambulatory Visit: Payer: Self-pay

## 2020-09-10 DIAGNOSIS — Z5321 Procedure and treatment not carried out due to patient leaving prior to being seen by health care provider: Secondary | ICD-10-CM | POA: Insufficient documentation

## 2020-09-10 DIAGNOSIS — R1030 Lower abdominal pain, unspecified: Secondary | ICD-10-CM | POA: Diagnosis present

## 2020-09-10 LAB — URINALYSIS, ROUTINE W REFLEX MICROSCOPIC
Bilirubin Urine: NEGATIVE
Glucose, UA: NEGATIVE mg/dL
Hgb urine dipstick: NEGATIVE
Ketones, ur: NEGATIVE mg/dL
Nitrite: NEGATIVE
Specific Gravity, Urine: 1.011 (ref 1.005–1.030)
pH: 6 (ref 5.0–8.0)

## 2020-09-10 NOTE — ED Notes (Signed)
Patient called to Examination Room with No Answer several Times.

## 2020-09-10 NOTE — ED Triage Notes (Signed)
Pt arrives POV, with c/o of "achy" lower abdominal pain starting last night.  Denies fever, nausea, vomiting, diarrhea.  States last normal BM was Sunday.

## 2020-10-02 ENCOUNTER — Other Ambulatory Visit: Payer: Self-pay | Admitting: Family Medicine

## 2020-10-02 MED ORDER — GABAPENTIN 100 MG PO CAPS: 200.0000 mg | ORAL_CAPSULE | Freq: Every day | ORAL | 3 refills | Status: AC

## 2020-10-02 NOTE — Telephone Encounter (Signed)
Gabapentin refilled

## 2021-02-06 DIAGNOSIS — Z961 Presence of intraocular lens: Secondary | ICD-10-CM | POA: Diagnosis not present

## 2021-02-06 DIAGNOSIS — H353133 Nonexudative age-related macular degeneration, bilateral, advanced atrophic without subfoveal involvement: Secondary | ICD-10-CM | POA: Diagnosis not present

## 2021-03-18 DIAGNOSIS — R109 Unspecified abdominal pain: Secondary | ICD-10-CM | POA: Diagnosis not present

## 2021-03-18 DIAGNOSIS — R7303 Prediabetes: Secondary | ICD-10-CM | POA: Diagnosis not present

## 2021-03-18 DIAGNOSIS — R110 Nausea: Secondary | ICD-10-CM | POA: Diagnosis not present

## 2021-03-19 ENCOUNTER — Emergency Department (HOSPITAL_COMMUNITY): Payer: PPO

## 2021-03-19 ENCOUNTER — Inpatient Hospital Stay (HOSPITAL_COMMUNITY)
Admission: EM | Admit: 2021-03-19 | Discharge: 2021-04-06 | DRG: 987 | Disposition: A | Discharge status: Home Health | Payer: PPO | Source: Ambulatory Visit | Attending: Internal Medicine | Admitting: Internal Medicine

## 2021-03-19 ENCOUNTER — Encounter (HOSPITAL_COMMUNITY): Payer: Self-pay | Admitting: Family Medicine

## 2021-03-19 DIAGNOSIS — Z0389 Encounter for observation for other suspected diseases and conditions ruled out: Secondary | ICD-10-CM | POA: Diagnosis not present

## 2021-03-19 DIAGNOSIS — K279 Peptic ulcer, site unspecified, unspecified as acute or chronic, without hemorrhage or perforation: Secondary | ICD-10-CM | POA: Diagnosis not present

## 2021-03-19 DIAGNOSIS — E871 Hypo-osmolality and hyponatremia: Secondary | ICD-10-CM | POA: Diagnosis present

## 2021-03-19 DIAGNOSIS — R578 Other shock: Secondary | ICD-10-CM | POA: Diagnosis not present

## 2021-03-19 DIAGNOSIS — N3289 Other specified disorders of bladder: Secondary | ICD-10-CM | POA: Diagnosis not present

## 2021-03-19 DIAGNOSIS — N1330 Unspecified hydronephrosis: Secondary | ICD-10-CM | POA: Diagnosis not present

## 2021-03-19 DIAGNOSIS — N189 Chronic kidney disease, unspecified: Secondary | ICD-10-CM

## 2021-03-19 DIAGNOSIS — N170 Acute kidney failure with tubular necrosis: Principal | ICD-10-CM | POA: Diagnosis present

## 2021-03-19 DIAGNOSIS — Z515 Encounter for palliative care: Secondary | ICD-10-CM | POA: Diagnosis not present

## 2021-03-19 DIAGNOSIS — Y92031 Bathroom in apartment as the place of occurrence of the external cause: Secondary | ICD-10-CM | POA: Diagnosis not present

## 2021-03-19 DIAGNOSIS — N35919 Unspecified urethral stricture, male, unspecified site: Secondary | ICD-10-CM | POA: Diagnosis present

## 2021-03-19 DIAGNOSIS — A4159 Other Gram-negative sepsis: Secondary | ICD-10-CM | POA: Diagnosis not present

## 2021-03-19 DIAGNOSIS — R748 Abnormal levels of other serum enzymes: Secondary | ICD-10-CM | POA: Diagnosis not present

## 2021-03-19 DIAGNOSIS — Z6824 Body mass index (BMI) 24.0-24.9, adult: Secondary | ICD-10-CM

## 2021-03-19 DIAGNOSIS — I6529 Occlusion and stenosis of unspecified carotid artery: Secondary | ICD-10-CM | POA: Diagnosis not present

## 2021-03-19 DIAGNOSIS — R945 Abnormal results of liver function studies: Secondary | ICD-10-CM | POA: Diagnosis not present

## 2021-03-19 DIAGNOSIS — N492 Inflammatory disorders of scrotum: Secondary | ICD-10-CM | POA: Diagnosis not present

## 2021-03-19 DIAGNOSIS — D62 Acute posthemorrhagic anemia: Secondary | ICD-10-CM | POA: Diagnosis not present

## 2021-03-19 DIAGNOSIS — S199XXA Unspecified injury of neck, initial encounter: Secondary | ICD-10-CM | POA: Diagnosis not present

## 2021-03-19 DIAGNOSIS — K2970 Gastritis, unspecified, without bleeding: Secondary | ICD-10-CM | POA: Diagnosis present

## 2021-03-19 DIAGNOSIS — K269 Duodenal ulcer, unspecified as acute or chronic, without hemorrhage or perforation: Secondary | ICD-10-CM | POA: Diagnosis not present

## 2021-03-19 DIAGNOSIS — K259 Gastric ulcer, unspecified as acute or chronic, without hemorrhage or perforation: Secondary | ICD-10-CM | POA: Diagnosis present

## 2021-03-19 DIAGNOSIS — K649 Unspecified hemorrhoids: Secondary | ICD-10-CM | POA: Diagnosis present

## 2021-03-19 DIAGNOSIS — Z85820 Personal history of malignant melanoma of skin: Secondary | ICD-10-CM

## 2021-03-19 DIAGNOSIS — D631 Anemia in chronic kidney disease: Secondary | ICD-10-CM | POA: Diagnosis not present

## 2021-03-19 DIAGNOSIS — Z452 Encounter for adjustment and management of vascular access device: Secondary | ICD-10-CM

## 2021-03-19 DIAGNOSIS — K922 Gastrointestinal hemorrhage, unspecified: Secondary | ICD-10-CM | POA: Diagnosis not present

## 2021-03-19 DIAGNOSIS — K6289 Other specified diseases of anus and rectum: Secondary | ICD-10-CM | POA: Diagnosis present

## 2021-03-19 DIAGNOSIS — J9690 Respiratory failure, unspecified, unspecified whether with hypoxia or hypercapnia: Secondary | ICD-10-CM

## 2021-03-19 DIAGNOSIS — R7989 Other specified abnormal findings of blood chemistry: Secondary | ICD-10-CM

## 2021-03-19 DIAGNOSIS — K2991 Gastroduodenitis, unspecified, with bleeding: Secondary | ICD-10-CM | POA: Diagnosis not present

## 2021-03-19 DIAGNOSIS — K828 Other specified diseases of gallbladder: Secondary | ICD-10-CM | POA: Diagnosis not present

## 2021-03-19 DIAGNOSIS — K7200 Acute and subacute hepatic failure without coma: Secondary | ICD-10-CM | POA: Diagnosis not present

## 2021-03-19 DIAGNOSIS — R6521 Severe sepsis with septic shock: Secondary | ICD-10-CM | POA: Diagnosis not present

## 2021-03-19 DIAGNOSIS — N433 Hydrocele, unspecified: Secondary | ICD-10-CM | POA: Diagnosis not present

## 2021-03-19 DIAGNOSIS — Z882 Allergy status to sulfonamides status: Secondary | ICD-10-CM

## 2021-03-19 DIAGNOSIS — E872 Acidosis, unspecified: Secondary | ICD-10-CM | POA: Diagnosis not present

## 2021-03-19 DIAGNOSIS — N179 Acute kidney failure, unspecified: Secondary | ICD-10-CM

## 2021-03-19 DIAGNOSIS — R000 Tachycardia, unspecified: Secondary | ICD-10-CM | POA: Diagnosis not present

## 2021-03-19 DIAGNOSIS — L02215 Cutaneous abscess of perineum: Secondary | ICD-10-CM | POA: Diagnosis not present

## 2021-03-19 DIAGNOSIS — W1812XA Fall from or off toilet with subsequent striking against object, initial encounter: Secondary | ICD-10-CM | POA: Diagnosis present

## 2021-03-19 DIAGNOSIS — N400 Enlarged prostate without lower urinary tract symptoms: Secondary | ICD-10-CM | POA: Diagnosis not present

## 2021-03-19 DIAGNOSIS — E785 Hyperlipidemia, unspecified: Secondary | ICD-10-CM | POA: Diagnosis not present

## 2021-03-19 DIAGNOSIS — S0083XA Contusion of other part of head, initial encounter: Secondary | ICD-10-CM | POA: Diagnosis present

## 2021-03-19 DIAGNOSIS — R109 Unspecified abdominal pain: Secondary | ICD-10-CM | POA: Diagnosis not present

## 2021-03-19 DIAGNOSIS — N184 Chronic kidney disease, stage 4 (severe): Secondary | ICD-10-CM | POA: Diagnosis present

## 2021-03-19 DIAGNOSIS — R7303 Prediabetes: Secondary | ICD-10-CM | POA: Diagnosis not present

## 2021-03-19 DIAGNOSIS — E860 Dehydration: Secondary | ICD-10-CM | POA: Diagnosis present

## 2021-03-19 DIAGNOSIS — M5030 Other cervical disc degeneration, unspecified cervical region: Secondary | ICD-10-CM | POA: Diagnosis present

## 2021-03-19 DIAGNOSIS — Z85038 Personal history of other malignant neoplasm of large intestine: Secondary | ICD-10-CM

## 2021-03-19 DIAGNOSIS — G9341 Metabolic encephalopathy: Secondary | ICD-10-CM | POA: Diagnosis not present

## 2021-03-19 DIAGNOSIS — K2101 Gastro-esophageal reflux disease with esophagitis, with bleeding: Secondary | ICD-10-CM | POA: Diagnosis not present

## 2021-03-19 DIAGNOSIS — N35819 Other urethral stricture, male, unspecified site: Secondary | ICD-10-CM | POA: Diagnosis not present

## 2021-03-19 DIAGNOSIS — J9811 Atelectasis: Secondary | ICD-10-CM | POA: Diagnosis not present

## 2021-03-19 DIAGNOSIS — J9601 Acute respiratory failure with hypoxia: Secondary | ICD-10-CM | POA: Diagnosis not present

## 2021-03-19 DIAGNOSIS — N131 Hydronephrosis with ureteral stricture, not elsewhere classified: Secondary | ICD-10-CM | POA: Diagnosis present

## 2021-03-19 DIAGNOSIS — K264 Chronic or unspecified duodenal ulcer with hemorrhage: Secondary | ICD-10-CM | POA: Diagnosis not present

## 2021-03-19 DIAGNOSIS — R0602 Shortness of breath: Secondary | ICD-10-CM | POA: Diagnosis not present

## 2021-03-19 DIAGNOSIS — R579 Shock, unspecified: Secondary | ICD-10-CM | POA: Diagnosis not present

## 2021-03-19 DIAGNOSIS — E441 Mild protein-calorie malnutrition: Secondary | ICD-10-CM | POA: Diagnosis not present

## 2021-03-19 DIAGNOSIS — K429 Umbilical hernia without obstruction or gangrene: Secondary | ICD-10-CM | POA: Diagnosis not present

## 2021-03-19 DIAGNOSIS — I10 Essential (primary) hypertension: Secondary | ICD-10-CM | POA: Diagnosis present

## 2021-03-19 DIAGNOSIS — R5383 Other fatigue: Secondary | ICD-10-CM | POA: Diagnosis not present

## 2021-03-19 DIAGNOSIS — I129 Hypertensive chronic kidney disease with stage 1 through stage 4 chronic kidney disease, or unspecified chronic kidney disease: Secondary | ICD-10-CM | POA: Diagnosis present

## 2021-03-19 DIAGNOSIS — Z4659 Encounter for fitting and adjustment of other gastrointestinal appliance and device: Secondary | ICD-10-CM

## 2021-03-19 DIAGNOSIS — Z87891 Personal history of nicotine dependence: Secondary | ICD-10-CM

## 2021-03-19 DIAGNOSIS — J069 Acute upper respiratory infection, unspecified: Secondary | ICD-10-CM

## 2021-03-19 DIAGNOSIS — Z4682 Encounter for fitting and adjustment of non-vascular catheter: Secondary | ICD-10-CM | POA: Diagnosis not present

## 2021-03-19 DIAGNOSIS — K5900 Constipation, unspecified: Secondary | ICD-10-CM | POA: Diagnosis not present

## 2021-03-19 DIAGNOSIS — I700 Atherosclerosis of aorta: Secondary | ICD-10-CM | POA: Diagnosis not present

## 2021-03-19 DIAGNOSIS — E875 Hyperkalemia: Secondary | ICD-10-CM | POA: Diagnosis present

## 2021-03-19 DIAGNOSIS — K3189 Other diseases of stomach and duodenum: Secondary | ICD-10-CM | POA: Diagnosis not present

## 2021-03-19 DIAGNOSIS — A498 Other bacterial infections of unspecified site: Secondary | ICD-10-CM | POA: Diagnosis not present

## 2021-03-19 DIAGNOSIS — R7402 Elevation of levels of lactic acid dehydrogenase (LDH): Secondary | ICD-10-CM | POA: Diagnosis present

## 2021-03-19 DIAGNOSIS — Z7189 Other specified counseling: Secondary | ICD-10-CM | POA: Diagnosis not present

## 2021-03-19 DIAGNOSIS — B964 Proteus (mirabilis) (morganii) as the cause of diseases classified elsewhere: Secondary | ICD-10-CM | POA: Diagnosis not present

## 2021-03-19 DIAGNOSIS — K2100 Gastro-esophageal reflux disease with esophagitis, without bleeding: Secondary | ICD-10-CM | POA: Diagnosis present

## 2021-03-19 DIAGNOSIS — A419 Sepsis, unspecified organism: Secondary | ICD-10-CM | POA: Diagnosis not present

## 2021-03-19 DIAGNOSIS — K274 Chronic or unspecified peptic ulcer, site unspecified, with hemorrhage: Secondary | ICD-10-CM | POA: Diagnosis not present

## 2021-03-19 DIAGNOSIS — R110 Nausea: Secondary | ICD-10-CM | POA: Diagnosis not present

## 2021-03-19 DIAGNOSIS — K921 Melena: Secondary | ICD-10-CM | POA: Diagnosis not present

## 2021-03-19 DIAGNOSIS — K2980 Duodenitis without bleeding: Secondary | ICD-10-CM | POA: Diagnosis not present

## 2021-03-19 DIAGNOSIS — Z20822 Contact with and (suspected) exposure to covid-19: Secondary | ICD-10-CM | POA: Diagnosis present

## 2021-03-19 DIAGNOSIS — I959 Hypotension, unspecified: Secondary | ICD-10-CM | POA: Diagnosis not present

## 2021-03-19 DIAGNOSIS — M4313 Spondylolisthesis, cervicothoracic region: Secondary | ICD-10-CM | POA: Diagnosis not present

## 2021-03-19 DIAGNOSIS — Z79899 Other long term (current) drug therapy: Secondary | ICD-10-CM

## 2021-03-19 DIAGNOSIS — K449 Diaphragmatic hernia without obstruction or gangrene: Secondary | ICD-10-CM | POA: Diagnosis not present

## 2021-03-19 DIAGNOSIS — D72828 Other elevated white blood cell count: Secondary | ICD-10-CM | POA: Diagnosis not present

## 2021-03-19 DIAGNOSIS — N320 Bladder-neck obstruction: Secondary | ICD-10-CM | POA: Diagnosis present

## 2021-03-19 DIAGNOSIS — S0990XA Unspecified injury of head, initial encounter: Secondary | ICD-10-CM | POA: Diagnosis not present

## 2021-03-19 DIAGNOSIS — M419 Scoliosis, unspecified: Secondary | ICD-10-CM | POA: Diagnosis not present

## 2021-03-19 DIAGNOSIS — M1990 Unspecified osteoarthritis, unspecified site: Secondary | ICD-10-CM | POA: Diagnosis present

## 2021-03-19 LAB — COMPREHENSIVE METABOLIC PANEL
ALT: 146 U/L — ABNORMAL HIGH (ref 0–44)
AST: 107 U/L — ABNORMAL HIGH (ref 15–41)
Albumin: 3.4 g/dL — ABNORMAL LOW (ref 3.5–5.0)
Alkaline Phosphatase: 77 U/L (ref 38–126)
Anion gap: 12 (ref 5–15)
BUN: 69 mg/dL — ABNORMAL HIGH (ref 8–23)
CO2: 16 mmol/L — ABNORMAL LOW (ref 22–32)
Calcium: 8.6 mg/dL — ABNORMAL LOW (ref 8.9–10.3)
Chloride: 102 mmol/L (ref 98–111)
Creatinine, Ser: 4.58 mg/dL — ABNORMAL HIGH (ref 0.61–1.24)
GFR, Estimated: 11 mL/min — ABNORMAL LOW (ref >60)
Glucose, Bld: 122 mg/dL — ABNORMAL HIGH (ref 70–99)
Potassium: 5.3 mmol/L — ABNORMAL HIGH (ref 3.5–5.1)
Sodium: 130 mmol/L — ABNORMAL LOW (ref 135–145)
Total Bilirubin: 0.5 mg/dL (ref 0.3–1.2)
Total Protein: 7.7 g/dL (ref 6.5–8.1)

## 2021-03-19 LAB — CBC WITH DIFFERENTIAL/PLATELET
Abs Immature Granulocytes: 0.07 10*3/uL (ref 0.00–0.07)
Basophils Absolute: 0.1 10*3/uL (ref 0.0–0.1)
Basophils Relative: 1 %
Eosinophils Absolute: 0 10*3/uL (ref 0.0–0.5)
Eosinophils Relative: 0 %
HCT: 37.8 % — ABNORMAL LOW (ref 39.0–52.0)
Hemoglobin: 12 g/dL — ABNORMAL LOW (ref 13.0–17.0)
Immature Granulocytes: 1 %
Lymphocytes Relative: 12 %
Lymphs Abs: 1.2 10*3/uL (ref 0.7–4.0)
MCH: 30.3 pg (ref 26.0–34.0)
MCHC: 31.7 g/dL (ref 30.0–36.0)
MCV: 95.5 fL (ref 80.0–100.0)
Monocytes Absolute: 2 10*3/uL — ABNORMAL HIGH (ref 0.1–1.0)
Monocytes Relative: 20 %
Neutro Abs: 6.5 10*3/uL (ref 1.7–7.7)
Neutrophils Relative %: 66 %
Platelets: 365 10*3/uL (ref 150–400)
RBC: 3.96 MIL/uL — ABNORMAL LOW (ref 4.22–5.81)
RDW: 14.8 % (ref 11.5–15.5)
WBC: 9.8 10*3/uL (ref 4.0–10.5)
nRBC: 0 % (ref 0.0–0.2)

## 2021-03-19 LAB — RESP PANEL BY RT-PCR (FLU A&B, COVID) ARPGX2
Influenza A by PCR: NEGATIVE
Influenza B by PCR: NEGATIVE
SARS Coronavirus 2 by RT PCR: NEGATIVE

## 2021-03-19 LAB — APTT: aPTT: 31 seconds (ref 24–36)

## 2021-03-19 LAB — CK: Total CK: 74 U/L (ref 49–397)

## 2021-03-19 LAB — LIPASE, BLOOD: Lipase: 32 U/L (ref 11–51)

## 2021-03-19 LAB — PROTIME-INR
INR: 1.2 (ref 0.8–1.2)
Prothrombin Time: 15.1 seconds (ref 11.4–15.2)

## 2021-03-19 MED ORDER — GABAPENTIN 100 MG PO CAPS
200.0000 mg | ORAL_CAPSULE | Freq: Every day | ORAL | Status: DC
  Administered 2021-03-20 – 2021-03-26 (×7): 200 mg via ORAL
  Filled 2021-03-19 (×7): qty 2

## 2021-03-19 MED ORDER — SENNOSIDES-DOCUSATE SODIUM 8.6-50 MG PO TABS
1.0000 | ORAL_TABLET | Freq: Every evening | ORAL | Status: DC | PRN
  Administered 2021-04-02: 1 via ORAL
  Filled 2021-03-19: qty 1

## 2021-03-19 MED ORDER — ACETAMINOPHEN 650 MG RE SUPP: 650.0000 mg | Freq: Four times a day (QID) | RECTAL | Status: DC | PRN

## 2021-03-19 MED ORDER — ONDANSETRON HCL 4 MG PO TABS: 4.0000 mg | ORAL_TABLET | Freq: Four times a day (QID) | ORAL | Status: DC | PRN

## 2021-03-19 MED ORDER — SIMVASTATIN 10 MG PO TABS
20.0000 mg | ORAL_TABLET | Freq: Every day | ORAL | Status: DC
  Administered 2021-03-20 – 2021-03-26 (×5): 20 mg via ORAL
  Filled 2021-03-19 (×2): qty 1
  Filled 2021-03-19 (×3): qty 2
  Filled 2021-03-19: qty 1

## 2021-03-19 MED ORDER — PROSIGHT PO TABS
1.0000 | ORAL_TABLET | Freq: Every day | ORAL | Status: DC
  Administered 2021-03-20 – 2021-03-27 (×6): 1 via ORAL
  Filled 2021-03-19 (×6): qty 1

## 2021-03-19 MED ORDER — SODIUM ZIRCONIUM CYCLOSILICATE 5 G PO PACK
5.0000 g | PACK | Freq: Once | ORAL | Status: AC
  Administered 2021-03-19: 5 g via ORAL
  Filled 2021-03-19: qty 1

## 2021-03-19 MED ORDER — FINASTERIDE 5 MG PO TABS
5.0000 mg | ORAL_TABLET | Freq: Every day | ORAL | Status: DC
  Administered 2021-03-20 – 2021-03-27 (×6): 5 mg via ORAL
  Filled 2021-03-19 (×6): qty 1

## 2021-03-19 MED ORDER — ACETAMINOPHEN 325 MG PO TABS
650.0000 mg | ORAL_TABLET | Freq: Four times a day (QID) | ORAL | Status: DC | PRN
  Administered 2021-03-21 – 2021-03-22 (×3): 650 mg via ORAL
  Filled 2021-03-19 (×3): qty 2

## 2021-03-19 MED ORDER — LACTATED RINGERS IV BOLUS
1000.0000 mL | Freq: Once | INTRAVENOUS | Status: AC
  Administered 2021-03-19: 1000 mL via INTRAVENOUS

## 2021-03-19 MED ORDER — ONDANSETRON HCL 4 MG/2ML IJ SOLN: 4.0000 mg | Freq: Four times a day (QID) | INTRAMUSCULAR | Status: DC | PRN

## 2021-03-19 MED ORDER — ENOXAPARIN SODIUM 30 MG/0.3ML IJ SOSY
30.0000 mg | PREFILLED_SYRINGE | INTRAMUSCULAR | Status: DC
  Administered 2021-03-20 – 2021-03-22 (×3): 30 mg via SUBCUTANEOUS
  Filled 2021-03-19 (×3): qty 0.3

## 2021-03-19 MED ORDER — AMLODIPINE BESYLATE 5 MG PO TABS
2.5000 mg | ORAL_TABLET | Freq: Two times a day (BID) | ORAL | Status: DC
  Administered 2021-03-20 – 2021-03-22 (×7): 2.5 mg via ORAL
  Filled 2021-03-19 (×7): qty 1

## 2021-03-19 MED ORDER — OXYCODONE HCL 5 MG PO TABS
5.0000 mg | ORAL_TABLET | Freq: Once | ORAL | Status: AC
  Administered 2021-03-19: 5 mg via ORAL
  Filled 2021-03-19: qty 1

## 2021-03-19 MED ORDER — LACTATED RINGERS IV SOLN: INTRAVENOUS | Status: DC

## 2021-03-19 MED ORDER — PRESERVISION AREDS 2+MULTI VIT PO CAPS: ORAL_CAPSULE | Freq: Every day | ORAL | Status: DC

## 2021-03-19 NOTE — H&P (Signed)
?History and Physical  ? ? ?Patient: Jesus Kim DJM:[id number] DOB: 11-Jul-1924 ?DOA: 03/19/2021 ?DOS: the patient was seen and examined on 03/19/2021 ?PCP: [name], MD  ?Patient coming from: Home ? ?Chief Complaint:  ?Chief Complaint  ?Patient presents with  ? abnormal labs  ? ?HPI: Jesus Kim is a 88 y.o. male with medical history significant of HTN, OA, HLD, CKD 4 who presents after being called by his PCP to come to the emergency room due to abnormal lab work.  He reports he has had fatigue for the last few days and was going for his routine lab work with his PCP today.  States he has had decreased p.o. intake for the last 2 days due to his not feeling well.  He has not had any nausea, vomiting, diarrhea, fever.  He has no specific complaints and denies any chest pain, cough, shortness of breath, palpitations, confusion, urinary symptoms, muscle aches.  Lives at home with his wife.  No recent travel. ?No known sick contacts other than his wife has some mild nasal congestion. ?Was found to have AKI on repeat lab work with creatinine of 4.58 which is increased from his baseline of 2.4-2.5.  Hospitalist service was asked to admit for further management ? ?Review of Systems: As mentioned in the history of present illness. All other systems reviewed and are negative. ?Past Medical History:  ?Diagnosis Date  ? Arthritis   ? Cancer [facility])   ? Colon  ? Hyperlipidemia   ? Hypertension   ? Melanoma ([place])   ? ?Past Surgical History:  ?Procedure Laterality Date  ? BALLOON DILATION N/A 04/02/2015  ? Procedure: BALLOON DILATION;  Surgeon: Bjorn Loser, MD;  Location: [facility] ORS;  Service: Urology;  Laterality: N/A;  ? CATARACT EXTRACTION    ? COLON SURGERY    ? 1981 - colon cancer  ? CYSTOSCOPY WITH RETROGRADE PYELOGRAM, URETEROSCOPY AND STENT PLACEMENT N/A 04/02/2015  ? Procedure: CYSTO WITH RETROGRADE ;  Surgeon: Bjorn Loser, MD;  Location: [facility] ORS;  Service: Urology;  Laterality: N/A;  ? Left thub surgery    ?  TONSILLECTOMY    ? URETHRAL STRICTURE DILATATION    ? ?Social History:  reports that he quit smoking about 44 years ago. His smoking use included cigarettes. He has never used smokeless tobacco. He reports current alcohol use of about 1.0 standard drink per week. He reports that he does not use drugs. ? ?Allergies  ?Allergen Reactions  ? Sulfa Antibiotics Rash  ? ? ?History reviewed. No pertinent family history. ? ?Prior to Admission medications   ?Medication Sig Start Date End Date Taking? Authorizing Provider  ?acetaminophen (TYLENOL) 650 MG CR tablet Take 650 mg by mouth every 8 (eight) hours as needed for pain.   Yes [provider]  ?amLODipine (NORVASC) 2.5 MG tablet Take 2.5 mg by mouth 2 (two) times daily.   Yes [provider]  ?diphenhydramine-acetaminophen (TYLENOL PM) 25-500 MG TABS tablet Take 1 tablet by mouth at bedtime.   Yes [provider]  ?doxycycline (VIBRA-TABS) 100 MG tablet Take 200 mg by mouth daily as needed (uti).   Yes [provider]  ?finasteride (PROSCAR) 5 MG tablet Take 5 mg by mouth daily.   Yes [provider]  ?gabapentin (NEURONTIN) 100 MG capsule Take 2 capsules (200 mg total) by mouth at bedtime. 10/02/20  Yes Gregor Hams, MD  ?lisinopril (ZESTRIL) 20 MG tablet Take 20 mg by mouth daily.   Yes [provider]  ?Multiple Vitamins-Minerals (PRESERVISION AREDS 2+MULTI VIT PO) Take 2 tablets by mouth daily.   Yes [provider]  ?nitrofurantoin (MACRODANTIN) 100 MG capsule Take 100 mg by mouth daily.   Yes [provider]  ?simvastatin (ZOCOR) 20 MG tablet Take 20 mg by mouth daily.   Yes [provider]  ? ? ?Physical Exam: ?Vitals:  ? 03/19/21 1648 03/19/21 1746 03/19/21 1815 03/19/21 1900  ?BP:  (!) 117/59 101/63 117/64  ?Pulse:  (!) 124 (!) 117 (!) 115  ?Resp:  (!) 25 (!) 29 (!) 23  ?Temp:  98.8 ?F (37.1 ?C)    ?TempSrc:  Oral    ?SpO2:  100% 97% 99%  ?Weight: 70.3 kg     ?Height: _0  (1.676 m)      ? ?General: WDWN elderly male. Alert and oriented x3.  ?Eyes: EOMI, PERRL, conjunctivae normal.  Sclera nonicteric ?HENT:  [place]/AT, external ears normal.  Nares patent without epistasis.  Mucous membranes are moist. Posterior pharynx clear of any exudate ?Neck: Soft, normal range of motion, supple, no masses, Trachea midline ?Respiratory: clear to auscultation bilaterally, no wheezing, no crackles. Normal respiratory effort. No accessory muscle use.  ?Cardiovascular: Regular rhythm, tachycardia. no murmurs / rubs / gallops. No extremity edema. 1+ pedal pulses.  ?Abdomen: Soft, no tenderness, nondistended, no rebound or guarding.  No masses palpated. Bowel sounds normoactive ?Musculoskeletal: FROM. no cyanosis. Normal muscle tone.  ?Skin: Warm, dry, intact no rashes. ?Neurologic: CN 2-12 grossly intact.  Normal speech.  Sensation intact. Grip strength 5/5 bilaterally ?Psychiatric: Normal mood.  ? ?Data Reviewed: ?Lab Work: CBC unremarkable.  CK 74 sodium 130 potassium 5.3 chloride 102 bicarb 16 creatinine 4.58 from baseline of 2.4-2.5 BUN 69 glucose 122 alk phos a 77 AST 107 ALT 146 lipase 32 albumin 3.4 bilirubin 0.5 ?COVID-negative   influenza A and B negative ? ?Chest x-ray normal cardiac silhouette.  No infiltrate, consolidation, pleural effusion, pulmonary edema or pneumothorax of lung fields ? ?CT head shows no acute intracranial pathology no skull fracture ?CT cervical spine multi level degenerative disc changes and arthritis of the neck but no acute fracture or dislocation ? ?EKG sinus tachycardia.  No acute ST elevation or depression.  QTc 411 ? ?Assessment and Plan: ?* Acute kidney injury superimposed on CKD ([place]) ?[name] is admitted to telemetry unit.  ?Started on LR for IVF hydration.  ?Recheck electrolytes and renal function in am.  ?AKI secondary to mild dehydration with decreased po intake.  ? ?Hyperkalemia ?Given dose of Lokelma.  ?Monitor electrolytes ? ?Essential hypertension ?Continue home  medications of norvasc.  ?Hold lisinopril with AKI.  ?Monitor BP ? ?Chronic hyponatremia ?Stable at baseline ? ?Advance Care Planning:   Code Status:  Full ? ?Family Communication: Diagnosis and plan discussed with patient.  He verbalized understanding and agrees with plan.  Further recommendations to follow as clinically indicated ? ?Author: ?Eben Burow, MD ?03/19/2021 9:22 PM ? ?For on call review [email or web address].  ?

## 2021-03-19 NOTE — Assessment & Plan Note (Addendum)
Pt has known hx of urethral stricture with chronic bladder outlet obstruction and chronic B hydronephrosis Ordered and reviewed renal US with findings suggesting chronic B hydronephrosis  Appreciate assistance by Urology. Pt is now s/p foley placement, to remain in until f/u with Urology as outpt Cr down to 3.42

## 2021-03-19 NOTE — Assessment & Plan Note (Addendum)
Improved with IVF Repeat bmet in AM

## 2021-03-19 NOTE — Assessment & Plan Note (Addendum)
Had been continued on norvasc, now on hold Hold lisinopril with AKI.  Now off bp meds secondary hypotension

## 2021-03-19 NOTE — ED Triage Notes (Signed)
Pt states he had blood work done today at his PCP. States they called and reported an acute kidney injury and liver abnormality. Pt's only complaint is decreased appetite and feeling tired. ?

## 2021-03-19 NOTE — ED Provider Triage Note (Signed)
Emergency Medicine Provider Triage Evaluation Note ? ?Jesus Kim , a 88 y.o. male  was evaluated in triage.  Pt complains of abnormal labs.  UTI last week.  Continue to have decreased p.o. intake.  Seen by PCP today, obtain labs.  Noted to be in acute renal failure with abnormal liver test.  He states he is urinating without difficulty.  Did fall while sitting on the toilet today hitting his head on the bathtub.  He denies LOC, anticoagulation.  Ambulatory without difficulty.  No fever, chest pain, shortness of breath, emesis, change in urination or bowel movements ? ?Review of Systems  ?Positive: Fall, abnormal lab ?Negative:  ? ?Physical Exam  ?BP 92/62 (BP Location: Left Arm)   Pulse (!) 127   Temp 97.6 ?F (36.4 ?C) (Oral)   Resp 20   Ht '5\' 6"'$  (1.676 m)   Wt 70.3 kg   SpO2 98%   BMI 25.02 kg/m?  ?Gen:   Awake, no distress   ?Head:  Small right forehead hematoma ?Resp:  Normal effort  ?MSK:   Moves extremities without difficulty  ?Other:   ? ?Medical Decision Making  ?Medically screening exam initiated at 5:18 PM.  Appropriate orders placed.  THAD OSORIA was informed that the remainder of the evaluation will be completed by another provider, this initial triage assessment does not replace that evaluation, and the importance of remaining in the ED until their evaluation is complete. ? ?Fall, abnormal lab ?  ?Shaili Donalson A, PA-C ?03/19/21 1719 ? ?

## 2021-03-19 NOTE — ED Provider Notes (Signed)
[place] DEPT Provider Note   CSN: [id number] Arrival date & time: 03/19/21  1636     History  Chief Complaint  Patient presents with   abnormal labs    Jesus Kim is a 88 y.o. male.  HPI Patient is a highly functional 88 year old male presenting from home with antibiotics to primary care doctor following some recent lab work.  His recent history is as follows: He was diagnosed with a UTI last week and did complete a course of antibiotics.  His symptoms of dysuria resolved.  He has had persistent fatigue and this prompted some lab work this morning.  Lab work was concerning for AKI.  Patient has not had any URI symptoms but his wife, who he lives with, has.  Currently he endorses fatigue and generalized weakness.  He has also noticed that he has been breathing heavier than normal.    Home Medications Prior to Admission medications   Medication Sig Start Date End Date Taking? Authorizing Provider  acetaminophen (TYLENOL) 650 MG CR tablet Take 650 mg by mouth every 8 (eight) hours as needed for pain.   Yes [provider]  amLODipine (NORVASC) 2.5 MG tablet Take 2.5 mg by mouth 2 (two) times daily.   Yes [provider]  diphenhydramine-acetaminophen (TYLENOL PM) 25-500 MG TABS tablet Take 1 tablet by mouth at bedtime.   Yes [provider]  doxycycline (VIBRA-TABS) 100 MG tablet Take 200 mg by mouth daily as needed (uti).   Yes [provider]  finasteride (PROSCAR) 5 MG tablet Take 5 mg by mouth daily.   Yes [provider]  gabapentin (NEURONTIN) 100 MG capsule Take 2 capsules (200 mg total) by mouth at bedtime. 10/02/20  Yes Gregor Hams, MD  lisinopril (ZESTRIL) 20 MG tablet Take 20 mg by mouth daily.   Yes [provider]  Multiple Vitamins-Minerals (PRESERVISION AREDS 2+MULTI VIT PO) Take 2 tablets by mouth daily.   Yes [provider]  nitrofurantoin (MACRODANTIN) 100 MG capsule Take  100 mg by mouth daily.   Yes [provider]  simvastatin (ZOCOR) 20 MG tablet Take 20 mg by mouth daily.   Yes [provider]      Allergies    Sulfa antibiotics    Review of Systems   Review of Systems  Constitutional:  Positive for activity change, appetite change and fatigue.  Genitourinary:  Positive for decreased urine volume.  Neurological:  Positive for weakness (generalized).  All other systems reviewed and are negative.  Physical Exam Updated Vital Signs BP (!) 110/53 (BP Location: Left Arm)    Pulse (!) 110    Temp 98.3 F (36.8 C) (Oral)    Resp 20    Ht '5\' 6"'$  (1.676 m)    Wt 70.3 kg    SpO2 100%    BMI 25.02 kg/m  Physical Exam Vitals and nursing note reviewed.  Constitutional:      General: He is not in acute distress.    Appearance: Normal appearance. He is well-developed and normal weight. He is not ill-appearing, toxic-appearing or diaphoretic.  HENT:     Head: Normocephalic and atraumatic.     Right Ear: External ear normal.     Left Ear: External ear normal.     Nose: Nose normal.     Mouth/Throat:     Mouth: Mucous membranes are moist.     Pharynx: Oropharynx is clear.  Eyes:     Extraocular Movements: Extraocular  movements intact.     Conjunctiva/sclera: Conjunctivae normal.  Cardiovascular:     Rate and Rhythm: Regular rhythm. Tachycardia present.     Heart sounds: No murmur heard. Pulmonary:     Effort: Pulmonary effort is normal. Tachypnea present. No respiratory distress.     Breath sounds: Normal breath sounds. No wheezing or rales.  Chest:     Chest wall: No tenderness.  Abdominal:     Palpations: Abdomen is soft.     Tenderness: There is no abdominal tenderness. There is no right CVA tenderness or left CVA tenderness.  Musculoskeletal:        General: No swelling. Normal range of motion.     Cervical back: Normal range of motion and neck supple. No rigidity.     Right lower leg: No edema.     Left lower leg: No edema.   Skin:    General: Skin is warm and dry.     Capillary Refill: Capillary refill takes less than 2 seconds.     Coloration: Skin is not jaundiced or pale.  Neurological:     General: No focal deficit present.     Mental Status: He is alert and oriented to person, place, and time.     Cranial Nerves: No cranial nerve deficit.     Sensory: No sensory deficit.     Motor: No weakness.     Coordination: Coordination normal.  Psychiatric:        Mood and Affect: Mood normal.        Behavior: Behavior normal.    ED Results / Procedures / Treatments   Labs (all labs ordered are listed, but only abnormal results are displayed) Labs Reviewed  CBC WITH DIFFERENTIAL/PLATELET - Abnormal; Notable for the following components:      Result Value   RBC 3.96 (*)    Hemoglobin 12.0 (*)    HCT 37.8 (*)    Monocytes Absolute 2.0 (*)    All other components within normal limits  COMPREHENSIVE METABOLIC PANEL - Abnormal; Notable for the following components:   Sodium 130 (*)    Potassium 5.3 (*)    CO2 16 (*)    Glucose, Bld 122 (*)    BUN 69 (*)    Creatinine, Ser 4.58 (*)    Calcium 8.6 (*)    Albumin 3.4 (*)    AST 107 (*)    ALT 146 (*)    GFR, Estimated 11 (*)    All other components within normal limits  BASIC METABOLIC PANEL - Abnormal; Notable for the following components:   Sodium 128 (*)    CO2 15 (*)    Glucose, Bld 107 (*)    BUN 67 (*)    Creatinine, Ser 4.39 (*)    Calcium 8.2 (*)    GFR, Estimated 12 (*)    All other components within normal limits  CBC - Abnormal; Notable for the following components:   WBC 10.7 (*)    RBC 3.71 (*)    Hemoglobin 11.4 (*)    HCT 35.3 (*)    All other components within normal limits  COMPREHENSIVE METABOLIC PANEL - Abnormal; Notable for the following components:   Sodium 128 (*)    CO2 14 (*)    Glucose, Bld 104 (*)    BUN 91 (*)    Creatinine, Ser 4.90 (*)    Calcium 7.8 (*)    Total Protein 5.7 (*)    Albumin 2.3 (*)  AST 42  (*)    ALT 82 (*)    GFR, Estimated 10 (*)    All other components within normal limits  CBC - Abnormal; Notable for the following components:   RBC 2.78 (*)    Hemoglobin 8.5 (*)    HCT 26.5 (*)    All other components within normal limits  RESP PANEL BY RT-PCR (FLU A&B, COVID) ARPGX2  LIPASE, BLOOD  PROTIME-INR  APTT  CK    EKG EKG Interpretation  Date/Time:  Wednesday March 19 2021 17:22:03 EST Ventricular Rate:  125 PR Interval:  180 QRS Duration: 86 QT Interval:  285 QTC Calculation: 411 R Axis:   49 Text Interpretation: Sinus tachycardia Confirmed by Godfrey Pick [phone]) on 03/19/2021 6:30:10 PM  Radiology CT HEAD WO CONTRAST (5MM)  Result Date: 03/19/2021 CLINICAL DATA:  Fall from toilet today hitting head on bathtub. EXAM: CT HEAD WITHOUT CONTRAST TECHNIQUE: Contiguous axial images were obtained from the base of the skull through the vertex without intravenous contrast. RADIATION DOSE REDUCTION: This exam was performed according to the departmental dose-optimization program which includes automated exposure control, adjustment of the mA and/or kV according to patient size and/or use of iterative reconstruction technique. COMPARISON:  None. FINDINGS: Brain: Normal for age brain atrophy. No intracranial hemorrhage, mass effect, or midline shift. No hydrocephalus. The basilar cisterns are patent. Minor periventricular chronic small vessel ischemia. No evidence of territorial infarct or acute ischemia. No extra-axial or intracranial fluid collection. Vascular: Atherosclerosis of skullbase vasculature without hyperdense vessel or abnormal calcification. Skull: No fracture or focal lesion. Sinuses/Orbits: Paranasal sinuses and mastoid air cells are clear. The visualized orbits are unremarkable. Bilateral lens extraction. Other: No confluent scalp hematoma. IMPRESSION: 1. No acute intracranial abnormality. No skull fracture. 2. Normal for age brain atrophy and chronic small vessel ischemia.  Electronically Signed   By: Keith Rake M.D.   On: 03/19/2021 17:49   CT Cervical Spine Wo Contrast  Result Date: 03/19/2021 CLINICAL DATA:  Neck trauma, midline tenderness (Age 43-64y) Fall from toilet today hitting head on bathtub. EXAM: CT CERVICAL SPINE WITHOUT CONTRAST TECHNIQUE: Multidetector CT imaging of the cervical spine was performed without intravenous contrast. Multiplanar CT image reconstructions were also generated. RADIATION DOSE REDUCTION: This exam was performed according to the departmental dose-optimization program which includes automated exposure control, adjustment of the mA and/or kV according to patient size and/or use of iterative reconstruction technique. COMPARISON:  None. FINDINGS: Alignment: No traumatic subluxation. There is grade 1 degenerative anterolisthesis of C7 on T1 and T1 on T2. slight scoliotic curvature. Skull base and vertebrae: No acute fracture. Vertebral body heights are maintained. The dens and skull base are intact. Modic endplate changes at W5-Y0. Soft tissues and spinal canal: No prevertebral fluid or swelling. No visible canal hematoma. Disc levels: Diffuse degenerative disc disease from C3-C4 through C6-C7. Prominent multilevel facet hypertrophy. Occasional areas of facet ankylosis typically degenerative. Upper chest: Carotid calcifications. Other: None. IMPRESSION: Multilevel degenerative disc disease and facet hypertrophy throughout the cervical spine. No acute fracture or traumatic subluxation. Electronically Signed   By: Keith Rake M.D.   On: 03/19/2021 17:52   US RENAL  Result Date: 03/20/2021 CLINICAL DATA:  Acute renal failure EXAM: RENAL / URINARY TRACT ULTRASOUND COMPLETE COMPARISON:  CT abdomen pelvis 07/30/2020 FINDINGS: Right Kidney: Renal measurements: 11.9 x 6.2 x 6.1 cm = volume: 234 mL. Severe right hydronephrosis and hydroureter. 4.2 x 6 cm right lower pole cyst. Normal cortical echogenicity. Left Kidney: Renal measurements:  9.6 x  4.6 x 5.6 cm = volume: 130 mL. 2 cm left lower pole cyst. Severe left hydronephrosis. Normal renal cortical echogenicity. Bladder: Bladder wall thickening and small diverticula.  No mass lesion. Other: None. IMPRESSION: Severe bilateral hydronephrosis and hydroureter. This is unchanged from the prior CT and appears chronic. Thick-walled bladder may be due to neurogenic bladder or chronic bladder outlet obstruction. Electronically Signed   By: Franchot Gallo M.D.   On: 03/20/2021 08:54   DG Chest Portable 1 View  Result Date: 03/19/2021 CLINICAL DATA:  Fatigue and decreased appetite. EXAM: PORTABLE CHEST 1 VIEW COMPARISON:  December 20, 2019 FINDINGS: The heart size and mediastinal contours are within normal limits. There is mild calcification of the aortic arch. Both lungs are clear. The visualized skeletal structures are unremarkable. IMPRESSION: No active cardiopulmonary disease. Electronically Signed   By: Virgina Norfolk M.D.   On: 03/19/2021 19:34    Procedures Procedures    Medications Ordered in ED Medications  lactated ringers infusion ( Intravenous New Bag/Given 03/20/21 1259)  amLODipine (NORVASC) tablet 2.5 mg (2.5 mg Oral Given 03/21/21 0821)  simvastatin (ZOCOR) tablet 20 mg (20 mg Oral Given 03/21/21 0821)  finasteride (PROSCAR) tablet 5 mg (5 mg Oral Given 03/21/21 0821)  gabapentin (NEURONTIN) capsule 200 mg (200 mg Oral Given 03/20/21 2035)  enoxaparin (LOVENOX) injection 30 mg (30 mg Subcutaneous Given 03/21/21 7902)  acetaminophen (TYLENOL) tablet 650 mg (650 mg Oral Given 03/21/21 1225)    Or  acetaminophen (TYLENOL) suppository 650 mg ( Rectal See Alternative 03/21/21 1225)  ondansetron (ZOFRAN) tablet 4 mg (has no administration in time range)    Or  ondansetron (ZOFRAN) injection 4 mg (has no administration in time range)  senna-docusate (Senokot-S) tablet 1 tablet (has no administration in time range)  multivitamin (PROSIGHT) tablet 1 tablet (1 tablet Oral Given 03/21/21 0821)   benzonatate (TESSALON) capsule 100 mg (has no administration in time range)  lactated ringers bolus 1,000 mL (0 mLs Intravenous Stopped 03/19/21 2242)  sodium zirconium cyclosilicate (LOKELMA) packet 5 g (5 g Oral Given 03/19/21 2241)  oxyCODONE (Oxy IR/ROXICODONE) immediate release tablet 5 mg (5 mg Oral Given 03/19/21 2242)  melatonin tablet 5 mg (5 mg Oral Given 03/20/21 2059)    ED Course/ Medical Decision Making/ A&P                           Medical Decision Making Amount and/or Complexity of Data Reviewed Labs: ordered. Radiology: ordered.  Risk Decision regarding hospitalization.   This patient presents to the ED for concern of fatigue, this involves an extensive number of treatment options, and is a complaint that carries with it a high risk of complications and morbidity.  The differential diagnosis includes viral infection, occult bacterial infection, metabolic abnormalities, metabolic acidosis, anemia   Co morbidities that complicate the patient evaluation  HTN, HLD, melanoma, arthritis   Additional history obtained:  Additional history obtained from patient's daughter External records from outside source obtained and reviewed including EMR   Lab Tests:  I Ordered, and personally interpreted labs.  The pertinent results include: AKI, metabolic acidosis, azotemia   Imaging Studies ordered:  I ordered imaging studies including chest x-ray, CT head, CT cervical spine I independently visualized and interpreted imaging which showed no acute findings I agree with the radiologist interpretation   Cardiac Monitoring:  The patient was maintained on a cardiac monitor.  I personally viewed and interpreted the cardiac monitored  which showed an underlying rhythm of: Sinus rhythm   Medicines ordered and prescription drug management:  I ordered medication including IV fluids for AKI and dehydration Reevaluation of the patient after these medicines showed that the patient  stayed the same I have reviewed the patients home medicines and have made adjustments as needed    Problem List / ED Course:  Patient is a 88 year old male presenting for 1 week of fatigue.  He was found to have AKI with metabolic acidosis on lab work this morning.  There was also concern of liver injury, however, does appear the patient does have chronically elevated transaminases.  Repeat lab work was obtained prior to being bedded in the ED.  This confirmed AKI and metabolic acidosis.  On exam, patient is tachycardic and tachypneic.  His lungs are clear to auscultation x-ray is unremarkable.  I suspect his tachypnea is secondary to acidosis.  I did review his home medications and it does not appear that he is on any nephrotoxic medications.  He denies any chronic NSAID use.  Patient does endorse some 1 week of fatigue and decreased p.o. intake and the dehydration could be a cause of his AKI.  Bolus and infusion of IV fluids was initiated.  Patient was admitted for further management.    Social Determinants of Health:  Has access to outpatient care           Final Clinical Impression(s) / ED Diagnoses Final diagnoses:  AKI (acute kidney injury) ([place])  Metabolic acidosis    Rx / DC Orders ED Discharge Orders     None         Godfrey Pick, MD 03/21/21 1548

## 2021-03-19 NOTE — Assessment & Plan Note (Addendum)
Likely secondary to renal failure K of 5.6 today. See below, pt NPO Will give amp of bicarb, IV insulin with D50 Would recheck bmet in AM

## 2021-03-20 ENCOUNTER — Other Ambulatory Visit: Payer: Self-pay

## 2021-03-20 ENCOUNTER — Inpatient Hospital Stay (HOSPITAL_COMMUNITY): Payer: PPO

## 2021-03-20 DIAGNOSIS — I10 Essential (primary) hypertension: Secondary | ICD-10-CM

## 2021-03-20 LAB — CBC
HCT: 35.3 % — ABNORMAL LOW (ref 39.0–52.0)
Hemoglobin: 11.4 g/dL — ABNORMAL LOW (ref 13.0–17.0)
MCH: 30.7 pg (ref 26.0–34.0)
MCHC: 32.3 g/dL (ref 30.0–36.0)
MCV: 95.1 fL (ref 80.0–100.0)
Platelets: 313 10*3/uL (ref 150–400)
RBC: 3.71 MIL/uL — ABNORMAL LOW (ref 4.22–5.81)
RDW: 14.8 % (ref 11.5–15.5)
WBC: 10.7 10*3/uL — ABNORMAL HIGH (ref 4.0–10.5)
nRBC: 0 % (ref 0.0–0.2)

## 2021-03-20 LAB — BASIC METABOLIC PANEL
Anion gap: 9 (ref 5–15)
BUN: 67 mg/dL — ABNORMAL HIGH (ref 8–23)
CO2: 15 mmol/L — ABNORMAL LOW (ref 22–32)
Calcium: 8.2 mg/dL — ABNORMAL LOW (ref 8.9–10.3)
Chloride: 104 mmol/L (ref 98–111)
Creatinine, Ser: 4.39 mg/dL — ABNORMAL HIGH (ref 0.61–1.24)
GFR, Estimated: 12 mL/min — ABNORMAL LOW (ref >60)
Glucose, Bld: 107 mg/dL — ABNORMAL HIGH (ref 70–99)
Potassium: 4.4 mmol/L (ref 3.5–5.1)
Sodium: 128 mmol/L — ABNORMAL LOW (ref 135–145)

## 2021-03-20 MED ORDER — BENZONATATE 100 MG PO CAPS: 100.0000 mg | ORAL_CAPSULE | Freq: Three times a day (TID) | ORAL | Status: DC | PRN

## 2021-03-20 MED ORDER — MELATONIN 5 MG PO TABS
5.0000 mg | ORAL_TABLET | Freq: Once | ORAL | Status: AC
  Administered 2021-03-20: 21:00:00 5 mg via ORAL
  Filled 2021-03-20: qty 1

## 2021-03-20 NOTE — Progress Notes (Addendum)
Bladder scan shows 343m post void. Pt.had  5030murine output since admission in the unit. MD was notified, will hold off in and out for now. Will continue to monitor. ?

## 2021-03-20 NOTE — Progress Notes (Signed)
?  Transition of Care (TOC) Screening Note ? ? ?Patient Details  ?Name: Jesus Kim ?Date of Birth: Aug 07, 1924 ? ? ?Transition of Care [name]) CM/SW Contact:    ?Dessa Phi, RN ?Phone Number: ?03/20/2021, 1:01 PM ? ? ? ?Transition of Care Department [facility]) has reviewed patient and no TOC needs have been identified at this time. We will continue to monitor patient advancement through interdisciplinary progression rounds. If new patient transition needs arise, please place a TOC consult. ? ? ?

## 2021-03-20 NOTE — Plan of Care (Signed)

## 2021-03-20 NOTE — Progress Notes (Signed)
?  Progress Note ? ? ?Patient: Jesus Kim HEN:[id number] DOB: [birth year] DOA: 03/19/2021     1 ?DOS: the patient was seen and examined on 03/20/2021 ?  ?Brief hospital course: ?88 y.o. male with medical history significant of HTN, OA, HLD, CKD 4 who presents after being called by his PCP to come to the emergency room due to abnormal lab work.  He reports he has had fatigue for the last few days and was going for his routine lab work with his PCP today.  States he has had decreased p.o. intake for the last 2 days due to his not feeling well.  He has not had any nausea, vomiting, diarrhea, fever.  He has no specific complaints and denies any chest pain, cough, shortness of breath, palpitations, confusion, urinary symptoms, muscle aches.  Lives at home with his wife.  No recent travel. ?No known sick contacts other than his wife has some mild nasal congestion. ?Was found to have AKI on repeat lab work with creatinine of 4.58 which is increased from his baseline of 2.4-2.5.  Hospitalist service was asked to admit for further management ? ?Assessment and Plan: ?* Acute kidney injury superimposed on CKD ([place]) ?Started on LR for IVF hydration.  ?Pt has been continued on IVF ?No significant change with renal function with Cr down to 4.39 ?Chart reviewed. Pt has known hx of urethral stricture with chronic bladder outlet obstruction and chronic B hydronephrosis ?Ordered and reviewed renal US with findings suggesting chronic B hydronephrosis  ?Pt reportedly voiding well ?Cont IVF hydration, repeat bmet in AM ? ?Hyperkalemia ?Given dose of Lokelma.  ?Potasium  Normalized ?Repeat bmet in AM ? ?Essential hypertension ?Continue home medications of norvasc.  ?Hold lisinopril with AKI.  ?Monitor BP ? ?Chronic hyponatremia ?Seems stable ?On IVF ?Repeat bmet in AM ? ? ? ? ?  ? ?Subjective: Without complaints this AM. Reports voiding well ? ?Physical Exam: ?Vitals:  ? 03/20/21 0900 03/20/21 1000 03/20/21 1123 03/20/21 1301  ?BP: 134/76  116/65 104/69 116/82  ?Pulse: (!) 120 (!) 115 (!) 107 (!) 109  ?Resp: (!) 22 (!) 26 (!) 24 20  ?Temp:   97.8 ?F (36.6 ?C) 98.2 ?F (36.8 ?C)  ?TempSrc:   Oral Oral  ?SpO2: 96% 96% 98% 100%  ?Weight:      ?Height:      ? ?General exam: Awake, laying in bed, in nad ?Respiratory system: Normal respiratory effort, no wheezing ?Cardiovascular system: regular rate, s1, s2 ?Gastrointestinal system: Soft, nondistended, positive BS ?Central nervous system: CN2-12 grossly intact, strength intact ?Extremities: Perfused, no clubbing ?Skin: Normal skin turgor, no notable skin lesions seen ?Psychiatry: Mood normal // no visual hallucinations  ? ?Data Reviewed: ? ?Labs reviewed. Cr 4.39 ? ?Family Communication: Pt in room, family not at bedside ? ?Disposition: ?Status is: Inpatient ?Remains inpatient appropriate because: Severity of illness ? Planned Discharge Destination: Home ? ? ? ? ?Author: ?Marylu Lund, MD ?03/20/2021 3:09 PM ? ?For on call review [email or web address].  ?

## 2021-03-20 NOTE — Hospital Course (Signed)
88 y.o. male with medical history significant of HTN, OA, HLD, CKD 4 who presents after being called by his PCP to come to the emergency room due to abnormal lab work.  He reports he has had fatigue for the last few days and was going for his routine lab work with his PCP today.  States he has had decreased p.o. intake for the last 2 days due to his not feeling well.  He has not had any nausea, vomiting, diarrhea, fever.  He has no specific complaints and denies any chest pain, cough, shortness of breath, palpitations, confusion, urinary symptoms, muscle aches.  Lives at home with his wife.  No recent travel. ?No known sick contacts other than his wife has some mild nasal congestion. ?Was found to have AKI on repeat lab work with creatinine of 4.58 which is increased from his baseline of 2.4-2.5.  Hospitalist service was asked to admit for further management ?

## 2021-03-21 DIAGNOSIS — E871 Hypo-osmolality and hyponatremia: Secondary | ICD-10-CM

## 2021-03-21 LAB — COMPREHENSIVE METABOLIC PANEL
ALT: 82 U/L — ABNORMAL HIGH (ref 0–44)
AST: 42 U/L — ABNORMAL HIGH (ref 15–41)
Albumin: 2.3 g/dL — ABNORMAL LOW (ref 3.5–5.0)
Alkaline Phosphatase: 56 U/L (ref 38–126)
Anion gap: 10 (ref 5–15)
BUN: 91 mg/dL — ABNORMAL HIGH (ref 8–23)
CO2: 14 mmol/L — ABNORMAL LOW (ref 22–32)
Calcium: 7.8 mg/dL — ABNORMAL LOW (ref 8.9–10.3)
Chloride: 104 mmol/L (ref 98–111)
Creatinine, Ser: 4.9 mg/dL — ABNORMAL HIGH (ref 0.61–1.24)
GFR, Estimated: 10 mL/min — ABNORMAL LOW (ref >60)
Glucose, Bld: 104 mg/dL — ABNORMAL HIGH (ref 70–99)
Potassium: 5.1 mmol/L (ref 3.5–5.1)
Sodium: 128 mmol/L — ABNORMAL LOW (ref 135–145)
Total Bilirubin: 0.3 mg/dL (ref 0.3–1.2)
Total Protein: 5.7 g/dL — ABNORMAL LOW (ref 6.5–8.1)

## 2021-03-21 LAB — URINALYSIS, MICROSCOPIC (REFLEX): WBC, UA: >50 WBC/hpf (ref 0–5)

## 2021-03-21 LAB — CBC
HCT: 26.5 % — ABNORMAL LOW (ref 39.0–52.0)
Hemoglobin: 8.5 g/dL — ABNORMAL LOW (ref 13.0–17.0)
MCH: 30.6 pg (ref 26.0–34.0)
MCHC: 32.1 g/dL (ref 30.0–36.0)
MCV: 95.3 fL (ref 80.0–100.0)
Platelets: 292 10*3/uL (ref 150–400)
RBC: 2.78 MIL/uL — ABNORMAL LOW (ref 4.22–5.81)
RDW: 14.6 % (ref 11.5–15.5)
WBC: 10.5 10*3/uL (ref 4.0–10.5)
nRBC: 0 % (ref 0.0–0.2)

## 2021-03-21 LAB — URINALYSIS, ROUTINE W REFLEX MICROSCOPIC
Bilirubin Urine: NEGATIVE
Glucose, UA: NEGATIVE mg/dL
Ketones, ur: NEGATIVE mg/dL
Nitrite: NEGATIVE
Protein, ur: NEGATIVE mg/dL
Specific Gravity, Urine: 1.01 (ref 1.005–1.030)
pH: 7 (ref 5.0–8.0)

## 2021-03-21 MED ORDER — MELATONIN 5 MG PO TABS
5.0000 mg | ORAL_TABLET | Freq: Once | ORAL | Status: AC
  Administered 2021-03-21: 5 mg via ORAL
  Filled 2021-03-21: qty 1

## 2021-03-21 NOTE — Consult Note (Signed)
Urology Consult   Physician requesting consult: Marylu Lund, MD  Reason for consult: AKI on CKD, bilateral hydronephrosis  History of Present Illness:  Jesus Kim is a 88 year old male who is a patient of Dr. Matilde Sprang  He follows with a pain urethra with evidence stricture as well as a history of chronic bilateral hydronephrosis.  He was last seen in the office in 06/2020 with elevated PVRs around 380 mL.  He presented the ED on 03/19/2021 with decreased p.o. intake over the past 48 hours.  He was found to have AKI on CKD with creatinine initially 4.5.  His creatinine has increased to 4.9 on day of consultation.  Renal ultrasound 03/20/2021 demonstrated stable severe bilateral hydronephrosis and hydroureter unchanged from prior symptoms but he is not appear to be chronic in nature.  He did have a thick-walled bladder.  His postvoid residuals have been around 300.  Patient demonstrated that he is voiding with a reasonable stream.  He denies flank pain or abdominal pain.  He denies dysuria.  He denies gross hematuria.  Past Medical History:  Diagnosis Date   Arthritis    Cancer ([place])    Colon   Hyperlipidemia    Hypertension    Melanoma ([place])     Past Surgical History:  Procedure Laterality Date   BALLOON DILATION N/A 04/02/2015   Procedure: BALLOON DILATION;  Surgeon: Bjorn Loser, MD;  Location: [facility];  Service: Urology;  Laterality: N/A;   CATARACT EXTRACTION     COLON SURGERY     1981 - colon cancer   CYSTOSCOPY WITH RETROGRADE PYELOGRAM, URETEROSCOPY AND STENT PLACEMENT N/A 04/02/2015   Procedure: CYSTO WITH RETROGRADE ;  Surgeon: Bjorn Loser, MD;  Location: [facility];  Service: Urology;  Laterality: N/A;   Left thub surgery     TONSILLECTOMY     URETHRAL STRICTURE DILATATION      Current Hospital Medications:  Home Meds:  No current facility-administered medications on file prior to encounter.   Current Outpatient Medications on File Prior to Encounter  Medication  Sig Dispense Refill   acetaminophen (TYLENOL) 650 MG CR tablet Take 650 mg by mouth every 8 (eight) hours as needed for pain.     amLODipine (NORVASC) 2.5 MG tablet Take 2.5 mg by mouth 2 (two) times daily.     diphenhydramine-acetaminophen (TYLENOL PM) 25-500 MG TABS tablet Take 1 tablet by mouth at bedtime.     doxycycline (VIBRA-TABS) 100 MG tablet Take 200 mg by mouth daily as needed (uti).     finasteride (PROSCAR) 5 MG tablet Take 5 mg by mouth daily.     gabapentin (NEURONTIN) 100 MG capsule Take 2 capsules (200 mg total) by mouth at bedtime. 180 capsule 3   lisinopril (ZESTRIL) 20 MG tablet Take 20 mg by mouth daily.     Multiple Vitamins-Minerals (PRESERVISION AREDS 2+MULTI VIT PO) Take 2 tablets by mouth daily.     nitrofurantoin (MACRODANTIN) 100 MG capsule Take 100 mg by mouth daily.     simvastatin (ZOCOR) 20 MG tablet Take 20 mg by mouth daily.       Scheduled Meds:  amLODipine  2.5 mg Oral BID   enoxaparin (LOVENOX) injection  30 mg Subcutaneous Q24H   finasteride  5 mg Oral Daily   gabapentin  200 mg Oral QHS   multivitamin  1 tablet Oral Daily   simvastatin  20 mg Oral Daily   Continuous Infusions:  lactated ringers 100 mL/hr at 03/20/21 1259  PRN Meds:.acetaminophen **OR** acetaminophen, benzonatate, ondansetron **OR** ondansetron (ZOFRAN) IV, senna-docusate  Allergies:  Allergies  Allergen Reactions   Sulfa Antibiotics Rash    History reviewed. No pertinent family history.  Social History:  reports that he quit smoking about 44 years ago. His smoking use included cigarettes. He has never used smokeless tobacco. He reports current alcohol use of about 1.0 standard drink per week. He reports that he does not use drugs.  ROS: A complete review of systems was performed.  All systems are negative except for pertinent findings as noted.  Physical Exam:  Vital signs in last 24 hours: Temp:  [98.3 F (36.8 C)-98.6 F (37 C)] 98.3 F (36.8 C) (03/10  1227) Pulse Rate:  [99-110] 110 (03/10 1227) Resp:  [18-20] 20 (03/10 1227) BP: (110-111)/(53-67) 110/53 (03/10 1227) SpO2:  [91 %-100 %] 100 % (03/10 1227) Constitutional:  Alert and oriented, No acute distress Cardiovascular: Regular rate and rhythm Respiratory: Normal respiratory effort, Lungs clear bilaterally GI: Abdomen is soft, nontender, nondistended, no abdominal masses GU: No CVA tenderness Neurologic: Grossly intact, no focal deficits Psychiatric: Normal mood and affect  Laboratory Data:  Recent Labs    03/19/21 1737 03/20/21 0405 03/21/21 0435  WBC 9.8 10.7* 10.5  HGB 12.0* 11.4* 8.5*  HCT 37.8* 35.3* 26.5*  PLT 365 313 292    Recent Labs    03/19/21 1737 03/20/21 0405 03/21/21 0435  NA 130* 128* 128*  K 5.3* 4.4 5.1  CL 102 104 104  GLUCOSE 122* 107* 104*  BUN 69* 67* 91*  CALCIUM 8.6* 8.2* 7.8*  CREATININE 4.58* 4.39* 4.90*     Results for orders placed or performed during the hospital encounter of 03/19/21 (from the past 24 hour(s))  Comprehensive metabolic panel     Status: Abnormal   Collection Time: 03/21/21  4:35 AM  Result Value Ref Range   Sodium 128 (L) 135 - 145 mmol/L   Potassium 5.1 3.5 - 5.1 mmol/L   Chloride 104 98 - 111 mmol/L   CO2 14 (L) 22 - 32 mmol/L   Glucose, Bld 104 (H) 70 - 99 mg/dL   BUN 91 (H) 8 - 23 mg/dL   Creatinine, Ser 4.90 (H) 0.61 - 1.24 mg/dL   Calcium 7.8 (L) 8.9 - 10.3 mg/dL   Total Protein 5.7 (L) 6.5 - 8.1 g/dL   Albumin 2.3 (L) 3.5 - 5.0 g/dL   AST 42 (H) 15 - 41 U/L   ALT 82 (H) 0 - 44 U/L   Alkaline Phosphatase 56 38 - 126 U/L   Total Bilirubin 0.3 0.3 - 1.2 mg/dL   GFR, Estimated 10 (L) >60 mL/min   Anion gap 10 5 - 15  CBC     Status: Abnormal   Collection Time: 03/21/21  4:35 AM  Result Value Ref Range   WBC 10.5 4.0 - 10.5 K/uL   RBC 2.78 (L) 4.22 - 5.81 MIL/uL   Hemoglobin 8.5 (L) 13.0 - 17.0 g/dL   HCT 26.5 (L) 39.0 - 52.0 %   MCV 95.3 80.0 - 100.0 fL   MCH 30.6 26.0 - 34.0 pg   MCHC 32.1  30.0 - 36.0 g/dL   RDW 14.6 11.5 - 15.5 %   Platelets 292 150 - 400 K/uL   nRBC 0.0 0.0 - 0.2 %   Recent Results (from the past 240 hour(s))  Resp Panel by RT-PCR (Flu A&B, Covid) Nasopharyngeal Swab     Status: None   Collection Time: 03/19/21  6:30 PM   Specimen: Nasopharyngeal Swab; Nasopharyngeal(NP) swabs in vial transport medium  Result Value Ref Range Status   SARS Coronavirus 2 by RT PCR NEGATIVE NEGATIVE Final    Comment: (NOTE) SARS-CoV-2 target nucleic acids are NOT DETECTED.  The SARS-CoV-2 RNA is generally detectable in upper respiratory specimens during the acute phase of infection. The lowest concentration of SARS-CoV-2 viral copies this assay can detect is 138 copies/mL. A negative result does not preclude SARS-Cov-2 infection and should not be used as the sole basis for treatment or other patient management decisions. A negative result may occur with  improper specimen collection/handling, submission of specimen other than nasopharyngeal swab, presence of viral mutation(s) within the areas targeted by this assay, and inadequate number of viral copies(<138 copies/mL). A negative result must be combined with clinical observations, patient history, and epidemiological information. The expected result is Negative.  Fact Sheet for Patients:  EntrepreneurPulse.com.au  Fact Sheet for Healthcare Providers:  IncredibleEmployment.be  This test is no t yet approved or cleared by the [place] FDA and  has been authorized for detection and/or diagnosis of SARS-CoV-2 by FDA under an Emergency Use Authorization (EUA). This EUA will remain  in effect (meaning this test can be used) for the duration of the COVID-19 declaration under Section 564(b)(1) of the Act, 21 U.S.C.section 360bbb-3(b)(1), unless the authorization is terminated  or revoked sooner.       Influenza A by PCR NEGATIVE NEGATIVE Final   Influenza B by PCR NEGATIVE  NEGATIVE Final    Comment: (NOTE) The Xpert Xpress SARS-CoV-2/FLU/RSV plus assay is intended as an aid in the diagnosis of influenza from Nasopharyngeal swab specimens and should not be used as a sole basis for treatment. Nasal washings and aspirates are unacceptable for Xpert Xpress SARS-CoV-2/FLU/RSV testing.  Fact Sheet for Patients: EntrepreneurPulse.com.au  Fact Sheet for Healthcare Providers: IncredibleEmployment.be  This test is not yet approved or cleared by the [place] FDA and has been authorized for detection and/or diagnosis of SARS-CoV-2 by FDA under an Emergency Use Authorization (EUA). This EUA will remain in effect (meaning this test can be used) for the duration of the COVID-19 declaration under Section 564(b)(1) of the Act, 21 U.S.C. section 360bbb-3(b)(1), unless the authorization is terminated or revoked.  Performed at [facility], [place] [address], [place], [place] [address]     Renal Function: Recent Labs    03/19/21 1737 03/20/21 0405 03/21/21 0435  CREATININE 4.58* 4.39* 4.90*   Estimated Creatinine Clearance: 8.1 mL/min (A) (by C-G formula based on SCr of 4.9 mg/dL (H)).  Radiologic Imaging: CT HEAD WO CONTRAST (5MM)  Result Date: 03/19/2021 CLINICAL DATA:  Fall from toilet today hitting head on bathtub. EXAM: CT HEAD WITHOUT CONTRAST TECHNIQUE: Contiguous axial images were obtained from the base of the skull through the vertex without intravenous contrast. RADIATION DOSE REDUCTION: This exam was performed according to the departmental dose-optimization program which includes automated exposure control, adjustment of the mA and/or kV according to patient size and/or use of iterative reconstruction technique. COMPARISON:  None. FINDINGS: Brain: Normal for age brain atrophy. No intracranial hemorrhage, mass effect, or midline shift. No hydrocephalus. The basilar cisterns are patent. Minor  periventricular chronic small vessel ischemia. No evidence of territorial infarct or acute ischemia. No extra-axial or intracranial fluid collection. Vascular: Atherosclerosis of skullbase vasculature without hyperdense vessel or abnormal calcification. Skull: No fracture or focal lesion. Sinuses/Orbits: Paranasal sinuses and mastoid air cells are clear. The visualized orbits are unremarkable.  Bilateral lens extraction. Other: No confluent scalp hematoma. IMPRESSION: 1. No acute intracranial abnormality. No skull fracture. 2. Normal for age brain atrophy and chronic small vessel ischemia. Electronically Signed   By: Keith Rake M.D.   On: 03/19/2021 17:49   CT Cervical Spine Wo Contrast  Result Date: 03/19/2021 CLINICAL DATA:  Neck trauma, midline tenderness (Age 104-64y) Fall from toilet today hitting head on bathtub. EXAM: CT CERVICAL SPINE WITHOUT CONTRAST TECHNIQUE: Multidetector CT imaging of the cervical spine was performed without intravenous contrast. Multiplanar CT image reconstructions were also generated. RADIATION DOSE REDUCTION: This exam was performed according to the departmental dose-optimization program which includes automated exposure control, adjustment of the mA and/or kV according to patient size and/or use of iterative reconstruction technique. COMPARISON:  None. FINDINGS: Alignment: No traumatic subluxation. There is grade 1 degenerative anterolisthesis of C7 on T1 and T1 on T2. slight scoliotic curvature. Skull base and vertebrae: No acute fracture. Vertebral body heights are maintained. The dens and skull base are intact. Modic endplate changes at M7-E6. Soft tissues and spinal canal: No prevertebral fluid or swelling. No visible canal hematoma. Disc levels: Diffuse degenerative disc disease from C3-C4 through C6-C7. Prominent multilevel facet hypertrophy. Occasional areas of facet ankylosis typically degenerative. Upper chest: Carotid calcifications. Other: None. IMPRESSION:  Multilevel degenerative disc disease and facet hypertrophy throughout the cervical spine. No acute fracture or traumatic subluxation. Electronically Signed   By: Keith Rake M.D.   On: 03/19/2021 17:52   US RENAL  Result Date: 03/20/2021 CLINICAL DATA:  Acute renal failure EXAM: RENAL / URINARY TRACT ULTRASOUND COMPLETE COMPARISON:  CT abdomen pelvis 07/30/2020 FINDINGS: Right Kidney: Renal measurements: 11.9 x 6.2 x 6.1 cm = volume: 234 mL. Severe right hydronephrosis and hydroureter. 4.2 x 6 cm right lower pole cyst. Normal cortical echogenicity. Left Kidney: Renal measurements: 9.6 x 4.6 x 5.6 cm = volume: 130 mL. 2 cm left lower pole cyst. Severe left hydronephrosis. Normal renal cortical echogenicity. Bladder: Bladder wall thickening and small diverticula.  No mass lesion. Other: None. IMPRESSION: Severe bilateral hydronephrosis and hydroureter. This is unchanged from the prior CT and appears chronic. Thick-walled bladder may be due to neurogenic bladder or chronic bladder outlet obstruction. Electronically Signed   By: Franchot Gallo M.D.   On: 03/20/2021 08:54   DG Chest Portable 1 View  Result Date: 03/19/2021 CLINICAL DATA:  Fatigue and decreased appetite. EXAM: PORTABLE CHEST 1 VIEW COMPARISON:  December 20, 2019 FINDINGS: The heart size and mediastinal contours are within normal limits. There is mild calcification of the aortic arch. Both lungs are clear. The visualized skeletal structures are unremarkable. IMPRESSION: No active cardiopulmonary disease. Electronically Signed   By: Virgina Norfolk M.D.   On: 03/19/2021 19:34    I independently reviewed the above imaging studies.  Procedure note: Under sterile conditions, a Glidewire was placed through his meatus and curled within his bladder.  A 5 French opening catheter was placed over this wire and we confirmed presence in the bladder as urine was seen emanating from the catheter.  I then replaced a 0.038 sensor wire into the bladder.  I  did successfully dilated using Cook urethral dilators from 8 [place] to 54 [place].  I was able to make a council tip Foley catheter using a 16-gauge angiocatheter of the 14 French Foley catheter.  This was then placed into the bladder with return of cloudy appearing urine.  Impression/Recommendation  #1.  AKI on CKD: Creatinine 4.9 on day of consultation,  from a baseline of 2.5. #2.  Bilateral chronic hydronephrosis: Confirmed on renal ultrasound 03/20/2021  #3.  Pan urethral stricture  Given his history of panurethral stricture, chronic bilateral hydronephrosis and worsening AKI, along with elevated PVRs in the 300-400 range, I think he would benefit from Foley catheter placement.  Nurses were unable to place given the stricture. Under sterile conditions, a wire was placed and he was dilated from 8 [place] to 17 [place].  I was able to pass a 14 [place] Foley catheter into his bladder with return of cloudy appearing urine. I recommend leaving Foley catheter to gravity drainage until he follows up outpatient. I do think that he would likely benefit from a suprapubic tube that can be arranged in the outpatient setting as it will be difficult to exchange his Foley catheter monthly  Matt R. Kyree Adriano MD 03/21/2021, 4:56 PM  [facility]  Pager: [phone]

## 2021-03-21 NOTE — Progress Notes (Signed)
?  Progress Note ? ? ?Patient: Jesus Kim PVX:[id number] DOB: Jan 29, 1924 DOA: 03/19/2021     2 ?DOS: the patient was seen and examined on 03/21/2021 ?  ?Brief hospital course: ?88 y.o. male with medical history significant of HTN, OA, HLD, CKD 4 who presents after being called by his PCP to come to the emergency room due to abnormal lab work.  He reports he has had fatigue for the last few days and was going for his routine lab work with his PCP today.  States he has had decreased p.o. intake for the last 2 days due to his not feeling well.  He has not had any nausea, vomiting, diarrhea, fever.  He has no specific complaints and denies any chest pain, cough, shortness of breath, palpitations, confusion, urinary symptoms, muscle aches.  Lives at home with his wife.  No recent travel. ?No known sick contacts other than his wife has some mild nasal congestion. ?Was found to have AKI on repeat lab work with creatinine of 4.58 which is increased from his baseline of 2.4-2.5.  Hospitalist service was asked to admit for further management ? ?Assessment and Plan: ?* Acute kidney injury superimposed on CKD ([place]) ?Started on LR for IVF hydration.  ?Pt has been continued on IVF ?No significant improvement in renal function with Cr up to 4.9 ?Pt has known hx of urethral stricture with chronic bladder outlet obstruction and chronic B hydronephrosis ?Ordered and reviewed renal US with findings suggesting chronic B hydronephrosis  ?Pt reportedly voiding well ?Discussed with Urology who recommends trial of foley cath ? ?Hyperkalemia ?Given dose of Lokelma.  ?Potasium  Normalized ?Repeat bmet in AM ? ?Essential hypertension ?Continue home medications of norvasc.  ?Hold lisinopril with AKI.  ?Monitor BP ? ?Chronic hyponatremia ?Seems stable ?On IVF ?Repeat bmet in AM ? ? ? ? ?  ? ?Subjective: States feeling better today. Reports continuing to void ? ?Physical Exam: ?Vitals:  ? 03/20/21 2036 03/20/21 2040 03/21/21 0533 03/21/21 1227  ?BP:  111/67  (!) 111/53 (!) 110/53  ?Pulse:  (!) 110 99 (!) 110  ?Resp:  '20 18 20  '$ ?Temp:  98.6 ?F (37 ?C) 98.3 ?F (36.8 ?C) 98.3 ?F (36.8 ?C)  ?TempSrc:  Oral Oral Oral  ?SpO2:  91% 98% 100%  ?Weight:      ?Height:      ? ?General exam: Conversant, in no acute distress ?Respiratory system: normal chest rise, clear, no audible wheezing ?Cardiovascular system: regular rhythm, s1-s2 ?Gastrointestinal system: Nondistended, nontender, pos BS ?Central nervous system: No seizures, no tremors ?Extremities: No cyanosis, no joint deformities ?Skin: No rashes, no pallor ?Psychiatry: Affect normal // no auditory hallucinations  ? ?Data Reviewed: ? ?Labs reviewed. Cr 4.9 ? ?Family Communication: Pt in room, family not at bedside ? ?Disposition: ?Status is: Inpatient ?Remains inpatient appropriate because: Severity of illness ? Planned Discharge Destination: Home ? ? ? ? ?Author: ?Marylu Lund, MD ?03/21/2021 4:38 PM ? ?For on call review [email or web address]  ?

## 2021-03-22 LAB — CBC
HCT: 22.1 % — ABNORMAL LOW (ref 39.0–52.0)
Hemoglobin: 7.1 g/dL — ABNORMAL LOW (ref 13.0–17.0)
MCH: 30.9 pg (ref 26.0–34.0)
MCHC: 32.1 g/dL (ref 30.0–36.0)
MCV: 96.1 fL (ref 80.0–100.0)
Platelets: 256 10*3/uL (ref 150–400)
RBC: 2.3 MIL/uL — ABNORMAL LOW (ref 4.22–5.81)
RDW: 14.8 % (ref 11.5–15.5)
WBC: 13.6 10*3/uL — ABNORMAL HIGH (ref 4.0–10.5)
nRBC: 0 % (ref 0.0–0.2)

## 2021-03-22 LAB — COMPREHENSIVE METABOLIC PANEL
ALT: 142 U/L — ABNORMAL HIGH (ref 0–44)
AST: 99 U/L — ABNORMAL HIGH (ref 15–41)
Albumin: 2 g/dL — ABNORMAL LOW (ref 3.5–5.0)
Alkaline Phosphatase: 55 U/L (ref 38–126)
Anion gap: 9 (ref 5–15)
BUN: 104 mg/dL — ABNORMAL HIGH (ref 8–23)
CO2: 15 mmol/L — ABNORMAL LOW (ref 22–32)
Calcium: 7.6 mg/dL — ABNORMAL LOW (ref 8.9–10.3)
Chloride: 108 mmol/L (ref 98–111)
Creatinine, Ser: 4.5 mg/dL — ABNORMAL HIGH (ref 0.61–1.24)
GFR, Estimated: 11 mL/min — ABNORMAL LOW (ref >60)
Glucose, Bld: 118 mg/dL — ABNORMAL HIGH (ref 70–99)
Potassium: 4.9 mmol/L (ref 3.5–5.1)
Sodium: 132 mmol/L — ABNORMAL LOW (ref 135–145)
Total Bilirubin: 0.1 mg/dL — ABNORMAL LOW (ref 0.3–1.2)
Total Protein: 5 g/dL — ABNORMAL LOW (ref 6.5–8.1)

## 2021-03-22 MED ORDER — CHLORHEXIDINE GLUCONATE CLOTH 2 % EX PADS
6.0000 | MEDICATED_PAD | Freq: Every day | CUTANEOUS | Status: DC
  Administered 2021-03-22 – 2021-04-06 (×13): 6 via TOPICAL

## 2021-03-22 MED ORDER — MELATONIN 5 MG PO TABS
5.0000 mg | ORAL_TABLET | Freq: Once | ORAL | Status: AC
  Administered 2021-03-22: 5 mg via ORAL
  Filled 2021-03-22: qty 1

## 2021-03-22 NOTE — Plan of Care (Signed)

## 2021-03-22 NOTE — Evaluation (Signed)
Physical Therapy Evaluation ?Patient Details ?Name: Jesus Kim ?MRN: [id number] ?DOB: 12/28/1924 ?Today's Date: 03/22/2021 ? ?History of Present Illness ? 88 yo male admitted with AKI on CKD. Hx of urethral stricture, bilateral hydronephrosis, CKD.  ?Clinical Impression ? On eval, pt required Min A for mobility. He walked ~40 feet with a RW. Pt presents with general weakness, decreased activity tolerance, and impaired gait and balance. Pt c/o feeling really cold during session. He was very dyspneic with minimal activity. With ambulation, HR up to 140s-150s and O2 83% on RA (pt symptomatic). He was also wheezing during and after ambulation. Assisted pt back to bed. End of session: O2 91% on RA, HR 113 bpm. Made RN aware. Will plan to follow and progress activity as tolerated. At baseline, pt is mostly independent (uses cane PRN).    ?   ? ?Recommendations for follow up therapy are one component of a multi-disciplinary discharge planning process, led by the attending physician.  Recommendations may be updated based on patient status, additional functional criteria and insurance authorization. ? ?Follow Up Recommendations Home health PT ? ?  ?Assistance Recommended at Discharge Frequent or constant Supervision/Assistance  ?Patient can return home with the following ? A little help with walking and/or transfers;A little help with bathing/dressing/bathroom;Assistance with cooking/housework;Assist for transportation;Help with stairs or ramp for entrance ? ?  ?Equipment Recommendations None recommended by PT  ?Recommendations for Other Services ?    ?  ?Functional Status Assessment Patient has had a recent decline in their functional status and demonstrates the ability to make significant improvements in function in a reasonable and predictable amount of time.  ? ?  ?Precautions / Restrictions Precautions ?Precautions: Fall ?Precaution Comments: monitor HR and O2 ?Restrictions ?Weight Bearing Restrictions: No  ? ?   ? ?Mobility ? Bed Mobility ?Overal bed mobility: Needs Assistance ?Bed Mobility: Supine to Sit, Sit to Supine ?  ?  ?Supine to sit: Min guard, HOB elevated ?Sit to supine: Min guard, HOB elevated ?  ?General bed mobility comments: Min guard for safety, lines. Increased time. A bit effortful for pt. ?  ? ?Transfers ?Overall transfer level: Needs assistance ?Equipment used: Rolling walker (2 wheels) ?Transfers: Sit to/from Stand ?Sit to Stand: Min assist ?  ?  ?  ?  ?  ?General transfer comment: Assist to rise, steady, control descent. Cues for safety, hand placement. ?  ? ?Ambulation/Gait ?Ambulation/Gait assistance: Min assist ?Gait Distance (Feet): 40 Feet ?Assistive device: Rolling walker (2 wheels) ?Gait Pattern/deviations: Step-through pattern, Trunk flexed ?  ?  ?  ?General Gait Details: Assist to stabilize intermittently. Pt relied on walker for support. Brief standing rest break after 20 feet. Dyspnea 3/4 + wheezing. Fatigued. ? ?Stairs ?  ?  ?  ?  ?  ? ?Wheelchair Mobility ?  ? ?Modified Rankin (Stroke Patients Only) ?  ? ?  ? ?Balance Overall balance assessment: Needs assistance ?  ?  ?  ?  ?Standing balance support: Bilateral upper extremity supported, Reliant on assistive device for balance, During functional activity ?Standing balance-Leahy Scale: Poor ?  ?  ?  ?  ?  ?  ?  ?  ?  ?  ?  ?  ?   ? ? ? ?Pertinent Vitals/Pain Pain Assessment ?Pain Assessment: Faces ?Faces Pain Scale: Hurts little more ?Pain Location: catheter site ?Pain Descriptors / Indicators: Discomfort, Sore ?Pain Intervention(s): Limited activity within patient's tolerance, Monitored during session, Repositioned  ? ? ?Home Living Family/patient expects to be  discharged to:: Private residence ?Living Arrangements: Spouse/significant other ?Available Help at Discharge: Family ?Type of Home: Apartment ?Home Access: Level entry ?  ?  ?  ?Home Layout: One level ?Home Equipment: Conservation officer, nature (2 wheels);Cane - single point ?   ?  ?Prior  Function Prior Level of Function : Independent/Modified Independent ?  ?  ?  ?  ?  ?  ?Mobility Comments: normally ambulatory without a device. still drives ?  ?  ? ? ?Hand Dominance  ?   ? ?  ?Extremity/Trunk Assessment  ? Upper Extremity Assessment ?Upper Extremity Assessment: Generalized weakness ?  ? ?Lower Extremity Assessment ?Lower Extremity Assessment: Generalized weakness ?  ? ?Cervical / Trunk Assessment ?Cervical / Trunk Assessment: Kyphotic  ?Communication  ? Communication: HOH (wears hearing aids)  ?Cognition Arousal/Alertness: Awake/alert ?Behavior During Therapy: [facility] for tasks assessed/performed ?Overall Cognitive Status: Within Functional Limits for tasks assessed ?  ?  ?  ?  ?  ?  ?  ?  ?  ?  ?  ?  ?  ?  ?  ?  ?  ?  ?  ? ?  ?General Comments   ? ?  ?Exercises    ? ?Assessment/Plan  ?  ?PT Assessment Patient needs continued PT services  ?PT Problem List Decreased strength;Decreased mobility;Decreased activity tolerance;Decreased balance;Decreased knowledge of use of DME ? ?   ?  ?PT Treatment Interventions DME instruction;Balance training;Therapeutic exercise;Gait training;Functional mobility training;Therapeutic activities;Patient/family education   ? ?PT Goals (Current goals can be found in the Care Plan section)  ?Acute Rehab PT Goals ?Patient Stated Goal: to feel better and regain independence/PLOF ?PT Goal Formulation: With patient/family ?Time For Goal Achievement: 04/05/21 ?Potential to Achieve Goals: Good ? ?  ?Frequency Min 3X/week ?  ? ? ?Co-evaluation   ?  ?  ?  ?  ? ? ?  ?AM-PAC PT "6 Clicks" Mobility  ?Outcome Measure Help needed turning from your back to your side while in a flat bed without using bedrails?: A Little ?Help needed moving from lying on your back to sitting on the side of a flat bed without using bedrails?: A Little ?Help needed moving to and from a bed to a chair (including a wheelchair)?: A Little ?Help needed standing up from a chair using your arms (e.g., wheelchair or  bedside chair)?: A Little ?Help needed to walk in hospital room?: A Little ?Help needed climbing 3-5 steps with a railing? : A Little ?6 Click Score: 18 ? ?  ?End of Session Equipment Utilized During Treatment: Gait belt ?Activity Tolerance: Patient limited by fatigue ?Patient left: in bed;with call bell/phone within reach;with family/visitor present;with bed alarm set ?  ?PT Visit Diagnosis: Unsteadiness on feet (R26.81);Muscle weakness (generalized) (M62.81);Difficulty in walking, not elsewhere classified (R26.2) ?  ? ?Time: 4403-4742 ?PT Time Calculation (min) (ACUTE ONLY): 33 min ? ? ?Charges:   PT Evaluation ?$PT Eval Moderate Complexity: 1 Mod ?PT Treatments ?$Gait Training: 8-22 mins ?  ?   ? ? ? ?Doreatha Massed, PT ?Acute Rehabilitation  ?Office: [phone] ?Pager: [phone] ? ?  ? ?

## 2021-03-22 NOTE — Progress Notes (Signed)
?  Progress Note ? ? ?Patient: Jesus Kim WSF:[id number] DOB: 1924/02/07 DOA: 03/19/2021     3 ?DOS: the patient was seen and examined on 03/22/2021 ?  ?Brief hospital course: ?88 y.o. male with medical history significant of HTN, OA, HLD, CKD 4 who presents after being called by his PCP to come to the emergency room due to abnormal lab work.  He reports he has had fatigue for the last few days and was going for his routine lab work with his PCP today.  States he has had decreased p.o. intake for the last 2 days due to his not feeling well.  He has not had any nausea, vomiting, diarrhea, fever.  He has no specific complaints and denies any chest pain, cough, shortness of breath, palpitations, confusion, urinary symptoms, muscle aches.  Lives at home with his wife.  No recent travel. ?No known sick contacts other than his wife has some mild nasal congestion. ?Was found to have AKI on repeat lab work with creatinine of 4.58 which is increased from his baseline of 2.4-2.5.  Hospitalist service was asked to admit for further management ? ?Assessment and Plan: ?* Acute kidney injury superimposed on CKD ([place]) ?Started on LR for IVF hydration.  ?Pt has been continued on IVF ?Pt has known hx of urethral stricture with chronic bladder outlet obstruction and chronic B hydronephrosis ?Ordered and reviewed renal US with findings suggesting chronic B hydronephrosis  ?Appreciate assistance by Urology. Pt is now s/p foley placement, to remain in until f/u with Urology as outpt ?Cr down to 4.5. Repeat bmet in AM ? ?Hyperkalemia ?Given dose of Lokelma.  ?Potasium  Normalized ?Repeat bmet in AM ? ?Essential hypertension ?Continue home medications of norvasc.  ?Hold lisinopril with AKI.  ?Monitor BP ? ?Chronic hyponatremia ?Seems stable ?On IVF ?Repeat bmet in AM ? ? ? ? ?  ? ?Subjective: Reports feeling better today ? ?Physical Exam: ?Vitals:  ? 03/21/21 2119 03/22/21 0541 03/22/21 0941 03/22/21 1329  ?BP: (!) 102/49 (!) 111/50 (!)  107/51 (!) 105/41  ?Pulse: (!) 110 (!) 104  96  ?Resp: '17 17  20  '$ ?Temp: 98.3 ?F (36.8 ?C) 97.8 ?F (36.6 ?C)  99.4 ?F (37.4 ?C)  ?TempSrc: Oral   Oral  ?SpO2: 96% 100%  98%  ?Weight:      ?Height:      ? ?General exam: Awake, laying in bed, in nad ?Respiratory system: Normal respiratory effort, no wheezing ?Cardiovascular system: regular rate, s1, s2 ?Gastrointestinal system: Soft, nondistended, positive BS ?Central nervous system: CN2-12 grossly intact, strength intact ?Extremities: Perfused, no clubbing ?Skin: Normal skin turgor, no notable skin lesions seen ?Psychiatry: Mood normal // no visual hallucinations  ? ?Data Reviewed: ? ?Labs reviewed. Cr 4.5 ? ?Family Communication: Pt in room, family currently at bedside ? ?Disposition: ?Status is: Inpatient ?Remains inpatient appropriate because: Severity of illness ? Planned Discharge Destination: Home ? ? ? ? ?Author: ?Marylu Lund, MD ?03/22/2021 1:53 PM ? ?For on call review [email or web address].  ?

## 2021-03-23 ENCOUNTER — Inpatient Hospital Stay (HOSPITAL_COMMUNITY): Payer: PPO

## 2021-03-23 DIAGNOSIS — R578 Other shock: Secondary | ICD-10-CM | POA: Diagnosis present

## 2021-03-23 DIAGNOSIS — J9601 Acute respiratory failure with hypoxia: Secondary | ICD-10-CM | POA: Diagnosis present

## 2021-03-23 DIAGNOSIS — R579 Shock, unspecified: Secondary | ICD-10-CM

## 2021-03-23 DIAGNOSIS — D62 Acute posthemorrhagic anemia: Secondary | ICD-10-CM | POA: Diagnosis present

## 2021-03-23 DIAGNOSIS — J069 Acute upper respiratory infection, unspecified: Secondary | ICD-10-CM

## 2021-03-23 LAB — CBC
HCT: 15.4 % — ABNORMAL LOW (ref 39.0–52.0)
Hemoglobin: 4.9 g/dL — CL (ref 13.0–17.0)
MCH: 31.2 pg (ref 26.0–34.0)
MCHC: 31.8 g/dL (ref 30.0–36.0)
MCV: 98.1 fL (ref 80.0–100.0)
Platelets: 282 10*3/uL (ref 150–400)
RBC: 1.57 MIL/uL — ABNORMAL LOW (ref 4.22–5.81)
RDW: 15 % (ref 11.5–15.5)
WBC: 28.8 10*3/uL — ABNORMAL HIGH (ref 4.0–10.5)
nRBC: 0 % (ref 0.0–0.2)

## 2021-03-23 LAB — BLOOD GAS, ARTERIAL
Acid-base deficit: 8.6 mmol/L — ABNORMAL HIGH (ref 0.0–2.0)
Bicarbonate: 15 mmol/L — ABNORMAL LOW (ref 20.0–28.0)
Drawn by: 270211
FIO2: 44 %
O2 Saturation: 99.6 %
Patient temperature: 37
pCO2 arterial: 26 mmHg — ABNORMAL LOW (ref 32–48)
pH, Arterial: 7.37 (ref 7.35–7.45)
pO2, Arterial: 165 mmHg — ABNORMAL HIGH (ref 83–108)

## 2021-03-23 LAB — COMPREHENSIVE METABOLIC PANEL
ALT: 144 U/L — ABNORMAL HIGH (ref 0–44)
AST: 102 U/L — ABNORMAL HIGH (ref 15–41)
Albumin: 2 g/dL — ABNORMAL LOW (ref 3.5–5.0)
Alkaline Phosphatase: 51 U/L (ref 38–126)
Anion gap: 8 (ref 5–15)
BUN: 103 mg/dL — ABNORMAL HIGH (ref 8–23)
CO2: 14 mmol/L — ABNORMAL LOW (ref 22–32)
Calcium: 7.1 mg/dL — ABNORMAL LOW (ref 8.9–10.3)
Chloride: 108 mmol/L (ref 98–111)
Creatinine, Ser: 4.21 mg/dL — ABNORMAL HIGH (ref 0.61–1.24)
GFR, Estimated: 12 mL/min — ABNORMAL LOW (ref >60)
Glucose, Bld: 139 mg/dL — ABNORMAL HIGH (ref 70–99)
Potassium: 5.6 mmol/L — ABNORMAL HIGH (ref 3.5–5.1)
Sodium: 130 mmol/L — ABNORMAL LOW (ref 135–145)
Total Bilirubin: 0.2 mg/dL — ABNORMAL LOW (ref 0.3–1.2)
Total Protein: 4.8 g/dL — ABNORMAL LOW (ref 6.5–8.1)

## 2021-03-23 LAB — ECHOCARDIOGRAM COMPLETE
AR max vel: 1.69 cm2
AV Area VTI: 1.62 cm2
AV Area mean vel: 1.49 cm2
AV Mean grad: 9 mmHg
AV Peak grad: 15.4 mmHg
Ao pk vel: 1.97 m/s
Area-P 1/2: 3.12 cm2
Height: 66 in
S' Lateral: 2.4 cm
Weight: 2480 oz

## 2021-03-23 LAB — MRSA NEXT GEN BY PCR, NASAL: MRSA by PCR Next Gen: NOT DETECTED

## 2021-03-23 LAB — PROCALCITONIN: Procalcitonin: 0.88 ng/mL

## 2021-03-23 LAB — PREPARE RBC (CROSSMATCH)

## 2021-03-23 LAB — TROPONIN I (HIGH SENSITIVITY): Troponin I (High Sensitivity): 9 ng/L (ref <18)

## 2021-03-23 LAB — BASIC METABOLIC PANEL
Anion gap: 7 (ref 5–15)
BUN: 97 mg/dL — ABNORMAL HIGH (ref 8–23)
CO2: 15 mmol/L — ABNORMAL LOW (ref 22–32)
Calcium: 6.9 mg/dL — ABNORMAL LOW (ref 8.9–10.3)
Chloride: 112 mmol/L — ABNORMAL HIGH (ref 98–111)
Creatinine, Ser: 3.91 mg/dL — ABNORMAL HIGH (ref 0.61–1.24)
GFR, Estimated: 13 mL/min — ABNORMAL LOW (ref >60)
Glucose, Bld: 100 mg/dL — ABNORMAL HIGH (ref 70–99)
Potassium: 4.4 mmol/L (ref 3.5–5.1)
Sodium: 134 mmol/L — ABNORMAL LOW (ref 135–145)

## 2021-03-23 LAB — LACTIC ACID, PLASMA: Lactic Acid, Venous: 0.7 mmol/L (ref 0.5–1.9)

## 2021-03-23 LAB — HEMOGLOBIN AND HEMATOCRIT, BLOOD
HCT: 24 % — ABNORMAL LOW (ref 39.0–52.0)
HCT: 28.4 % — ABNORMAL LOW (ref 39.0–52.0)
Hemoglobin: 8.2 g/dL — ABNORMAL LOW (ref 13.0–17.0)
Hemoglobin: 9.3 g/dL — ABNORMAL LOW (ref 13.0–17.0)

## 2021-03-23 LAB — ABO/RH: ABO/RH(D): A POS

## 2021-03-23 LAB — PROTIME-INR
INR: 1.2 (ref 0.8–1.2)
Prothrombin Time: 15.1 seconds (ref 11.4–15.2)

## 2021-03-23 LAB — GLUCOSE, CAPILLARY
Glucose-Capillary: 128 mg/dL — ABNORMAL HIGH (ref 70–99)
Glucose-Capillary: 210 mg/dL — ABNORMAL HIGH (ref 70–99)

## 2021-03-23 MED ORDER — PHENYLEPHRINE HCL-NACL 20-0.9 MG/250ML-% IV SOLN
25.0000 ug/min | INTRAVENOUS | Status: DC
  Administered 2021-03-23: 25 ug/min via INTRAVENOUS
  Administered 2021-03-23: 65 ug/min via INTRAVENOUS
  Administered 2021-03-24: 63 ug/min via INTRAVENOUS
  Filled 2021-03-23 (×3): qty 250

## 2021-03-23 MED ORDER — TECHNETIUM TO 99M ALBUMIN AGGREGATED
4.4000 | Freq: Once | INTRAVENOUS | Status: AC
  Administered 2021-03-24: 4.4 via INTRAVENOUS

## 2021-03-23 MED ORDER — PANTOPRAZOLE SODIUM 40 MG IV SOLR
40.0000 mg | Freq: Two times a day (BID) | INTRAVENOUS | Status: DC
  Administered 2021-03-26 – 2021-03-29 (×6): 40 mg via INTRAVENOUS
  Filled 2021-03-23 (×6): qty 10

## 2021-03-23 MED ORDER — SODIUM BICARBONATE 8.4 % IV SOLN
50.0000 meq | Freq: Once | INTRAVENOUS | Status: AC
  Administered 2021-03-23: 50 meq via INTRAVENOUS
  Filled 2021-03-23: qty 50

## 2021-03-23 MED ORDER — INSULIN ASPART 100 UNIT/ML IJ SOLN
10.0000 [IU] | Freq: Once | INTRAMUSCULAR | Status: DC
Start: 2021-03-23 — End: 2021-03-23

## 2021-03-23 MED ORDER — SODIUM CHLORIDE 0.9 % IV SOLN: INTRAVENOUS | Status: DC

## 2021-03-23 MED ORDER — PANTOPRAZOLE SODIUM 40 MG IV SOLR
40.0000 mg | Freq: Once | INTRAVENOUS | Status: AC
  Administered 2021-03-23: 40 mg via INTRAVENOUS
  Filled 2021-03-23: qty 10

## 2021-03-23 MED ORDER — SODIUM CHLORIDE 0.9% IV SOLUTION: Freq: Once | INTRAVENOUS | Status: AC

## 2021-03-23 MED ORDER — PANTOPRAZOLE 80MG IVPB - SIMPLE MED
80.0000 mg | Freq: Once | INTRAVENOUS | Status: AC
  Administered 2021-03-23: 80 mg via INTRAVENOUS
  Filled 2021-03-23: qty 80

## 2021-03-23 MED ORDER — SODIUM CHLORIDE 0.9 % IV BOLUS
500.0000 mL | Freq: Once | INTRAVENOUS | Status: AC
  Administered 2021-03-23: 500 mL via INTRAVENOUS

## 2021-03-23 MED ORDER — PERFLUTREN LIPID MICROSPHERE
1.0000 mL | INTRAVENOUS | Status: AC | PRN
  Administered 2021-03-23: 2 mL via INTRAVENOUS
  Filled 2021-03-23: qty 10

## 2021-03-23 MED ORDER — ORAL CARE MOUTH RINSE
15.0000 mL | Freq: Two times a day (BID) | OROMUCOSAL | Status: DC
  Administered 2021-03-23 – 2021-03-26 (×6): 15 mL via OROMUCOSAL

## 2021-03-23 MED ORDER — SODIUM CHLORIDE 0.9 % IV SOLN
INTRAVENOUS | Status: AC
Start: 2021-03-23 — End: 2021-03-23

## 2021-03-23 MED ORDER — PANTOPRAZOLE INFUSION (NEW) - SIMPLE MED
8.0000 mg/h | INTRAVENOUS | Status: AC
Start: 2021-03-23 — End: 2021-03-26
  Administered 2021-03-23 – 2021-03-26 (×8): 8 mg/h via INTRAVENOUS
  Filled 2021-03-23: qty 80
  Filled 2021-03-23: qty 100
  Filled 2021-03-23 (×6): qty 80
  Filled 2021-03-23: qty 100

## 2021-03-23 MED ORDER — INSULIN ASPART 100 UNIT/ML IJ SOLN
10.0000 [IU] | Freq: Once | INTRAMUSCULAR | Status: AC
  Administered 2021-03-23: 10 [IU] via INTRAVENOUS

## 2021-03-23 MED ORDER — PHENYLEPHRINE HCL-NACL 20-0.9 MG/250ML-% IV SOLN: 0.0000 ug/min | INTRAVENOUS | Status: DC

## 2021-03-23 MED ORDER — SODIUM CHLORIDE 0.9 % IV SOLN
250.0000 mL | INTRAVENOUS | Status: DC
  Administered 2021-03-25: 250 mL via INTRAVENOUS

## 2021-03-23 MED ORDER — DEXTROSE 50 % IV SOLN
1.0000 | Freq: Once | INTRAVENOUS | Status: AC
  Administered 2021-03-23: 50 mL via INTRAVENOUS
  Filled 2021-03-23: qty 50

## 2021-03-23 NOTE — Progress Notes (Addendum)
0606 Pt BP 73/41, Increased lethargy, Rapid Reponse called and notified Charge RN and MD notified. First unit of PRBC transfusing ?

## 2021-03-23 NOTE — Progress Notes (Signed)
Rapid Response Event Note  ? ?Reason for Call : low b/p and lethargic ? ?Initial Focused Assessment: Pt responding to voice and following commands. Oriented at this time.  See flow sheet for VS.  Pt receiving 1/3 units of PRBC's.  However, pt is labored breathing and pale.  Pt has had a dark and light bloody stool prior to this episode. ? ?Interventions: Spoke with [facility], NP and pt already has orders to transfer to SD unit.  Consult to GI pending. IV Team at beside placing PIV's. CBG done. ? ?Plan of Care: Pt transferred to 1225 for closer monitoring. ? ?Event Summary:  ? ?MD Notified: yes ?Call Time: 0608 ?Arrival Time: 0615 ?End Time: 7290 ? ?Dyann Ruddle, RN ?

## 2021-03-23 NOTE — Progress Notes (Signed)
PT Cancellation Note ? ?Patient Details ?Name: Jesus Kim ?MRN: [id number] ?DOB: 04-Sep-1924 ? ? ?Cancelled Treatment:    Reason Eval/Treat Not Completed: Medical issues which prohibited therapy--xfer to ICU-multiple medical reasons. Will hold PT and check back another day. ? ?Doreatha Massed, PT ?Acute Rehabilitation  ?Office: [phone] ?Pager: [phone] ? ? ? ? ? ?, ?

## 2021-03-23 NOTE — Consult Note (Signed)
NAME:  Jesus Kim, MRN:  [id number], DOB:  24-Dec-1924, LOS: 4 ADMISSION DATE:  03/19/2021, CONSULTATION DATE:  31223 REFERRING MD:  Dr Rhetta Mura, CHIEF COMPLAINT:  Circulatory shock post melen and also 6L Hypocemic resp failure    History of Present Illness:  -72  functional male who uses a cane with remote history of colon cancer diagnosed in 1982 status post surgery but no recall on chemotherapy or radiation.  Last colonoscopy 2011 in [place].  At home denies any acid reflux, dysphagia or melena.  Takes NSAIDs for headaches and generalized pain.  He also has hypertension, osteoarthritis, hyperlipidemia, chronic kidney disease stage IV.  He is also known to have chronic bladder outlet obstruction with chronic hydronephrosis.  He presented on 03/19/2021 with fatigue for few days and decreased p.o. intake and generally not feeling well found to have AKI with creatinine of 4.58 mg percent [baseline 2.4-2.5 mg percent].  Dehydration was suspected along with ACE inhibitor.  Fluid hydration was started ACE inhibitor was held.  Course - 03/20/2021 normalized potassium.  Creatinine improved to 4.39 mg percent.  Renal ultrasound showed chronic B hydronephrosis.  Patient voiding well  -03/21/2021: Creatinine worsened to 4.9 mg percent.  Urology recommended Foley cath generally feeling better  -03/22/2021.:  Continued feeling better.  Creatinine down to 4.5 mg percent.  Has a Foley later in the evening had generalized weakness easy tachycardia desaturated with walking with PT to 83% and wheezing  -03/23/2021: Sudden bloody bowel movement along with melena and hypotension with expiratory wheezing associated with lethargy.  Hemoglobin drop to 4.9 g% [admit baseline around 8] moved to the ICU.  Status post 3 unit blood transfusion.  But persistent shock even without overt bleeding and CCM consulted.  GI consult concerned about possible cirrhosis [nodular liver seen on CT scan] versus gastritis from NSAIDs versus ulcer.   Most likely upper GI bleed suspect.  Protonix infusion started.  Full code affirmed also noticed to have 6 L hypoxemia requirement.  Early morning chest x-ray was clear.  Past Medical History:    has a past medical history of Arthritis, Cancer ([place]), Hyperlipidemia, Hypertension, and Melanoma ([place]).   reports that he quit smoking about 44 years ago. His smoking use included cigarettes. He has never used smokeless tobacco.  Past Surgical History:  Procedure Laterality Date   BALLOON DILATION N/A 04/02/2015   Procedure: BALLOON DILATION;  Surgeon: Bjorn Loser, MD;  Location: [facility];  Service: Urology;  Laterality: N/A;   CATARACT EXTRACTION     COLON SURGERY     1981 - colon cancer   CYSTOSCOPY WITH RETROGRADE PYELOGRAM, URETEROSCOPY AND STENT PLACEMENT N/A 04/02/2015   Procedure: CYSTO WITH RETROGRADE ;  Surgeon: Bjorn Loser, MD;  Location: [facility];  Service: Urology;  Laterality: N/A;   Left thub surgery     TONSILLECTOMY     URETHRAL STRICTURE DILATATION      Allergies  Allergen Reactions   Sulfa Antibiotics Rash    Immunization History  Administered Date(s) Administered   Influenza-Unspecified 11/13/2014   PFIZER(Purple Top)SARS-COV-2 Vaccination 02/17/2019, 03/14/2019    History reviewed. No pertinent family history.   Current Facility-Administered Medications:    0.9 %  sodium chloride infusion, 250 mL, Intravenous, Continuous, Halana Deisher, MD, Last Rate: 10 mL/hr at 03/23/21 1343, Rate Change at 03/23/21 1343   acetaminophen (TYLENOL) tablet 650 mg, 650 mg, Oral, Q6H PRN, 650 mg at 03/22/21 2057 **OR** acetaminophen (TYLENOL) suppository 650 mg, 650 mg, Rectal, Q6H PRN,  Chotiner, Yevonne Aline, MD   benzonatate (TESSALON) capsule 100 mg, 100 mg, Oral, TID PRN, Donne Hazel, MD   Chlorhexidine Gluconate Cloth 2 % PADS 6 each, 6 each, Topical, Daily, Donne Hazel, MD, 6 each at 03/23/21 0748   finasteride (PROSCAR) tablet 5 mg, 5 mg, Oral, Daily, Chotiner,  Yevonne Aline, MD, 5 mg at 03/22/21 3875   gabapentin (NEURONTIN) capsule 200 mg, 200 mg, Oral, QHS, Chotiner, Yevonne Aline, MD, 200 mg at 03/22/21 2100   MEDLINE mouth rinse, 15 mL, Mouth Rinse, BID, Donne Hazel, MD, 15 mL at 03/23/21 1052   multivitamin (PROSIGHT) tablet 1 tablet, 1 tablet, Oral, Daily, Chotiner, Yevonne Aline, MD, 1 tablet at 03/22/21 0941   ondansetron (ZOFRAN) tablet 4 mg, 4 mg, Oral, Q6H PRN **OR** ondansetron (ZOFRAN) injection 4 mg, 4 mg, Intravenous, Q6H PRN, Chotiner, Yevonne Aline, MD   [START ON 03/26/2021] pantoprazole (PROTONIX) injection 40 mg, 40 mg, Intravenous, Q12H, Blount, Xenia T, NP   pantoprozole (PROTONIX) 80 mg /NS 100 mL infusion, 8 mg/hr, Intravenous, Continuous, Blount, Xenia T, NP, Last Rate: 10 mL/hr at 03/23/21 1342, 8 mg/hr at 03/23/21 1342   phenylephrine (NEO-SYNEPHRINE) '20mg'$ /NS 269m premix infusion, 25-200 mcg/min, Intravenous, Titrated, Alejandra Barna, MD   senna-docusate (Senokot-S) tablet 1 tablet, 1 tablet, Oral, QHS PRN, Chotiner, BYevonne Aline MD   simvastatin (ZOCOR) tablet 20 mg, 20 mg, Oral, Daily, Chotiner, BYevonne Aline MD, 20 mg at 03/22/21 [facility]  [facility] - admit 03/23/21 - ccm consult   Interim History / Subjective:   03/23/2021 - seen in bed 1225 icu  Objective   Blood pressure (!) 83/41, pulse 83, temperature 98.3 F (36.8 C), temperature source Axillary, resp. rate 16, height '5\' 6"'$  (1.676 m), weight 70.3 kg, SpO2 100 %.        Intake/Output Summary (Last 24 hours) at 03/23/2021 1343 Last data filed at 03/23/2021 1311 Gross per 24 hour  Intake 3289.97 ml  Output 1600 ml  Net 1689.97 ml   Filed Weights   03/19/21 1648  Weight: 70.3 kg    Examination: General: Elderly male looks slightly younger than age.  Resting comfortably HENT: Oxygen on.  No neck nodes no elevated JVP Lungs: No distress clear to auscultation Cardiovascular: Hypotensive normal heart sounds Abdomen: Soft nontender no  organomegaly Extremities: No sinus no clubbing no edema Neuro: Sleepy but easily arousable GU: Not examined  Resolved Hospital Problem list   X  Assessment & Plan:   [place] nitrofurantoin intake for renal issues [puts him at risk for ILD Acute hypoxic respiratory failure -onset late 03/18/2021 versus 03/23/2021.  Requiring 6 L nasal oxygen  03/23/2021 -> no increased work of breathing but requiring 6 L nasal cannula  P:   Check ABG Initiate pulmonary embolism work-up - Check VQ scan if stable (no CTA due to renal disease) - Check stat echo to rule out pulm embolism - IF PE - cannot anticoagulate or even get CTA - . Might need fitler Initiate work-up for pulm infiltrates  -Check chest x-ray and depending on results consider high-resolution CT chest  BAseline gabapentin intake - > ? Why Mild acute encephalopathy associated with hypotension/likely shock secondary to hemorrhage  - 03/23/2021 easily arousable  P:   Maintain hemodynamics Monitor     Circulatory shock secondary to hemorrhage secondary to GI bleed  - 03/23/2021: MAP less than 60  P:  Start Neo-Synephrine through peripheral lin   At risk fo rMI  P: Check trop     No evidence of sepsis [no fever] but has sudden jump in white count 03/18/2021.?  Stress reaction from GI bleed    P:   Check procalcitonin Check lactic acid    Chronic kidney disease with baseline creatinine around 2.6 mg percent as of 2021 Associated chronic bladder outlet obstruction with B hydronephrosis  -Status post Foley 03/21/2021 with improvement in creatinine.  Urology consult plus  03/23/2021 -continued improvement in creatinine  P:  Needs chronic Foley and to be followed as outpatient    Mild hyperkalemia at admit secondary to renal failure Recurrence of hyperkalemia 04/10/2021  P: Correction by hospitalist   :   History of colon cancer in the remote past in [place] Concern for nodular cirrhosis on admission CT  scan per GI History of NSAID intake at home with post admission massive GI bleed 03/23/2021  -Awaiting endoscopy 04/10/2021 by Dr. Therisa Doyne  03/23/2021 -on Protonix infusion  P:   GI following Protonix infusion Heading towards endoscopy   Chronic anemia -baseline hemoglobin 12 g% -prior to admission [place] very likely upper GI bleed on 03/23/2021 [possible onset 03/21/2021] -status post 3 units of PRBC   P:  - PRBC for hgb </= 6.9gm%    - exceptions are   -  if ACS susepcted/confirmed then transfuse for hgb </= 8.0gm%,  or    -  active bleeding with hemodynamic instability, then transfuse regardless of hemoglobin value   At at all times try to transfuse 1 unit prbc as possible with exception of active hemorrhage    MSK/DERM Ambulatory baseline with a cane Mild protein calorie malnutrition at baseline: Albumin 2.8 g% in 2021  Plan  - monitor    Best practice (daily eval):  Diet: npo Pain/Anxiety/Delirium protocol (if indicated): x VAP protocol (if indicated): x DVT prophylaxis: bleeding - GI prophylaxis: ppi gtt Glucose control: per [facility] Mobility: bed rest Code Status: ful code [according to the hospital his wife is critically ill and he will be DNR if his wife passes away before him] Family Communication: According to the hospitalist Disposition: ICU     ATTESTATION & SIGNATURE   The patient Jesus Kim is critically ill with multiple organ systems failure and requires high complexity decision making for assessment and support, frequent evaluation and titration of therapies, application of advanced monitoring technologies and extensive interpretation of multiple databases.   Critical Care Time devoted to patient care services described in this note is  60  Minutes. This time reflects time of care of this signee Dr Brand Males. This critical care time does not reflect procedure time, or teaching time or supervisory time of PA/NP/Med student/Med Resident etc but  could involve care discussion time      SIGNATURE    Dr. Brand Males, M.D., F.C.C.P,  Pulmonary and Critical Care Medicine Staff Physician, [place] Director - Interstitial Lung Disease  Program  Pulmonary [place] [facility] [place], [place], [address]  NPI Number:  NPI #[id number]  Pager: [phone], If no answer  -> Check AMION or Try [phone] Telephone (clinical office): [phone] Telephone (research): [phone]  1:43 PM 03/23/2021   03/23/2021 1:43 PM    LABS    PULMONARY No results for input(s): PHART, PCO2ART, PO2ART, HCO3, TCO2, O2SAT in the last 168 hours.  Invalid input(s): PCO2, PO2  CBC Recent Labs  Lab 03/21/21 0435 03/22/21 0523 03/23/21 0448  HGB 8.5* 7.1* 4.9*  HCT 26.5* 22.1* 15.4*  WBC 10.5 13.6* 28.8*  PLT 292 256 282    COAGULATION Recent Labs  Lab 03/19/21 1737  INR 1.2    CARDIAC  No results for input(s): TROPONINI in the last 168 hours. No results for input(s): PROBNP in the last 168 hours.  CHEMISTRY Recent Labs  Lab 03/19/21 1737 03/20/21 0405 03/21/21 0435 03/22/21 0523 03/23/21 0448  NA 130* 128* 128* 132* 130*  K 5.3* 4.4 5.1 4.9 5.6*  CL 102 104 104 108 108  CO2 16* 15* 14* 15* 14*  GLUCOSE 122* 107* 104* 118* 139*  BUN 69* 67* 91* 104* 103*  CREATININE 4.58* 4.39* 4.90* 4.50* 4.21*  CALCIUM 8.6* 8.2* 7.8* 7.6* 7.1*   Estimated Creatinine Clearance: 9.5 mL/min (A) (by C-G formula based on SCr of 4.21 mg/dL (H)).   LIVER Recent Labs  Lab 03/19/21 1737 03/21/21 0435 03/22/21 0523 03/23/21 0448  AST 107* 42* 99* 102*  ALT 146* 82* 142* 144*  ALKPHOS 77 56 55 51  BILITOT 0.5 0.3 0.1* 0.2*  PROT 7.7 5.7* 5.0* 4.8*  ALBUMIN 3.4* 2.3* 2.0* 2.0*  INR 1.2  --   --   --      INFECTIOUS No results for input(s): LATICACIDVEN, PROCALCITON in the last 168 hours.   ENDOCRINE CBG (last 3)  Recent Labs    03/23/21 0635  03/23/21 1209  GLUCAP 128* 210*         IMAGING x48h  - image(s) personally visualized  -   highlighted in bold DG Chest 1 View  Result Date: 03/23/2021 CLINICAL DATA:  Possible GI bleeding EXAM: CHEST  1 VIEW COMPARISON:  03/19/2021 FINDINGS: Artifact from EKG leads. Normal heart size and stable mediastinal contours. No acute infiltrate or edema. No effusion or pneumothorax. No acute osseous findings. IMPRESSION: No active disease. Electronically Signed   By: Jorje Guild M.D.   On: 03/23/2021 07:02

## 2021-03-23 NOTE — Assessment & Plan Note (Signed)
Overnight events noted. Large dark bloody BM overnight with subsequent hypotension Hgb down to 4.9, now s/p 3 units PRBC's Seen by GI, recs for endoscopy 3/12. Cont to monitor. If recurrent bleed, then would obtain STAT CT angio  Cont follow CBC trends

## 2021-03-23 NOTE — Treatment Plan (Signed)
Patient seen and overnight events noted. Full note will follow. ?Briefly, pt had acute episode of significant GI bleed overnight, currently undergoing 3 units PRBC's. Pt seen by GI with recs for endo tomorrow. Despite PRBC transfusion, BP remains low with sbp in the 70's. At present, pt is lethargic. Have met with family at bedside and discussed code status. Per family, pt's wife is also in critical condition in hospital. If pt's wife is alive, then pt's wishes are Full Code and if pt's wife passes, then he would like to be DNR. Will discuss case with critical care. ?

## 2021-03-23 NOTE — Progress Notes (Signed)
An USGPIV (ultrasound guided PIV) has been placed for short-term vasopressor infusion. A correctly placed ivWatch must be used when administering Vasopressors. Should this treatment be needed beyond 72 hours, central line access should be obtained.  It will be the responsibility of the bedside nurse to follow best practice to prevent extravasations.   ?

## 2021-03-23 NOTE — Progress Notes (Signed)
Rapid response called on pt for hypotension with SBP 60/70?s and SOB. Blood currently transfusing. Pt to be transferred to SDU for closer monitoring. Oncall GI paged and consulted. Pt to be seen by [place] this am. Continue with MIVF, blood transfusion.  ? ?Jesus Corpus, NP ?[facility] 7p-7a  ?[phone]

## 2021-03-23 NOTE — Consult Note (Addendum)
[facility] Consult  Referring Provider: [facility] Primary Care Physician:  [name], MD Primary Gastroenterologist: [facility] Primary  Reason for Consultation:  Melena/hematochezia  HPI: Jesus Kim is a 88 y.o. male was admitted on 03/19/2021 with abnormal labs, fatigue, decreased p.o. intake and found to have acute kidney injury with a creatinine of 4.58.  Today morning at 430 patient had a large black tarry bowel movement which turned bloody, he was subsequently noted to be hypotensive and tachycardic and was transferred to ICU as his hemoglobin dropped from 7.1-4.9.  Patient has a remote history of colon cancer diagnosed in 1982, for which she had colon surgery, does not recall chemotherapy or radiation. Last colonoscopy was performed in 2011 in [place] which showed internal hemorrhoids, otherwise unremarkable, repeat colonoscopy is not recommended due to age.  At home patient denies acid reflux or heartburn. He denies difficulty swallowing or pain on swallowing, denies abdominal or rectal pain, denies prior episode of black stool or bloody bowel movement. He takes Advil as needed for headaches and generalized pain. He is otherwise not on any blood thinners.  Patient does not recall having a prior endoscopy.  Past Medical History:  Diagnosis Date   Arthritis    Cancer ([place])    Colon   Hyperlipidemia    Hypertension    Melanoma ([place])     Past Surgical History:  Procedure Laterality Date   BALLOON DILATION N/A 04/02/2015   Procedure: BALLOON DILATION;  Surgeon: Bjorn Loser, MD;  Location: [facility];  Service: Urology;  Laterality: N/A;   CATARACT EXTRACTION     COLON SURGERY     1981 - colon cancer   CYSTOSCOPY WITH RETROGRADE PYELOGRAM, URETEROSCOPY AND STENT PLACEMENT N/A 04/02/2015   Procedure: CYSTO WITH RETROGRADE ;  Surgeon: Bjorn Loser, MD;  Location: [facility];  Service: Urology;  Laterality: N/A;   Left thub surgery     TONSILLECTOMY      URETHRAL STRICTURE DILATATION      Prior to Admission medications   Medication Sig Start Date End Date Taking? Authorizing Provider  acetaminophen (TYLENOL) 650 MG CR tablet Take 650 mg by mouth every 8 (eight) hours as needed for pain.   Yes [provider]  amLODipine (NORVASC) 2.5 MG tablet Take 2.5 mg by mouth 2 (two) times daily.   Yes [provider]  diphenhydramine-acetaminophen (TYLENOL PM) 25-500 MG TABS tablet Take 1 tablet by mouth at bedtime.   Yes [provider]  doxycycline (VIBRA-TABS) 100 MG tablet Take 200 mg by mouth daily as needed (uti).   Yes [provider]  finasteride (PROSCAR) 5 MG tablet Take 5 mg by mouth daily.   Yes [provider]  gabapentin (NEURONTIN) 100 MG capsule Take 2 capsules (200 mg total) by mouth at bedtime. 10/02/20  Yes Gregor Hams, MD  lisinopril (ZESTRIL) 20 MG tablet Take 20 mg by mouth daily.   Yes [provider]  Multiple Vitamins-Minerals (PRESERVISION AREDS 2+MULTI VIT PO) Take 2 tablets by mouth daily.   Yes [provider]  nitrofurantoin (MACRODANTIN) 100 MG capsule Take 100 mg by mouth daily.   Yes [provider]  simvastatin (ZOCOR) 20 MG tablet Take 20 mg by mouth daily.   Yes [provider]    Current Facility-Administered Medications  Medication Dose Route Frequency Provider Last Rate Last Admin   0.9 %  sodium chloride infusion (Manually program via Guardrails IV Fluids)   Intravenous Once Neila Gear, NP  0.9 %  sodium chloride infusion   Intravenous Continuous Blount, Scarlette Shorts T, NP 100 mL/hr at 03/23/21 0630 New Bag at 03/23/21 0630   acetaminophen (TYLENOL) tablet 650 mg  650 mg Oral Q6H PRN Chotiner, Yevonne Aline, MD   650 mg at 03/22/21 2057   Or   acetaminophen (TYLENOL) suppository 650 mg  650 mg Rectal Q6H PRN Chotiner, Yevonne Aline, MD       amLODipine (NORVASC) tablet 2.5 mg  2.5 mg Oral BID Chotiner, Yevonne Aline, MD   2.5 mg at 03/22/21  2100   benzonatate (TESSALON) capsule 100 mg  100 mg Oral TID PRN Donne Hazel, MD       Chlorhexidine Gluconate Cloth 2 % PADS 6 each  6 each Topical Daily Donne Hazel, MD   6 each at 03/23/21 0748   finasteride (PROSCAR) tablet 5 mg  5 mg Oral Daily Chotiner, Yevonne Aline, MD   5 mg at 03/22/21 3762   gabapentin (NEURONTIN) capsule 200 mg  200 mg Oral QHS Chotiner, Yevonne Aline, MD   200 mg at 03/22/21 2100   multivitamin (PROSIGHT) tablet 1 tablet  1 tablet Oral Daily Chotiner, Yevonne Aline, MD   1 tablet at 03/22/21 0941   ondansetron (ZOFRAN) tablet 4 mg  4 mg Oral Q6H PRN Chotiner, Yevonne Aline, MD       Or   ondansetron (ZOFRAN) injection 4 mg  4 mg Intravenous Q6H PRN Chotiner, Yevonne Aline, MD       [START ON 03/26/2021] pantoprazole (PROTONIX) injection 40 mg  40 mg Intravenous Q12H Blount, Xenia T, NP       pantoprozole (PROTONIX) 80 mg /NS 100 mL infusion  8 mg/hr Intravenous Continuous Lovey Newcomer T, NP 10 mL/hr at 03/23/21 0737 8 mg/hr at 03/23/21 0737   senna-docusate (Senokot-S) tablet 1 tablet  1 tablet Oral QHS PRN Chotiner, Yevonne Aline, MD       simvastatin (ZOCOR) tablet 20 mg  20 mg Oral Daily Chotiner, Yevonne Aline, MD   20 mg at 03/22/21 0941    Allergies as of 03/19/2021 - Review Complete 03/19/2021  Allergen Reaction Noted   Sulfa antibiotics Rash 02/27/2015    History reviewed. No pertinent family history.  Social History   Socioeconomic History   Marital status: Married    Spouse name: Not on file   Number of children: 3   Years of education: 14   Highest education level: Not on file  Occupational History   Not on file  Tobacco Use   Smoking status: Former    Types: Cigarettes    Quit date: 01/31/1977    Years since quitting: 44.1   Smokeless tobacco: Never  Vaping Use   Vaping Use: Never used  Substance and Sexual Activity   Alcohol use: Yes    Alcohol/week: 1.0 standard drink    Types: 1 Glasses of wine per week    Comment: occasionally   Drug use: No    Sexual activity: Not on file  Other Topics Concern   Not on file  Social History Narrative   Not on file   Social Determinants of Health   Financial Resource Strain: Not on file  Food Insecurity: Not on file  Transportation Needs: Not on file  Physical Activity: Not on file  Stress: Not on file  Social Connections: Not on file  Intimate Partner Violence: Not on file    Review of Systems: As per HPI  Physical Exam: Vital signs in last  24 hours: Temp:  [97.6 F (36.4 C)-99.4 F (37.4 C)] 98.7 F (37.1 C) (03/12 0604) Pulse Rate:  [67-110] 97 (03/12 0745) Resp:  [20-28] 20 (03/12 0745) BP: (90-111)/(29-51) 106/44 (03/12 0745) SpO2:  [81 %-100 %] 100 % (03/12 0745)    General: Elderly, on oxygen via nasal cannula, pale Head:  Normocephalic and atraumatic. Eyes:  Sclera clear, no icterus.   Prominent pallor Ears:  Normal auditory acuity. Nose:  No deformity, discharge,  or lesions. Mouth: Dry mucous membranes Neck:  Supple; no masses or thyromegaly. Lungs:  Clear throughout to auscultation.   No wheezes, crackles, or rhonchi. No acute distress. Heart: Slightly tachycardic Extremities:  Without clubbing or edema. Neurologic:  Alert and  oriented x4;  grossly normal neurologically. Skin:  Intact without significant lesions or rashes. Psych:  Alert and cooperative. Normal mood and affect. Abdomen:  Soft, nontender and nondistended. No masses, hepatosplenomegaly or hernias noted. Normal bowel sounds, without guarding, and without rebound.         Lab Results: Recent Labs    03/21/21 0435 03/22/21 0523 03/23/21 0448  WBC 10.5 13.6* 28.8*  HGB 8.5* 7.1* 4.9*  HCT 26.5* 22.1* 15.4*  PLT 292 256 282   BMET Recent Labs    03/21/21 0435 03/22/21 0523 03/23/21 0448  NA 128* 132* 130*  K 5.1 4.9 5.6*  CL 104 108 108  CO2 14* 15* 14*  GLUCOSE 104* 118* 139*  BUN 91* 104* 103*  CREATININE 4.90* 4.50* 4.21*  CALCIUM 7.8* 7.6* 7.1*   LFT Recent Labs     03/23/21 0448  PROT 4.8*  ALBUMIN 2.0*  AST 102*  ALT 144*  ALKPHOS 51  BILITOT 0.2*   PT/INR No results for input(s): LABPROT, INR in the last 72 hours.  Studies/Results: DG Chest 1 View  Result Date: 03/23/2021 CLINICAL DATA:  Possible GI bleeding EXAM: CHEST  1 VIEW COMPARISON:  03/19/2021 FINDINGS: Artifact from EKG leads. Normal heart size and stable mediastinal contours. No acute infiltrate or edema. No effusion or pneumothorax. No acute osseous findings. IMPRESSION: No active disease. Electronically Signed   By: Jorje Guild M.D.   On: 03/23/2021 07:02    Impression: Melena/hematochezia Acute blood loss anemia, hemoglobin 4.9 today with hemodynamic instability Blood pressure was 90/38 with a heart rate of 101 at 430 and currently is 106/44 with a heart rate of 97 Currently on oxygen 6 L via nasal cannula Receiving first unit of PRBC transfusion, a total of 3 units PRBC transfusion has been ordered  Sodium 130, potassium 5.6, acidotic, bicarb 14 Renal impairment, BUN 103/creatinine 4.21/GFR 20  Abnormal LFTs, T. bili 0.2/AST 102/ALT 144/ALP 51-ultrasound from 11/21 showed slightly nodular liver raising concern of early cirrhosis, patent portal vein with normal directional flow Leukocytosis, WBC 28.8 Normal PT/INR, normal platelet  CT abdomen without contrast from 07/30/2020-bilateral hydronephrosis, bladder outlet obstruction, otherwise normal stomach, small bowel and colon  Plan: At this point, transfusing PRBC and improving hemodynamics is most important, especially considering his age-52 years.  Patient to receive a total of 3 units PRBC transfusion.  Patient has been started on IV Protonix drip.  We will keep patient n.p.o.  Depending upon hemodynamics, likely EGD tomorrow in a.m..  This is possibly an upper GI bleed as there is history of NSAID use, however lower GI bleed cannot be excluded.  If patient continues to have hemodynamic instability despite PRBC  transfusion, recommend CT angiogram of abdomen to further characterize location of bleeding and possible embolization  if needed.  I tried reaching his daughter Shari Heritage at [phone] and his wife PHILBERT OCALLAGHAN at [phone], however was not able to reach them and I have left voice messages for both of them.    LOS: 4 days   Ronnette Juniper, MD  03/23/2021, 7:55 AM

## 2021-03-23 NOTE — Progress Notes (Signed)
Received [facility] consult for PIV start for Vasopressor. Assessed bilateral forearms with ultrasound, no suitable veins found. Patient currently has 3 working PIV's. Primary RN Kayla aware. ?

## 2021-03-23 NOTE — Progress Notes (Signed)
?Progress Note ? ? ?Patient: Jesus SERVANTES OLM:[id number] DOB: [birth year] DOA: 03/19/2021     4 ?DOS: the patient was seen and examined on 03/23/2021 ?  ?Brief hospital course: ?88 y.o. male with medical history significant of HTN, OA, HLD, CKD 4 who presents after being called by his PCP to come to the emergency room due to abnormal lab work.  He reports he has had fatigue for the last few days and was going for his routine lab work with his PCP today.  States he has had decreased p.o. intake for the last 2 days due to his not feeling well.  He has not had any nausea, vomiting, diarrhea, fever.  He has no specific complaints and denies any chest pain, cough, shortness of breath, palpitations, confusion, urinary symptoms, muscle aches.  Lives at home with his wife.  No recent travel. ?No known sick contacts other than his wife has some mild nasal congestion. ?Was found to have AKI on repeat lab work with creatinine of 4.58 which is increased from his baseline of 2.4-2.5.  Hospitalist service was asked to admit for further management ? ?Assessment and Plan: ?* Acute kidney injury superimposed on CKD ([place]) ?Started on LR for IVF hydration.  ?Pt has been continued on IVF ?Pt has known hx of urethral stricture with chronic bladder outlet obstruction and chronic B hydronephrosis ?Ordered and reviewed renal US with findings suggesting chronic B hydronephrosis  ?Appreciate assistance by Urology. Pt is now s/p foley placement, to remain in until f/u with Urology as outpt ?Cr down to 4.2 today ? ?Hyperkalemia ?Likely secondary to renal failure ?K of 5.6 today. See below, pt NPO ?Will give amp of bicarb, IV insulin with D50 ?Would recheck bmet in AM ? ?Essential hypertension ?Had been continued on norvasc, now on hold ?Hold lisinopril with AKI.  ?Now off bp meds secondary hypotension ? ?Chronic hyponatremia ?On IVF ?Recheck bmet in am ? ?Acute hypoxemic respiratory failure ([place]) ?Baseline on room air ?CXR reviewed, clear ?Now on  [facility] ?Discussed with PCCM, have ordered STAT echo and LE dopplers ? ?Hemorrhagic shock ([place]) ?SBP into the 70's in setting of large GI bleed this AM ?Appreciate input by PCCM. Now on pressors ? ?Acute blood loss anemia ?Overnight events noted. Large dark bloody BM overnight with subsequent hypotension ?Hgb down to 4.9, now s/p 3 units PRBC's ?Seen by GI, recs for endoscopy 3/12. Cont to monitor. If recurrent bleed, then would obtain STAT CT angio  ?Cont follow CBC trends ? ? ? ? ?  ? ?Subjective: Overnight events noted. Currently pt tired, denies abd pain ? ?Physical Exam: ?Vitals:  ? 03/23/21 1215 03/23/21 1230 03/23/21 1300 03/23/21 1311  ?BP: (!) 74/35 (!) 93/37 (!) 74/40 (!) 83/41  ?Pulse: 96 92 86 83  ?Resp: (!) '30 18 17 16  '$ ?Temp:    98.3 ?F (36.8 ?C)  ?TempSrc:    Axillary  ?SpO2: 100% 100% 100% 100%  ?Weight:      ?Height:      ? ?General exam: Conversant, in no acute distress ?Respiratory system: normal chest rise, clear, no audible wheezing ?Cardiovascular system: regular rhythm, s1-s2 ?Gastrointestinal system: Nondistended, nontender, pos BS ?Central nervous system: No seizures, no tremors ?Extremities: No cyanosis, no joint deformities ?Skin: No rashes, no pallor ?Psychiatry: Affect normal // no auditory hallucinations  ? ?Data Reviewed: ? ?Labs reviewed. Cr 4.2, HGB 4.9, repeat 8.2 ? ?Family Communication: Pt in room, family currently at bedside ? ?Disposition: ?Status is: Inpatient ?Remains inpatient  appropriate because: Severity of illness ? Planned Discharge Destination: Home ? ? ? ? ?Author: ?Marylu Lund, MD ?03/23/2021 3:12 PM ? ?For on call review [email or web address].  ?

## 2021-03-23 NOTE — Progress Notes (Signed)
Called by bedside RN around 0430 with concerns for possible GI bleed. Pt had a large, dark bloody stool and then became dizzy. CBC ordered and results show a hgb of 4.9. Pt bp is soft. All other VS stable. Will transfuse 3 units of PRBCs for now and stop lovenox. Pt started on protonix gtt. Will need consult for GI this am.  ? ? ?Jesus Newcomer, np ?[facility]  7p-7a

## 2021-03-23 NOTE — Progress Notes (Signed)
2952 Pt transfer to Unit per MD. Rapid response RN in room.  ?

## 2021-03-23 NOTE — Progress Notes (Signed)
0425 Pt had bloody BM, Increased weakness, BP 90/38, HR 100. MD messaged, orders received, charge RN aware. Expiratory wheezing. Will continue to monitor. ?

## 2021-03-23 NOTE — Assessment & Plan Note (Signed)
Baseline on room air CXR reviewed, clear Now on [facility] Discussed with PCCM, have ordered STAT echo and LE dopplers

## 2021-03-23 NOTE — Assessment & Plan Note (Signed)
SBP into the 70's in setting of large GI bleed this AM Appreciate input by PCCM. Now on pressors

## 2021-03-24 ENCOUNTER — Inpatient Hospital Stay (HOSPITAL_COMMUNITY): Payer: PPO | Admitting: Anesthesiology

## 2021-03-24 ENCOUNTER — Encounter (HOSPITAL_COMMUNITY)
Admission: EM | Disposition: A | Discharge status: Home Health | Payer: Self-pay | Source: Ambulatory Visit | Attending: Internal Medicine

## 2021-03-24 ENCOUNTER — Inpatient Hospital Stay (HOSPITAL_COMMUNITY): Payer: PPO

## 2021-03-24 ENCOUNTER — Encounter (HOSPITAL_COMMUNITY): Payer: Self-pay | Admitting: Family Medicine

## 2021-03-24 DIAGNOSIS — K274 Chronic or unspecified peptic ulcer, site unspecified, with hemorrhage: Secondary | ICD-10-CM

## 2021-03-24 DIAGNOSIS — D62 Acute posthemorrhagic anemia: Secondary | ICD-10-CM

## 2021-03-24 DIAGNOSIS — K922 Gastrointestinal hemorrhage, unspecified: Secondary | ICD-10-CM | POA: Diagnosis not present

## 2021-03-24 DIAGNOSIS — R578 Other shock: Secondary | ICD-10-CM

## 2021-03-24 DIAGNOSIS — K2101 Gastro-esophageal reflux disease with esophagitis, with bleeding: Secondary | ICD-10-CM

## 2021-03-24 DIAGNOSIS — K2991 Gastroduodenitis, unspecified, with bleeding: Secondary | ICD-10-CM

## 2021-03-24 DIAGNOSIS — K449 Diaphragmatic hernia without obstruction or gangrene: Secondary | ICD-10-CM

## 2021-03-24 LAB — BPAM RBC
Blood Product Expiration Date: 202303222359
Blood Product Expiration Date: 202303222359
Blood Product Expiration Date: 202304032359
ISSUE DATE / TIME: 202303120539
ISSUE DATE / TIME: 202303120539
ISSUE DATE / TIME: 202303120539
Unit Type and Rh: 6200
Unit Type and Rh: 6200
Unit Type and Rh: 6200

## 2021-03-24 LAB — TYPE AND SCREEN
ABO/RH(D): A POS
Antibody Screen: NEGATIVE
Unit division: 0
Unit division: 0
Unit division: 0

## 2021-03-24 LAB — COMPREHENSIVE METABOLIC PANEL
ALT: 102 U/L — ABNORMAL HIGH (ref 0–44)
AST: 36 U/L (ref 15–41)
Albumin: 2.1 g/dL — ABNORMAL LOW (ref 3.5–5.0)
Alkaline Phosphatase: 53 U/L (ref 38–126)
Anion gap: 9 (ref 5–15)
BUN: 95 mg/dL — ABNORMAL HIGH (ref 8–23)
CO2: 16 mmol/L — ABNORMAL LOW (ref 22–32)
Calcium: 7.3 mg/dL — ABNORMAL LOW (ref 8.9–10.3)
Chloride: 112 mmol/L — ABNORMAL HIGH (ref 98–111)
Creatinine, Ser: 3.74 mg/dL — ABNORMAL HIGH (ref 0.61–1.24)
GFR, Estimated: 14 mL/min — ABNORMAL LOW (ref >60)
Glucose, Bld: 108 mg/dL — ABNORMAL HIGH (ref 70–99)
Potassium: 4.7 mmol/L (ref 3.5–5.1)
Sodium: 137 mmol/L (ref 135–145)
Total Bilirubin: 0.4 mg/dL (ref 0.3–1.2)
Total Protein: 5.1 g/dL — ABNORMAL LOW (ref 6.5–8.1)

## 2021-03-24 LAB — CBC
HCT: 26.3 % — ABNORMAL LOW (ref 39.0–52.0)
Hemoglobin: 8.8 g/dL — ABNORMAL LOW (ref 13.0–17.0)
MCH: 29.6 pg (ref 26.0–34.0)
MCHC: 33.5 g/dL (ref 30.0–36.0)
MCV: 88.6 fL (ref 80.0–100.0)
Platelets: 231 10*3/uL (ref 150–400)
RBC: 2.97 MIL/uL — ABNORMAL LOW (ref 4.22–5.81)
RDW: 17.7 % — ABNORMAL HIGH (ref 11.5–15.5)
WBC: 24.5 10*3/uL — ABNORMAL HIGH (ref 4.0–10.5)
nRBC: 0 % (ref 0.0–0.2)

## 2021-03-24 LAB — TROPONIN I (HIGH SENSITIVITY): Troponin I (High Sensitivity): 11 ng/L (ref <18)

## 2021-03-24 LAB — PROCALCITONIN: Procalcitonin: 0.77 ng/mL

## 2021-03-24 LAB — LACTIC ACID, PLASMA: Lactic Acid, Venous: 0.9 mmol/L (ref 0.5–1.9)

## 2021-03-24 SURGERY — ESOPHAGOGASTRODUODENOSCOPY (EGD) WITH PROPOFOL
Anesthesia: Monitor Anesthesia Care

## 2021-03-24 MED ORDER — ALBUMIN HUMAN 5 % IV SOLN: INTRAVENOUS | Status: DC | PRN

## 2021-03-24 MED ORDER — LIDOCAINE 2% (20 MG/ML) 5 ML SYRINGE
INTRAMUSCULAR | Status: DC | PRN
  Administered 2021-03-24: 60 mg via INTRAVENOUS

## 2021-03-24 MED ORDER — PHENYLEPHRINE 40 MCG/ML (10ML) SYRINGE FOR IV PUSH (FOR BLOOD PRESSURE SUPPORT)
PREFILLED_SYRINGE | INTRAVENOUS | Status: DC | PRN
  Administered 2021-03-24 (×4): 80 ug via INTRAVENOUS

## 2021-03-24 MED ORDER — POTASSIUM CHLORIDE 10 MEQ/100ML IV SOLN
INTRAVENOUS | Status: AC
Start: 2021-03-24 — End: 2021-03-25
  Filled 2021-03-24: qty 100

## 2021-03-24 MED ORDER — PROPOFOL 500 MG/50ML IV EMUL
INTRAVENOUS | Status: DC | PRN
  Administered 2021-03-24: 125 ug/kg/min via INTRAVENOUS

## 2021-03-24 MED ORDER — PROPOFOL 10 MG/ML IV BOLUS
INTRAVENOUS | Status: AC
  Filled 2021-03-24: qty 20

## 2021-03-24 MED ORDER — IPRATROPIUM-ALBUTEROL 0.5-2.5 (3) MG/3ML IN SOLN
3.0000 mL | Freq: Once | RESPIRATORY_TRACT | Status: AC
  Administered 2021-03-24: 3 mL via RESPIRATORY_TRACT
  Filled 2021-03-24: qty 3

## 2021-03-24 MED ORDER — MELATONIN 3 MG PO TABS
3.0000 mg | ORAL_TABLET | Freq: Once | ORAL | Status: AC
  Administered 2021-03-24: 3 mg via ORAL
  Filled 2021-03-24: qty 1

## 2021-03-24 SURGICAL SUPPLY — 15 items

## 2021-03-24 NOTE — Progress Notes (Signed)
The patient is refusing the bilateral lower extremity venous duplex. He states he isn't having any problems with his legs and doesn't see the need for this test. Furthermore, he states that he doesn't have a PE and has no suspicion of having one.  ?The patient's nurse, Minette Brine, was present for this discussion and performed a brief lower extremity examination.  ? ?03/24/21 10:20 AM ?Jesus Kim RVT   ?

## 2021-03-24 NOTE — Brief Op Note (Signed)
03/19/2021 - 03/24/2021 ? ?1:16 PM ? ?PATIENT:  Jesus Kim  88 y.o. male ? ?PRE-OPERATIVE DIAGNOSIS:  melena, anemia, ?hematochezia ? ?POST-OPERATIVE DIAGNOSIS:  Gastric and Duodenal ulcer ? ?PROCEDURE:  Procedure(s): ?ESOPHAGOGASTRODUODENOSCOPY (EGD) WITH PROPOFOL (N/A) ?BIOPSY ? ?SURGEON:  Surgeon(s) and Role: ?   * Otis Brace, MD - Primary ? ?Findings ?----------- ?-EGD showed 2 small clean-based gastric ulcer, gastritis, 1 small duodenal bulb ulcer, clean-based and duodenitis.  No evidence of active bleeding. ? ?Recommendations ?------------------------- ?-Start full liquid diet. ?-Continue Protonix drip for today ?-Monitor H&H ?-GI will follow ? ?Otis Brace MD, FACP ?03/24/2021, 1:19 PM ? ?Contact #  [phone]  ? ?

## 2021-03-24 NOTE — Transfer of Care (Signed)
Immediate Anesthesia Transfer of Care Note ? ?Patient: Jesus Kim ? ?Procedure(s) Performed: ESOPHAGOGASTRODUODENOSCOPY (EGD) WITH PROPOFOL ?BIOPSY ? ?Patient Location: Endoscopy Unit ? ?Anesthesia Type:MAC ? ?Level of Consciousness: drowsy ? ?Airway & Oxygen Therapy: Patient Spontanous Breathing and Patient connected to face mask oxygen ? ?Post-op Assessment: Report given to RN and Post -op Vital signs reviewed and stable ? ?Post vital signs: Reviewed and stable ? ?Last Vitals:  ?Vitals Value Taken Time  ?BP 88/35 03/24/21 1312  ?Temp    ?Pulse 95 03/24/21 1312  ?Resp 20 03/24/21 1313  ?SpO2 100 % 03/24/21 1312  ?Vitals shown include unvalidated device data. ? ?Last Pain:  ?Vitals:  ? 03/24/21 1235  ?TempSrc: Temporal  ?PainSc: 0-No pain  ?   ? ?Patients Stated Pain Goal: 2 (03/21/21 1225) ? ?Complications: No notable events documented. ?

## 2021-03-24 NOTE — Progress Notes (Signed)
? ?NAME:  Jesus Kim, MRN:  [id number], DOB:  [birth year], LOS: 5 ?ADMISSION DATE:  03/19/2021, CONSULTATION DATE:  3/12  ?REFERRING MD:  Dr. Wyline Kim, CHIEF COMPLAINT:  Shock, GIB, Hypoxia ? ? ?History of Present Illness:  ?88 y/o M admitted 3/8 to [facility] with fatigue, decreased intake.  Found to have an AKI (sr cr 4.5, baseline ~2.4) in setting of volume depletion & ACE-I + NSAID use.  ? ?At baseline, he is largely independent, uses a cane for ambulation.  Hx of colon cancer in 1982 s/p surgery.  Last colonoscopy 2011 in [place].  ? ?He was admitted for AKI, hyperkalemia per [facility]. On 3/12 he had sudden hypotension in setting of GIB.  Required 3 units PRBC's.  New O2 needs.  PCCM consulted for evaluation. ? ?Past Medical History:  ?Colon Cancer - 1982 s/p surgery, Jesus recall of chemo/XRT ?HTN  ?HLD ?Osteoarthritis  ?CKD IV  ?Bladder Outlet Obstruction with Chronic Hydronephrosis  ? ? ?Significant Hospital Events:  ?3/8 Admit  ?3/9 K+ normalized, cr improved. Renal US with chronic bilateral hydro.  ?3/10 Urology rec's for foley ?3/11 Cr down to 4.5, feeling better.  Generalized weakness, desat with PT to 83% + wheezing  ?3/12 PCCM consulted for melena, hypotension and lethargy. Hgb drop to 4.9% (baseline ~8).  Gi consulted with concern for possible cirrhosis vs gastritis from NSAIDS vs ulcer. PPI initiated. Requiring 6L O2.  Got 3 units PRBC's.   ? ? ? ?Interim History / Subjective:  ?O2 down to 1L  ?Afebrile, WBC down to 24.5  ?I/O 3.7L UOP, -560 ml in last 24 hours  ?On 45 mcg neosynephrine  ?Pt denies pain, SOB. Reports dry mouth. ? ?Objective   ?Blood pressure (!) 115/44, pulse 86, temperature (!) 97.5 ?F (36.4 ?C), temperature source Oral, resp. rate 16, height '5\' 6"'$  (1.676 m), weight 70.3 kg, SpO2 99 %. ?   ?   ? ?Intake/Output Summary (Last 24 hours) at 03/24/2021 0748 ?Last data filed at 03/24/2021 0645 ?Gross per 24 hour  ?Intake 3215.54 ml  ?Output 3775 ml  ?Net -559.46 ml  ? ?Filed Weights  ? 03/19/21 1648  ?Weight: 70.3  kg  ? ? ?Examination: ?General: frail elderly male lying in bed in NAD ?HEENT: MM pink/moist, Jesus JVD, fair dentition ?Neuro: Awake, alert, oriented to self, place, month.  Unable to recall events during hospitalization  ?CV: s1s2 RRR, 3/6 SEM  ?PULM:  non-labored at rest, lungs bilaterally clear  ?GI: soft, bsx4 active  ?Extremities: warm/dry, Jesus edema  ?Skin: Jesus rashes or lesions ? ?Resolved Hospital Problem list   ? ? ?Assessment & Plan:  ? ?Hypovolemic Shock in setting of GIB  ?Melena/Hematochezia  ?Nodular liver on [place], ? Early cirrhois.  ?-monitor in ICU  ?-wean neosynephrine off for MAP >65 ?-volume resuscitation with PRBC's  ?-NPO ?-likely EGD 3/13 am  ? ?Acute Blood Loss Anemia ?S/p 3 units PRBC 3/12.  ?-appreciate GI ?-continue PPI infusion  ?-pending endoscopy  ?-trend CBC ?-transfuse for Hgb <7% ? ?Acute Hypoxic Respiratory Failure ?Suspect secondary to anemia, improved with PRBC ?-wean O2 for sats >90% ?-pulmonary hygiene -IS, mobilize  ? ?Acute Metabolic Encephalopathy  ?Improving, thought shock related  ?-delirium prevention measures ?-frequent reorientation  ? ?CKD IV with AKI  ?Bladder Outlet Obstruction with Bilateral Chronic Hydronephrosis ?Foley placed 3/10.  Chronic ACE-I + NSAIDS.  ?-appreciate Urology  ?-Trend BMP / urinary output > sr cr improving  ?-Replace electrolytes as indicated ?-Avoid nephrotoxic agents, ensure adequate renal  perfusion ? ?Hyperkalemia ?Improved  ?-follow K+, replace as indicated  ? ?Baseline Essential HTN  ?-hold home agents ? ?Chronic Hyponatremia  ?-follow Na+ trend  ? ?At Risk Malnutrition  ?-encourage nutrition when able  ?Best Practice (right click and "Reselect all SmartList Selections" daily)  ?Diet/type: NPO ?DVT prophylaxis: SCD ?GI prophylaxis: PPI ?Lines: N/A ?Foley:  Yes, and it is still needed ?Code Status:  full code ?Last date of multidisciplinary goals of care discussion: per 3/12 discussion, patient's wife also in critical condition > if she is alive, he  wishes for full code, if she passes he wants to be DNR.  ? ? ? Critical Care Time: 34 minutes   ? ?Jesus Gens, MSN, APRN, NP-C, AGACNP-BC ?[facility] ?03/24/2021, 7:48 AM ? ? ?Please see [email or web address] for pager details.  ? ?From 7A-7P if Jesus response, please call [phone] ?After hours, please call Jesus Kim [phone] ? ? ?

## 2021-03-24 NOTE — Anesthesia Postprocedure Evaluation (Signed)
Anesthesia Post Note ? ?Patient: Jesus Kim ? ?Procedure(s) Performed: ESOPHAGOGASTRODUODENOSCOPY (EGD) WITH PROPOFOL ?BIOPSY ? ?  ? ?Patient location during evaluation: Endoscopy ?Anesthesia Type: MAC ?Level of consciousness: awake ?Pain management: pain level controlled ?Vital Signs Assessment: post-procedure vital signs reviewed and stable ?Respiratory status: spontaneous breathing, nonlabored ventilation, respiratory function stable and patient connected to nasal cannula oxygen ?Cardiovascular status: stable and blood pressure returned to baseline ?Postop Assessment: no apparent nausea or vomiting ?Anesthetic complications: no ?Comments: Blood pressure improved in PACU with the administration of albumin and phenylephrine. ? ? ?No notable events documented. ? ?Last Vitals:  ?Vitals:  ? 03/24/21 1800 03/24/21 1900  ?BP:  125/62  ?Pulse: (!) 110 (!) 105  ?Resp: (!) 24 (!) 30  ?Temp:  37.2 ?C  ?SpO2: 100% 98%  ?  ?Last Pain:  ?Vitals:  ? 03/24/21 1900  ?TempSrc: Oral  ?PainSc:   ? ? ?  ?  ?  ?  ?  ?  ? ?Shreyas Piatkowski P Deveney Bayon ? ? ? ? ?

## 2021-03-24 NOTE — Progress Notes (Signed)
eLink Physician-Brief Progress Note ?Patient Name: Jesus Kim ?DOB: 06-08-1924 ?MRN: [id number] ? ? ?Date of Service ? 03/24/2021  ?HPI/Events of Note ? Bedside RN concerned about shortness of breath and expiratory wheezes, requesting a breathing treatment, CXR shows bi-basilar atelectasis, which is a possible source of his symptoms.  ?eICU Interventions ? Duomed Rx x 1 ordered + Incentive Spirometer + Flutter valve Rx.  ? ? ? ?  ? ?[name] ?03/24/2021, 10:15 PM ?

## 2021-03-24 NOTE — Progress Notes (Addendum)
Pt presented to endo recovery area s/p EGD with biopsies with MD Brahmbhatt. Upon initial vital sign assessment, pt noted to be hypotensive with a BP of 88/35. Phenylephrine given by April Carter, CRNA. MDA Ellender notified and ordered 250 mL albumin bolus. Albumin infusion initiated by April Carter, CRNA. Pt's pressure currently 114/48. Per MDA Ellender, pt may be transferred back to inpt unit when albumin infusion is complete and SBP >100. Pt resting comfortably. VSS. ? ?Debarah Crape, RN ?03/24/21 ?2:05 PM ? ?Addendum: Pt transferred back to inpt unit at 1416 per endoscopy recovery criteria and MD Ellender's instructions. Pt's BP at 1410 was 115/62. ? ?Debarah Crape, RN ?03/24/21 ? ? ?

## 2021-03-24 NOTE — Progress Notes (Signed)
eLink Physician-Brief Progress Note ?Patient Name: Jesus Kim ?DOB: 1924/06/19 ?MRN: [id number] ? ? ?Date of Service ? 03/24/2021  ?HPI/Events of Note ? Patient is requesting a sleep aid.  ?eICU Interventions ? Melatonin 3 mg po x 1 ordered.  ? ? ? ?  ? ?[name] ?03/24/2021, 9:48 PM ?

## 2021-03-24 NOTE — Anesthesia Procedure Notes (Signed)
Procedure Name: Kimball ?Date/Time: 03/24/2021 1:04 PM ?Performed by: Lieutenant Diego, CRNA ?Pre-anesthesia Checklist: Patient identified, Emergency Drugs available, Suction available, Patient being monitored and Timeout performed ?Patient Re-evaluated:Patient Re-evaluated prior to induction ?Oxygen Delivery Method: Simple face mask ?Preoxygenation: Pre-oxygenation with 100% oxygen ?Induction Type: IV induction ? ? ? ? ?

## 2021-03-24 NOTE — Op Note (Signed)
[facility] ?Patient Name: Jesus Kim ?Procedure Date: 03/24/2021 ?MRN: [id number] ?Attending MD: Otis Brace , MD ?Date of Birth: February 26, 1924 ?CSN: [id number] ?Age: 88 ?Admit Type: Inpatient ?Procedure:                Upper GI endoscopy ?Indications:              Melena ?Providers:                Otis Brace, MD, Dulcy Fanny, Primitivo Gauze  ?                          [place], Technician, [employer],  ?                          Technician ?Referring MD:              ?Medicines:                Sedation Administered by an Anesthesia Professional ?Complications:            No immediate complications. ?Estimated Blood Loss:     Estimated blood loss was minimal. ?Procedure:                Pre-Anesthesia Assessment: ?                          - Prior to the procedure, a History and Physical  ?                          was performed, and patient medications and  ?                          allergies were reviewed. The patient's tolerance of  ?                          previous anesthesia was also reviewed. The risks  ?                          and benefits of the procedure and the sedation  ?                          options and risks were discussed with the patient.  ?                          All questions were answered, and informed consent  ?                          was obtained. Prior Anticoagulants: The patient has  ?                          taken no previous anticoagulant or antiplatelet  ?                          agents. ASA Grade Assessment: III - A patient with  ?                          severe systemic  disease. After reviewing the risks  ?                          and benefits, the patient was deemed in  ?                          satisfactory condition to undergo the procedure. ?                          After obtaining informed consent, the endoscope was  ?                          passed under direct vision. Throughout the  ?                          procedure, the  patient's blood pressure, pulse, and  ?                          oxygen saturations were monitored continuously. The  ?                          [id number]-[id number] ([id number]) Olympus endoscope was introduced  ?                          through the mouth, and advanced to the second part  ?                          of duodenum. The upper GI endoscopy was  ?                          accomplished without difficulty. The patient  ?                          tolerated the procedure well. ?Scope In: ?Scope Out: ?Findings: ?     LA Grade A (one or more mucosal breaks less than 5 mm, not extending  ?     between tops of 2 mucosal folds) esophagitis with no bleeding was found  ?     at the gastroesophageal junction. ?     A small hiatal hernia was present. ?     Two non-bleeding superficial gastric ulcers with no stigmata of bleeding  ?     were found in the gastric body. The largest lesion was 8 mm in largest  ?     dimension. Biopsies were taken with a cold forceps for histology. ?     Scattered moderate inflammation characterized by congestion (edema),  ?     erosions, erythema and friability was found in the entire examined  ?     stomach. ?     The cardia and gastric fundus were normal on retroflexion. ?     One non-bleeding superficial duodenal ulcer with no stigmata of bleeding  ?     was found in the duodenal bulb. The lesion was 6 mm in largest dimension. ?     Scattered mild inflammation was found in the entire duodenum. ?Impression:               -  LA Grade A reflux esophagitis with no bleeding. ?                          - Small hiatal hernia. ?                          - Non-bleeding gastric ulcers with no stigmata of  ?                          bleeding. Biopsied. ?                          - Gastritis. ?                          - Non-bleeding duodenal ulcer with no stigmata of  ?                          bleeding. ?                          - Duodenitis. ?Moderate Sedation: ?     Moderate (conscious) sedation was  personally administered by an  ?     anesthesia professional. The following parameters were monitored: oxygen  ?     saturation, heart rate, blood pressure, and response to care. ?Recommendation:           - Return patient to hospital ward for ongoing care. ?                          - Full liquid diet. ?                          - Continue present medications. ?                          - Await pathology results. ?                          - No ibuprofen, naproxen, or other non-steroidal  ?                          anti-inflammatory drugs. ?Procedure Code(s):        --- Professional --- ?                          [phone], Esophagogastroduodenoscopy, flexible,  ?                          transoral; with biopsy, single or multiple ?Diagnosis Code(s):        --- Professional --- ?                          K21.00, Gastro-esophageal reflux disease with  ?                          esophagitis, without bleeding ?  K44.9, Diaphragmatic hernia without obstruction or  ?                          gangrene ?                          K25.9, Gastric ulcer, unspecified as acute or  ?                          chronic, without hemorrhage or perforation ?                          K29.70, Gastritis, unspecified, without bleeding ?                          K26.9, Duodenal ulcer, unspecified as acute or  ?                          chronic, without hemorrhage or perforation ?                          K29.80, Duodenitis without bleeding ?                          K92.1, Melena (includes Hematochezia) ?CPT copyright 2019 [employer]. All rights reserved. ?The codes documented in this report are preliminary and upon coder review may  ?be revised to meet current compliance requirements. ?Otis Brace, MD ?Otis Brace, MD ?03/24/2021 1:13:07 PM ?Number of Addenda: 0 ?

## 2021-03-24 NOTE — Progress Notes (Signed)
Jesus Kim 88 y.o. June 21, 1924 ? ?CC: GI bleed ? ? ?Subjective: ?Patient seen and examined at bedside.  Denies any further bleeding episodes.  Mild shortness of breath. ? ?ROS : Negative for nausea and vomiting ? ? ?Objective: ?Vital signs in last 24 hours: ?Vitals:  ? 03/24/21 1115 03/24/21 1130  ?BP: (!) 103/44 (!) 113/44  ?Pulse: 95 95  ?Resp: (!) 21 20  ?Temp:    ?SpO2: 97% 96%  ? ? ?Physical Exam: ? ?General.  Elderly patient.  Not in acute distress ?Abdomen : Soft, nontender, nondistended, bowel sounds present ?Psych -mood and affect normal ?Lab Results: ?Recent Labs  ?  03/23/21 ?1637 03/24/21 ?0025  ?NA 134* 137  ?K 4.4 4.7  ?CL 112* 112*  ?CO2 15* 16*  ?GLUCOSE 100* 108*  ?BUN 97* 95*  ?CREATININE 3.91* 3.74*  ?CALCIUM 6.9* 7.3*  ? ?Recent Labs  ?  03/23/21 ?0448 03/24/21 ?0025  ?AST 102* 36  ?ALT 144* 102*  ?ALKPHOS 51 53  ?BILITOT 0.2* 0.4  ?PROT 4.8* 5.1*  ?ALBUMIN 2.0* 2.1*  ? ?Recent Labs  ?  03/23/21 ?0448 03/23/21 ?1424 03/23/21 ?1951 03/24/21 ?0025  ?WBC 28.8*  --   --  24.5*  ?HGB 4.9*   < > 9.3* 8.8*  ?HCT 15.4*   < > 28.4* 26.3*  ?MCV 98.1  --   --  88.6  ?PLT 282  --   --  231  ? < > = values in this interval not displayed.  ? ?Recent Labs  ?  03/23/21 ?1637  ?LABPROT 15.1  ?INR 1.2  ? ? ? ? ?Assessment/Plan: ?-GI bleed with melena.  Hemoglobin was down to 4.9.  Received blood transfusion.  Hemoglobin 8.8 this morning ?-Acute blood loss anemia.  S/p blood transfusion ?-Hemorrhagic shock.  Off pressor support now. ?-Remote history of colon cancer.  Last colonoscopy scope in 2011 was normal. ? ?Recommendations ?---------------------------- ?-Okay to proceed with EGD today. ? ?Risks (bleeding, infection, bowel perforation that could require surgery, sedation-related changes in cardiopulmonary systems), benefits (identification and possible treatment of source of symptoms, exclusion of certain causes of symptoms), and alternatives (watchful waiting,  radiographic imaging studies, empiric medical treatment)  were explained to patient/family in detail and patient wishes to proceed.  ? ? ?Otis Brace MD, FACP ?03/24/2021, 12:36 PM ? ?Contact #  [phone]  ?

## 2021-03-24 NOTE — Anesthesia Preprocedure Evaluation (Signed)
Anesthesia Evaluation  ?Patient identified by MRN, date of birth, ID band ?Patient awake ? ? ? ?Reviewed: ?Allergy & Precautions, NPO status , Patient's Chart, lab work & pertinent test results ? ?Airway ?Mallampati: II ? ?TM Distance: >3 FB ?Neck ROM: Full ? ? ? Dental ? ?(+) Missing ?  ?Pulmonary ?former smoker,  ?  ?Pulmonary exam normal ?breath sounds clear to auscultation ? ? ? ? ? ? Cardiovascular ?hypertension, Pt. on medications ?Normal cardiovascular exam ?Rhythm:Regular Rate:Normal ? ?ECHO: ?Left ventricular ejection fraction, by estimation, is >75%. The left ventricle has hyperdynamic function. The left ventricle has no regional wall motion abnormalities. ?Left ventricular diastolic parameters were normal. ?Right ventricular systolic function is normal. The right ventricular size is normal. ?There is normal pulmonary artery systolic pressure. Estimated pASP 27 mmHg. ?The aortic valve is tricuspid. There is moderate calcification of the aortic valve. Aortic ?valve regurgitation is not visualized. Mild aortic valve stenosis. Aortic valve area, by ?VTI measures 1.62 cm?Marland Kitchen Aortic valve mean gradient measures 9.0 mmHg. Aortic valve Vmax measures 1.96 m/s. ?No other significant valvular abnormality. ?  ?Neuro/Psych ?negative neurological ROS ? negative psych ROS  ? GI/Hepatic ?Neg liver ROS, Colon Cancer ?  ?Endo/Other  ?negative endocrine ROS ? Renal/GU ?CRFRenal disease  ? ?  ?Musculoskeletal ? ?(+) Arthritis ,  ? Abdominal ?  ?Peds ? Hematology ? ?(+) Blood dyscrasia, anemia ,   ?Anesthesia Other Findings ?melena, anemia, ?hematochezia ? Reproductive/Obstetrics ? ?  ? ? ? ? ? ? ? ? ? ? ? ? ? ?  ?  ? ? ? ? ? ? ? ? ?Anesthesia Physical ?Anesthesia Plan ? ?ASA: 3 ? ?Anesthesia Plan: MAC  ? ?Post-op Pain Management:   ? ?Induction: Intravenous ? ?PONV Risk Score and Plan: 1 and Propofol infusion and Treatment may vary due to age or medical condition ? ?Airway Management Planned:  Nasal Cannula ? ?Additional Equipment:  ? ?Intra-op Plan:  ? ?Post-operative Plan:  ? ?Informed Consent: I have reviewed the patients History and Physical, chart, labs and discussed the procedure including the risks, benefits and alternatives for the proposed anesthesia with the patient or authorized representative who has indicated his/her understanding and acceptance.  ? ? ? ?Dental advisory given ? ?Plan Discussed with: CRNA ? ?Anesthesia Plan Comments:   ? ? ? ? ? ? ?Anesthesia Quick Evaluation ? ?

## 2021-03-24 NOTE — Progress Notes (Signed)
Subjective: ?Feels thirsty. ? ?BP (!) 114/48   Pulse 92   Temp 97.8 ?F (36.6 ?C) (Temporal)   Resp 20   Ht '5\' 6"'$  (1.676 m)   Wt 70.3 kg   SpO2 97%   BMI 25.02 kg/m?  ? ?General - alert ?Eyes - pupils reactive ?ENT - no sinus tenderness, no stridor ?Cardiac - regular rate/rhythm, 2/6 SM ?Chest - equal breath sounds b/l, no wheezing or rales ?Abdomen - soft, non tender, + bowel sounds ?Extremities - no cyanosis, clubbing, or edema ?Skin - no rashes ?Neuro - normal strength, moves extremities, follows commands ?Psych - normal mood and behavior ? ?CMP Latest Ref Rng & Units 03/24/2021 03/23/2021 03/23/2021  ?Glucose 70 - 99 mg/dL 108(H) 100(H) 139(H)  ?BUN 8 - 23 mg/dL 95(H) 97(H) 103(H)  ?Creatinine 0.61 - 1.24 mg/dL 3.74(H) 3.91(H) 4.21(H)  ?Sodium 135 - 145 mmol/L 137 134(L) 130(L)  ?Potassium 3.5 - 5.1 mmol/L 4.7 4.4 5.6(H)  ?Chloride 98 - 111 mmol/L 112(H) 112(H) 108  ?CO2 22 - 32 mmol/L 16(L) 15(L) 14(L)  ?Calcium 8.9 - 10.3 mg/dL 7.3(L) 6.9(L) 7.1(L)  ?Total Protein 6.5 - 8.1 g/dL 5.1(L) - 4.8(L)  ?Total Bilirubin 0.3 - 1.2 mg/dL 0.4 - 0.2(L)  ?Alkaline Phos 38 - 126 U/L 53 - 51  ?AST 15 - 41 U/L 36 - 102(H)  ?ALT 0 - 44 U/L 102(H) - 144(H)  ? ? ?CBC Latest Ref Rng & Units 03/24/2021 03/23/2021 03/23/2021  ?WBC 4.0 - 10.5 K/uL 24.5(H) - -  ?Hemoglobin 13.0 - 17.0 g/dL 8.8(L) 9.3(L) 8.2(L)  ?Hematocrit 39.0 - 52.0 % 26.3(L) 28.4(L) 24.0(L)  ?Platelets 150 - 400 K/uL 231 - -  ? ? ?Assessment/plan: ? ?Hemorrhagic shock from GI bleeding ?- wean pressors to keep MAP >65 ? ?ABLA from Upper GI bleeding. ?- f/u CBC ?- transfuse for Hb < 7 ? ?Gastric and duodenal ulcer. ?- suspect from chronic NSAID use ?- continue protonix  ?- advance diet after EGD ? ?AKI from ATN in setting of hemorrhagic shock. ?CKD 4. ?Non gap metabolic acidosis from renal failure. ?- f/u BMET ?- monitor urine outpt ? ?Leukocytosis. ?- likely reactive ?- f/u CBC ? ?CC time by me independent of APP time 34 minutes ? ?Chesley Mires, MD ?[place] ?Pager - [phone] ?03/24/2021, 1:56 PM ? ?

## 2021-03-25 ENCOUNTER — Encounter (HOSPITAL_COMMUNITY): Payer: Self-pay | Admitting: Gastroenterology

## 2021-03-25 DIAGNOSIS — J9601 Acute respiratory failure with hypoxia: Secondary | ICD-10-CM

## 2021-03-25 LAB — COMPREHENSIVE METABOLIC PANEL
ALT: 94 U/L — ABNORMAL HIGH (ref 0–44)
AST: 60 U/L — ABNORMAL HIGH (ref 15–41)
Albumin: 2.3 g/dL — ABNORMAL LOW (ref 3.5–5.0)
Alkaline Phosphatase: 50 U/L (ref 38–126)
Anion gap: 9 (ref 5–15)
BUN: 70 mg/dL — ABNORMAL HIGH (ref 8–23)
CO2: 16 mmol/L — ABNORMAL LOW (ref 22–32)
Calcium: 7.3 mg/dL — ABNORMAL LOW (ref 8.9–10.3)
Chloride: 108 mmol/L (ref 98–111)
Creatinine, Ser: 3.42 mg/dL — ABNORMAL HIGH (ref 0.61–1.24)
GFR, Estimated: 16 mL/min — ABNORMAL LOW (ref >60)
Glucose, Bld: 115 mg/dL — ABNORMAL HIGH (ref 70–99)
Potassium: 4.5 mmol/L (ref 3.5–5.1)
Sodium: 133 mmol/L — ABNORMAL LOW (ref 135–145)
Total Bilirubin: 0.3 mg/dL (ref 0.3–1.2)
Total Protein: 5.2 g/dL — ABNORMAL LOW (ref 6.5–8.1)

## 2021-03-25 LAB — CBC
HCT: 23.2 % — ABNORMAL LOW (ref 39.0–52.0)
Hemoglobin: 7.8 g/dL — ABNORMAL LOW (ref 13.0–17.0)
MCH: 30 pg (ref 26.0–34.0)
MCHC: 33.6 g/dL (ref 30.0–36.0)
MCV: 89.2 fL (ref 80.0–100.0)
Platelets: 206 10*3/uL (ref 150–400)
RBC: 2.6 MIL/uL — ABNORMAL LOW (ref 4.22–5.81)
RDW: 17.4 % — ABNORMAL HIGH (ref 11.5–15.5)
WBC: 23.1 10*3/uL — ABNORMAL HIGH (ref 4.0–10.5)
nRBC: 0 % (ref 0.0–0.2)

## 2021-03-25 LAB — SURGICAL PATHOLOGY

## 2021-03-25 LAB — PROCALCITONIN: Procalcitonin: 0.52 ng/mL

## 2021-03-25 MED ORDER — LABETALOL HCL 5 MG/ML IV SOLN
5.0000 mg | INTRAVENOUS | Status: DC | PRN
  Administered 2021-03-26: 5 mg via INTRAVENOUS
  Filled 2021-03-25: qty 4

## 2021-03-25 MED ORDER — SODIUM CHLORIDE 0.9 % IV SOLN
1.0000 g | Freq: Once | INTRAVENOUS | Status: AC
  Administered 2021-03-25: 1 g via INTRAVENOUS
  Filled 2021-03-25: qty 10

## 2021-03-25 MED ORDER — ACETAMINOPHEN 325 MG PO TABS
650.0000 mg | ORAL_TABLET | Freq: Four times a day (QID) | ORAL | Status: DC | PRN
  Administered 2021-03-26 – 2021-03-27 (×3): 650 mg via ORAL
  Filled 2021-03-25 (×3): qty 2

## 2021-03-25 MED ORDER — LACTATED RINGERS IV SOLN: INTRAVENOUS | Status: DC

## 2021-03-25 MED ORDER — SODIUM CHLORIDE 0.9 % IV SOLN
1.0000 g | INTRAVENOUS | Status: DC
  Administered 2021-03-25: 1 g via INTRAVENOUS
  Filled 2021-03-25: qty 10

## 2021-03-25 MED ORDER — ACETAMINOPHEN 650 MG RE SUPP
325.0000 mg | RECTAL | Status: DC | PRN
  Administered 2021-03-28: 325 mg via RECTAL
  Filled 2021-03-25: qty 1

## 2021-03-25 MED ORDER — SODIUM CHLORIDE 0.9 % IV SOLN
2.0000 g | INTRAVENOUS | Status: DC
  Administered 2021-03-26 – 2021-03-28 (×3): 2 g via INTRAVENOUS
  Filled 2021-03-25 (×3): qty 20

## 2021-03-25 NOTE — Progress Notes (Signed)
?Progress Note ? ? ?Patient: Jesus Kim BSW:[id number] DOB: [birth year] DOA: 03/19/2021     6 ?DOS: the patient was seen and examined on 03/25/2021 ?  ?Brief hospital course: ?88 y.o. male with medical history significant of HTN, OA, HLD, CKD 4 who presents after being called by his PCP to come to the emergency room due to abnormal lab work.  He reports he has had fatigue for the last few days and was going for his routine lab work with his PCP today.  States he has had decreased p.o. intake for the last 2 days due to his not feeling well.  He has not had any nausea, vomiting, diarrhea, fever.  He has no specific complaints and denies any chest pain, cough, shortness of breath, palpitations, confusion, urinary symptoms, muscle aches.  Lives at home with his wife.  No recent travel. ?No known sick contacts other than his wife has some mild nasal congestion. ?Was found to have AKI on repeat lab work with creatinine of 4.58 which is increased from his baseline of 2.4-2.5.  Hospitalist service was asked to admit for further management ? ?Assessment and Plan: ?* Acute kidney injury superimposed on CKD ([place]) ?Pt has known hx of urethral stricture with chronic bladder outlet obstruction and chronic B hydronephrosis ?Ordered and reviewed renal US with findings suggesting chronic B hydronephrosis  ?Appreciate assistance by Urology. Pt is now s/p foley placement, to remain in until f/u with Urology as outpt ?Cr down to 3.42 ? ?Hyperkalemia ?Likely secondary to renal failure ?Corrected, now normal ?Repeat bmet in AM ? ?Essential hypertension ?Hold lisinopril with AKI.  ?Recent hypovolemic shock from acute blood loss anemia requiring brief pressor support ?Had been continued on norvasc, now remains on hold given recent hypovolemic shock ?Pt now off pressors as of 3/13 with stable BP ? ?Chronic hyponatremia ?Improved with IVF ?Repeat bmet in AM ? ?Acute hypoxemic respiratory failure ([place]) ?Baseline on room air ?CXR reviewed, clear,  VQ neg ?On 3/12, required 6LNC likely secondary to marked anemia ?Now weaned to room air ? ? ?Hemorrhagic shock ([place]) ?On 3/12, SBP into the 70's in setting of large GI bleed this AM ?Appreciate input by PCCM. Required brief pressor support, now off pressors as of 3/13 ? ?Acute blood loss anemia ?On 3/12, noted to have large dark bloody BM with subsequent hypotension ?Hgb down to 4.9 requiring s/p 3 units PRBC's ?Seen by GI, and pt underwent EGD 3/13 with findings of multiple ulcers without active bleed ?Cont PPI ?Cont follow CBC trends ? ? ? ? ?  ? ?Subjective: Reports feeling weak this AM ? ?Physical Exam: ?Vitals:  ? 03/25/21 0852 03/25/21 0900 03/25/21 1000 03/25/21 1128  ?BP:  138/63 (!) 133/52   ?Pulse:  (!) 109 (!) 104   ?Resp:  (!) 21 (!) 22   ?Temp: 98.5 ?F (36.9 ?C)   99.8 ?F (37.7 ?C)  ?TempSrc: Oral   Axillary  ?SpO2:  98% 98%   ?Weight:      ?Height:      ? ?General exam: Awake, laying in bed, in nad ?Respiratory system: Normal respiratory effort, no wheezing ?Cardiovascular system: regular rate, s1, s2 ?Gastrointestinal system: Soft, nondistended, positive BS ?Central nervous system: CN2-12 grossly intact, strength intact ?Extremities: Perfused, no clubbing ?Skin: Normal skin turgor, no notable skin lesions seen ?Psychiatry: Mood normal // no visual hallucinations  ? ?Data Reviewed: ? ?Labs reviewed. Cr 3.42, HGB 7.8 ? ?Family Communication: Pt in room, family currently at bedside ? ?Disposition: ?  Status is: Inpatient ?Remains inpatient appropriate because: Severity of illness ? Planned Discharge Destination: Home ? ? ? ? ?Author: ?Marylu Lund, MD ?03/25/2021 11:29 AM ? ?For on call review [email or web address].  ?

## 2021-03-25 NOTE — Plan of Care (Signed)

## 2021-03-25 NOTE — Progress Notes (Signed)
[facility] Progress Note ? ?Jesus Kim 88 y.o. 06-16-1924 ? ?CC: GI bleed ? ? ?Subjective: ?Patient seen and examined at bedside.  Family at bedside.  Few bowel movements yesterday containing clotted and dark blood.  Had 2 small bowel movements today containing some dark-colored stool.  Patient denies abdominal pain. ? ?ROS : Afebrile, negative for vomiting. ? ? ?Objective: ?Vital signs in last 24 hours: ?Vitals:  ? 03/25/21 1128 03/25/21 1200  ?BP:  135/64  ?Pulse:  (!) 117  ?Resp:  (!) 24  ?Temp: 99.8 ?F (37.7 ?C)   ?SpO2:  99%  ? ? ?Physical Exam: ? ?General -elderly patient, not in acute distress ?Abdomen -soft, nontender, nondistended, bowel sounds present.  No peritoneal sign ? ?Lab Results: ?Recent Labs  ?  03/24/21 ?0025 03/25/21 ?0233  ?NA 137 133*  ?K 4.7 4.5  ?CL 112* 108  ?CO2 16* 16*  ?GLUCOSE 108* 115*  ?BUN 95* 70*  ?CREATININE 3.74* 3.42*  ?CALCIUM 7.3* 7.3*  ? ?Recent Labs  ?  03/24/21 ?0025 03/25/21 ?0233  ?AST 36 60*  ?ALT 102* 94*  ?ALKPHOS 53 50  ?BILITOT 0.4 0.3  ?PROT 5.1* 5.2*  ?ALBUMIN 2.1* 2.3*  ? ?Recent Labs  ?  03/24/21 ?0025 03/25/21 ?0233  ?WBC 24.5* 23.1*  ?HGB 8.8* 7.8*  ?HCT 26.3* 23.2*  ?MCV 88.6 89.2  ?PLT 231 206  ? ?Recent Labs  ?  03/23/21 ?1637  ?LABPROT 15.1  ?INR 1.2  ? ? ? ? ?Assessment/Plan: ?-GI bleed with melena.  Hemoglobin was down to 4.9.  EGD on March 24, 2021 showed gastric and duodenal ulcers.  Clean-based.  No active bleeding.  Gastric biopsies negative for H. pylori. ?-Acute blood loss anemia.  S/p blood transfusion ?-Hemorrhagic shock.  Off pressor support now. ?-Remote history of colon cancer.  Last colonoscopy scope in 2011 was normal. ?  ?Recommendation ?------------------------- ?-Continue IV Protonix for now ?-Continue full liquid diet today. ?-Consider advancing diet to soft tomorrow if no further bleeding episodes and hemoglobin stable. ?-Monitor H&H.  Transfuse as needed ?-Discussed with RN and family at bedside. ?-We will follow ? ? ?Otis Brace MD, FACP ?03/25/2021, 1:37 PM ? ?Contact #  [phone]  ?

## 2021-03-25 NOTE — Progress Notes (Signed)
eLink Physician-Brief Progress Note ?Patient Name: Jesus Kim ?DOB: Jul 24, 1924 ?MRN: [id number] ? ? ?Date of Service ? 03/25/2021  ?HPI/Events of Note ? Patient with recent GI bleeding, now passing clots per rectum, hemoglobin is 7.8 gm / dl which is lower than previous baseline of 8.8 gm / dl.  ?eICU Interventions ? Trend H and H, transfuse  for hemoglobin < 7.0 gm / dl.  ? ? ? ?  ? ?[name] ?03/25/2021, 5:52 AM ?

## 2021-03-25 NOTE — TOC Initial Note (Signed)
Transition of Care (TOC) - Initial/Assessment Note  ? ? ?Patient Details  ?Name: Jesus Kim ?MRN: [id number] ?Date of Birth: [birth year] ? ?Transition of Care (TOC) CM/SW Contact:    ?Burch Marchuk, LCSW ?Phone Number: ?03/25/2021, 2:42 PM ? ?Clinical Narrative:                 ?Met briefly with pt to introduce self/ TOC role with dc planning needs.  Pt very gracious and confirms he has much support from his local daughters.  Wife is currently hospitalized at [facility] as well.  Anticipates a home dc and likely to need some HHPT/OT.  Will follow along with patient. ? ?Expected Discharge Plan: [place] ?Barriers to Discharge: Continued Medical Work up ? ? ?Patient Goals and CMS Choice ?Patient states their goals for this hospitalization and ongoing recovery are:: return home ?  ?  ? ?Expected Discharge Plan and Services ?Expected Discharge Plan: [place] ?In-house Referral: Clinical Social Work ?  ?  ?Living arrangements for the past 2 months: Apartment ?                ?  ?  ?  ?  ?  ?  ?  ?  ?  ?  ? ?Prior Living Arrangements/Services ?Living arrangements for the past 2 months: Apartment ?Lives with:: Spouse ?Patient language and need for interpreter reviewed:: Yes ?Do you feel safe going back to the place where you live?: Yes      ?Need for Family Participation in Patient Care: Yes (Comment) ?Care giver support system in place?: Yes (comment) ?  ?Criminal Activity/Legal Involvement Pertinent to Current Situation/Hospitalization: No - Comment as needed ? ?Activities of Daily Living ?Home Assistive Devices/Equipment: Other (Comment), Eyeglasses (walking sticks, reading glasses) ?ADL Screening (condition at time of admission) ?Patient's cognitive ability adequate to safely complete daily activities?: Yes ?Is the patient deaf or have difficulty hearing?: No ?Does the patient have difficulty seeing, even when wearing glasses/contacts?: No ?Does the patient have difficulty concentrating,  remembering, or making decisions?: No ?Patient able to express need for assistance with ADLs?: Yes ?Does the patient have difficulty dressing or bathing?: No ?Independently performs ADLs?: Yes (appropriate for developmental age) ?Does the patient have difficulty walking or climbing stairs?: No ?Weakness of Legs: Both ?Weakness of Arms/Hands: Both ? ?Permission Sought/Granted ?Permission sought to share information with : Family Supports ?Permission granted to share information with : Yes, Verbal Permission Granted ? Share Information with NAME: Shari Heritage ?   ? Permission granted to share info w Relationship: daughter ? Permission granted to share info w Contact Information: [phone] ? ?Emotional Assessment ?Appearance:: Appears stated age ?Attitude/Demeanor/Rapport: Gracious ?Affect (typically observed): Accepting ?Orientation: : Oriented to Self, Oriented to Place, Oriented to Situation ?Alcohol / Substance Use: Not Applicable ?Psych Involvement: No (comment) ? ?Admission diagnosis:  ARF (acute renal failure) ([place]) [N17.9] ?Acute kidney injury superimposed on CKD ([place]) [N17.9, N18.9] ?Patient Active Problem List  ? Diagnosis Date Noted  ? Acute blood loss anemia 03/23/2021  ? Hemorrhagic shock ([place]) 03/23/2021  ? Acute hypoxemic respiratory failure ([place]) 03/23/2021  ? Acute kidney injury superimposed on CKD ([place]) 03/19/2021  ? Chronic hyponatremia 03/19/2021  ? Hyperkalemia 03/19/2021  ? Essential hypertension 03/19/2021  ? UTI (urinary tract infection) 11/15/2019  ? Arthritis of sacroiliac joint of both sides 07/25/2019  ? Degenerative disc disease, lumbar 05/08/2019  ? Anserine bursitis 12/20/2018  ? Gluteal tendinitis of right buttock 10/13/2017  ? Right  hip pain 09/16/2017  ? Right knee sprain 02/27/2015  ? ?PCP:  [name], MD ?Pharmacy:   ?[id number] - [address] ?[address] ?[place] [address] ?Phone: [phone] Fax:  [phone] ? ?[facility] [id number] - [address], [address] ?[place] ?[place] [address] ?Phone: [phone] Fax: [phone] ? ? ? ? ?Social Determinants of Health (SDOH) Interventions ?  ? ?Readmission Risk Interventions ?No flowsheet data found. ? ? ?

## 2021-03-25 NOTE — Progress Notes (Signed)
Physical Therapy Treatment ?Patient Details ?Name: Jesus Kim ?MRN: [id number] ?DOB: 1924/05/01 ?Today's Date: 03/25/2021 ? ? ?History of Present Illness 88 yo male admitted with AKI on CKD. Hx of urethral stricture, bilateral hydronephrosis, CKD. ? ?  ?PT Comments  ? ? General Comments: AxO x 3 very sweet man but feels "really bad", "weak" and c/o rectal pain and dizziness with standing.  HgB 7.1  ?Assisted pt OOB to recliner only.  General bed mobility comments: Min Assist for upper body and Mod Assist to complete scooting to EOB using bed pad.  Max c/o rectal pain increased with sitting. General transfer comment: Assist to rise, steady, control descent. Cues for safety, hand placement. increased c/o dizziness with standing.   ?Spoke with family, they plans to "NOT put either Jesus Kim" in a Nursing Home.  Plan is to D/C both Jesus Kim's to home with [facility] PT and 24/7 family care.  Mr Jesus Kim needs a RW and 3:1. ?  ?Recommendations for follow up therapy are one component of a multi-disciplinary discharge planning process, led by the attending physician.  Recommendations may be updated based on patient status, additional functional criteria and insurance authorization. ? ?Follow Up Recommendations ? Home health PT ?  ?  ?Assistance Recommended at Discharge Frequent or constant Supervision/Assistance  ?Patient can return home with the following A little help with walking and/or transfers;A little help with bathing/dressing/bathroom;Assistance with cooking/housework;Assist for transportation;Help with stairs or ramp for entrance ?  ?Equipment Recommendations ? Rolling walker (2 wheels);BSC/3in1  ?  ?Recommendations for Other Services   ? ? ?  ?Precautions / Restrictions Precautions ?Precautions: Fall ?Precaution Comments: monitor vitals/labs ?Restrictions ?Weight Bearing Restrictions: No  ?  ? ?Mobility ? Bed Mobility ?Overal bed mobility: Needs Assistance ?Bed Mobility: Supine to Sit ?  ?  ?Supine to sit: Min assist, Mod assist ?  ?   ?General bed mobility comments: Min Assist for upper body and Mod Assist to complete scooting to EOB using bed pad.  Max c/o rectal pain increased with sitting. ?  ? ?Transfers ?Overall transfer level: Needs assistance ?Equipment used: None ?Transfers: Sit to/from Stand, Bed to chair/wheelchair/BSC ?Sit to Stand: Min assist ?  ?  ?  ?  ?  ?General transfer comment: Assist to rise, steady, control descent. Cues for safety, hand placement. increased c/o dizziness with standing.  HgB 7.1 this am. ?  ? ?Ambulation/Gait ?  ?  ?  ?  ?  ?  ?  ?General Gait Details: defered amb due to HgB 7.1 and dizziness with standing as well as Max c/o weakness. ? ? ?Stairs ?  ?  ?  ?  ?  ? ? ?Wheelchair Mobility ?  ? ?Modified Rankin (Stroke Patients Only) ?  ? ? ?  ?Balance   ?  ?  ?  ?  ?  ?  ?  ?  ?  ?  ?  ?  ?  ?  ?  ?  ?  ?  ?  ? ?  ?Cognition Arousal/Alertness: Awake/alert ?Behavior During Therapy: [facility] for tasks assessed/performed ?Overall Cognitive Status: Within Functional Limits for tasks assessed ?  ?  ?  ?  ?  ?  ?  ?  ?  ?  ?  ?  ?  ?  ?  ?  ?General Comments: AxO x 3 very sweet man but feels "really bad", "weak" and c/o rectal pain ?  ?  ? ?  ?Exercises   ? ?  ?General  Comments   ?  ?  ? ?Pertinent Vitals/Pain Pain Assessment ?Pain Assessment: Faces ?Faces Pain Scale: Hurts little more ?Pain Location: rectum ?Pain Descriptors / Indicators: Discomfort, Sore, Tender ?Pain Intervention(s): Monitored during session  ? ? ?Home Living   ?  ?  ?  ?  ?  ?  ?  ?  ?  ?   ?  ?Prior Function    ?  ?  ?   ? ?PT Goals (current goals can now be found in the care plan section) Progress towards PT goals: Progressing toward goals ? ?  ?Frequency ? ? ? Min 3X/week ? ? ? ?  ?PT Plan Current plan remains appropriate  ? ? ?Co-evaluation   ?  ?  ?  ?  ? ?  ?AM-PAC PT "6 Clicks" Mobility   ?Outcome Measure ? Help needed turning from your back to your side while in a flat bed without using bedrails?: A Little ?Help needed moving from lying on your  back to sitting on the side of a flat bed without using bedrails?: A Little ?Help needed moving to and from a bed to a chair (including a wheelchair)?: A Little ?Help needed standing up from a chair using your arms (e.g., wheelchair or bedside chair)?: A Little ?Help needed to walk in hospital room?: A Lot ?Help needed climbing 3-5 steps with a railing? : A Lot ?6 Click Score: 16 ? ?  ?End of Session Equipment Utilized During Treatment: Gait belt ?Activity Tolerance: Patient limited by fatigue ?Patient left: in chair;with chair alarm set;with call bell/phone within reach ?Nurse Communication: Mobility status ?PT Visit Diagnosis: Unsteadiness on feet (R26.81);Muscle weakness (generalized) (M62.81);Difficulty in walking, not elsewhere classified (R26.2) ?  ? ? ?Time: 1025-8527 ?PT Time Calculation (min) (ACUTE ONLY): 23 min ? ?Charges:  $Therapeutic Activity: 23-37 mins          ?          ? ?{Alyssandra Hulsebus  PTA ?Acute  Rehabilitation Services ?Pager      [phone] ?Office      [phone] ? ?

## 2021-03-26 ENCOUNTER — Inpatient Hospital Stay (HOSPITAL_COMMUNITY): Payer: PPO

## 2021-03-26 DIAGNOSIS — R7989 Other specified abnormal findings of blood chemistry: Secondary | ICD-10-CM | POA: Diagnosis not present

## 2021-03-26 DIAGNOSIS — D62 Acute posthemorrhagic anemia: Secondary | ICD-10-CM | POA: Diagnosis not present

## 2021-03-26 DIAGNOSIS — R748 Abnormal levels of other serum enzymes: Secondary | ICD-10-CM | POA: Diagnosis present

## 2021-03-26 DIAGNOSIS — K7200 Acute and subacute hepatic failure without coma: Secondary | ICD-10-CM | POA: Diagnosis present

## 2021-03-26 DIAGNOSIS — J9601 Acute respiratory failure with hypoxia: Secondary | ICD-10-CM | POA: Diagnosis not present

## 2021-03-26 DIAGNOSIS — N179 Acute kidney failure, unspecified: Secondary | ICD-10-CM | POA: Diagnosis not present

## 2021-03-26 LAB — COMPREHENSIVE METABOLIC PANEL
ALT: 144 U/L — ABNORMAL HIGH (ref 0–44)
AST: 123 U/L — ABNORMAL HIGH (ref 15–41)
Albumin: 2.5 g/dL — ABNORMAL LOW (ref 3.5–5.0)
Alkaline Phosphatase: 63 U/L (ref 38–126)
Anion gap: 11 (ref 5–15)
BUN: 51 mg/dL — ABNORMAL HIGH (ref 8–23)
CO2: 16 mmol/L — ABNORMAL LOW (ref 22–32)
Calcium: 7.6 mg/dL — ABNORMAL LOW (ref 8.9–10.3)
Chloride: 106 mmol/L (ref 98–111)
Creatinine, Ser: 2.59 mg/dL — ABNORMAL HIGH (ref 0.61–1.24)
GFR, Estimated: 22 mL/min — ABNORMAL LOW (ref >60)
Glucose, Bld: 112 mg/dL — ABNORMAL HIGH (ref 70–99)
Potassium: 4.2 mmol/L (ref 3.5–5.1)
Sodium: 133 mmol/L — ABNORMAL LOW (ref 135–145)
Total Bilirubin: 0.2 mg/dL — ABNORMAL LOW (ref 0.3–1.2)
Total Protein: 6 g/dL — ABNORMAL LOW (ref 6.5–8.1)

## 2021-03-26 LAB — CBC
HCT: 25.5 % — ABNORMAL LOW (ref 39.0–52.0)
Hemoglobin: 8.3 g/dL — ABNORMAL LOW (ref 13.0–17.0)
MCH: 29.5 pg (ref 26.0–34.0)
MCHC: 32.5 g/dL (ref 30.0–36.0)
MCV: 90.7 fL (ref 80.0–100.0)
Platelets: 257 10*3/uL (ref 150–400)
RBC: 2.81 MIL/uL — ABNORMAL LOW (ref 4.22–5.81)
RDW: 17.1 % — ABNORMAL HIGH (ref 11.5–15.5)
WBC: 32 10*3/uL — ABNORMAL HIGH (ref 4.0–10.5)
nRBC: 0 % (ref 0.0–0.2)

## 2021-03-26 LAB — LACTIC ACID, PLASMA
Lactic Acid, Venous: 0.8 mmol/L (ref 0.5–1.9)
Lactic Acid, Venous: 1.2 mmol/L (ref 0.5–1.9)

## 2021-03-26 LAB — PROCALCITONIN: Procalcitonin: 0.52 ng/mL

## 2021-03-26 NOTE — Progress Notes (Signed)
[facility] Progress Note ? ?Jesus Kim 88 y.o. 10-Nov-1924 ? ?CC: GI bleed ? ? ?Subjective: ?Patient seen and examined at bedside.  Resting comfortably in the recliner.  Denies any GI issues.  Discussed with RN.  No further bleeding episodes. ? ?ROS : Afebrile, negative for vomiting. ? ? ?Objective: ?Vital signs in last 24 hours: ?Vitals:  ? 03/26/21 1200 03/26/21 1300  ?BP:  (!) 114/55  ?Pulse:  (!) 111  ?Resp:  20  ?Temp: 98.1 ?F (36.7 ?C)   ?SpO2:  100%  ? ? ?Physical Exam: ? ?General -elderly patient, not in acute distress ?Abdomen -soft, nontender, nondistended, bowel sounds present.  No peritoneal sign ? ?Lab Results: ?Recent Labs  ?  03/25/21 ?2637 03/26/21 ?0234  ?NA 133* 133*  ?K 4.5 4.2  ?CL 108 106  ?CO2 16* 16*  ?GLUCOSE 115* 112*  ?BUN 70* 51*  ?CREATININE 3.42* 2.59*  ?CALCIUM 7.3* 7.6*  ? ?Recent Labs  ?  03/25/21 ?0233 03/26/21 ?0234  ?AST 60* 123*  ?ALT 94* 144*  ?ALKPHOS 50 63  ?BILITOT 0.3 0.2*  ?PROT 5.2* 6.0*  ?ALBUMIN 2.3* 2.5*  ? ?Recent Labs  ?  03/25/21 ?0233 03/26/21 ?0234  ?WBC 23.1* 32.0*  ?HGB 7.8* 8.3*  ?HCT 23.2* 25.5*  ?MCV 89.2 90.7  ?PLT 206 257  ? ?Recent Labs  ?  03/23/21 ?1637  ?LABPROT 15.1  ?INR 1.2  ? ? ? ? ?Assessment/Plan: ?-GI bleed with melena.  Hemoglobin was down to 4.9.  EGD on March 24, 2021 showed gastric and duodenal ulcers.  Clean-based.  No active bleeding.  Gastric biopsies negative for H. pylori. ?-Acute blood loss anemia.  S/p blood transfusion ?-Hemorrhagic shock.  Off pressor support now. ?-Remote history of colon cancer.  Last colonoscopy scope in 2011 was normal. ?-Abnormal LFTs.  Chronic.  Normal INR.  Normal platelets ?  ?Recommendation ?------------------------- ?-Discussed with RN.  No further bleeding episodes.  Hemoglobin stable.  Advance diet to soft ?-Patient's abnormal LFTs are chronic since 2019.  Current numbers do not support diagnosis of ischemic hepatitis  ?-CT scan in June 2020 showed normal-appearing liver.  I will get ultrasound  liver  ?-GI will follow ? ? ?Otis Brace MD, FACP ?03/26/2021, 1:43 PM ? ?Contact #  [phone]  ?

## 2021-03-26 NOTE — Progress Notes (Addendum)
Physical Therapy Treatment ?Patient Details ?Name: Jesus Kim ?MRN: [id number] ?DOB: 1924/11/21 ?Today's Date: 03/26/2021 ? ? ?History of Present Illness 88 yo male admitted with AKI on CKD. Hx of urethral stricture, bilateral hydronephrosis, CKD. ? ?  ?PT Comments  ? ? Pt looks worse.  General Comments: AxO x 3 very sweet man but feels "really bad", "weak" and c/o rectal pain.  Noted increased WBC. Also c/o pain from cath.  Scrotum with increased redness and swelling.  RN called to room and aware.  Resting HR in upper 120's with RR >30.   ?Assisted OOB to recliner only was all he could tolerate.  General transfer comment: required increased assist this session.  Assisted from bed to recliner was all he could tolerate.  Not able to attempt amb. ?Family plans to D/C pt back home with 24/7 care.    ?Recommendations for follow up therapy are one component of a multi-disciplinary discharge planning process, led by the attending physician.  Recommendations may be updated based on patient status, additional functional criteria and insurance authorization. ? ?Follow Up Recommendations ? Home health PT (family arranging 24/7 care) ?  ?  ?Assistance Recommended at Discharge Frequent or constant Supervision/Assistance  ?Patient can return home with the following A little help with walking and/or transfers;A little help with bathing/dressing/bathroom;Assistance with cooking/housework;Assist for transportation;Help with stairs or ramp for entrance ?  ?Equipment Recommendations ?    ?  ?Recommendations for Other Services   ? ? ?  ?Precautions / Restrictions Precautions ?Precautions: Fall ?Precaution Comments: monitor vitals/labs ?Restrictions ?Weight Bearing Restrictions: No  ?  ? ?Mobility ? Bed Mobility ?Overal bed mobility: Needs Assistance ?Bed Mobility: Supine to Sit ?  ?  ?Supine to sit: Mod assist, Max assist ?  ?  ?General bed mobility comments: required increased assist due to increased c/o weakness and feeling "really  bad". ?  ? ?Transfers ?Overall transfer level: Needs assistance ?Equipment used: None ?Transfers: Sit to/from Stand, Bed to chair/wheelchair/BSC ?Sit to Stand: Mod assist, Max assist ?  ?  ?  ?  ?  ?General transfer comment: required increased assist this session.  Assisted from bed to recliner was all he could tolerate.  Not able to attempt amb. ?  ? ?Ambulation/Gait ?  ?  ?  ?  ?  ?  ?  ?General Gait Details: resting HR upper 120's and RR > 35.  WBC trending upward. Not yet able to tolerate gait. ? ? ?Stairs ?  ?  ?  ?  ?  ? ? ?Wheelchair Mobility ?  ? ?Modified Rankin (Stroke Patients Only) ?  ? ? ?  ?Balance   ?  ?  ?  ?  ?  ?  ?  ?  ?  ?  ?  ?  ?  ?  ?  ?  ?  ?  ?  ? ?  ?Cognition   ?Behavior During Therapy: [facility] for tasks assessed/performed ?Overall Cognitive Status: Within Functional Limits for tasks assessed ?  ?  ?  ?  ?  ?  ?  ?  ?  ?  ?  ?  ?  ?  ?  ?  ?General Comments: AxO x 3 very sweet man but feels "really bad", "weak" and c/o rectal pain.  Noted increased WBC. ?  ?  ? ?  ?Exercises   ? ?  ?General Comments   ?  ?  ? ?Pertinent Vitals/Pain Pain Assessment ?Pain Assessment: Faces ?Faces Pain Scale:  Hurts little more ?Pain Location: scrotum (red/edema) ?Pain Descriptors / Indicators: Discomfort, Tender ?Pain Intervention(s): Monitored during session, Repositioned (elevation)  ? ? ?Home Living   ?  ?  ?  ?  ?  ?  ?  ?  ?  ?   ?  ?Prior Function    ?  ?  ?   ? ?PT Goals (current goals can now be found in the care plan section) Progress towards PT goals: Progressing toward goals ? ?  ?Frequency ? ? ? Min 3X/week ? ? ? ?  ?PT Plan Current plan remains appropriate  ? ? ?Co-evaluation   ?  ?  ?  ?  ? ?  ?AM-PAC PT "6 Clicks" Mobility   ?Outcome Measure ?   ?  ?  ?  ?  ?  ?  ? ?  ?End of Session Equipment Utilized During Treatment: Gait belt ?Activity Tolerance: Patient limited by fatigue ?Patient left: in chair;with chair alarm set;with call bell/phone within reach ?Nurse Communication: Mobility status ?PT  Visit Diagnosis: Unsteadiness on feet (R26.81);Muscle weakness (generalized) (M62.81);Difficulty in walking, not elsewhere classified (R26.2) ?  ? ? ?Time: 1203-1226 ?PT Time Calculation (min) (ACUTE ONLY): 23 min ? ?Charges:  $Therapeutic Activity: 23-37 mins          ?          ? ?Rica Koyanagi  PTA ?Acute  Rehabilitation Services ?Pager      [phone] ?Office      [phone] ? ? ?

## 2021-03-26 NOTE — Progress Notes (Signed)
?PROGRESS NOTE ? ? ? ?Jesus Kim  HYI:[id number] DOB: 11-13-1924 DOA: 03/19/2021 ?PCP: [name], MD  ? ?Brief Narrative:  ?88 y.o. WM PMHx  HTN, OA, HLD, CKD 4  ? ?Presents after being called by his PCP to come to the emergency room due to abnormal lab work.  He reports he has had fatigue for the last few days and was going for his routine lab work with his PCP today.  States he has had decreased p.o. intake for the last 2 days due to his not feeling well.  He has not had any nausea, vomiting, diarrhea, fever.  He has no specific complaints and denies any chest pain, cough, shortness of breath, palpitations, confusion, urinary symptoms, muscle aches.  Lives at home with his wife.  No recent travel. ?No known sick contacts other than his wife has some mild nasal congestion. ?Was found to have AKI on repeat lab work with creatinine of 4.58 which is increased from his baseline of 2.4-2.5.  Hospitalist service was asked to admit for further management ? ? ?Subjective: ?A/O x4, negative abdominal pain, negative N/V ? ? ?Assessment & Plan: ? Covid vaccination; vaccinated ? ?Principal Problem: ?  Acute kidney injury superimposed on CKD ([place]) ?Active Problems: ?  Hyperkalemia ?  Essential hypertension ?  Chronic hyponatremia ?  Acute blood loss anemia ?  Hemorrhagic shock ([place]) ?  Acute hypoxemic respiratory failure ([place]) ?  Shock liver ?  Elevated liver enzymes ? ?Acute kidney injury superimposed on CKD ([facility]) (baseline of 2.4-2.5) ?-Pt has known hx of urethral stricture with chronic bladder outlet obstruction and chronic B hydronephrosis ?-Urology consulted.  s/p foley placement, to remain in until f/u with Urology as outpt ?Lab Results  ?Component Value Date  ? CREATININE 2.59 (H) 03/26/2021  ? CREATININE 3.42 (H) 03/25/2021  ? CREATININE 3.74 (H) 03/24/2021  ? CREATININE 3.91 (H) 03/23/2021  ? CREATININE 4.21 (H) 03/23/2021  ? ?Urosepsis/positive Proteus mirabilis ?-3/15 patient meets criteria for sepsis WBC> 12K,  RR> 20, HR> 90, ?-Given patient's urethral stricture and chronic outlet obstruction treat as complicated UTI.  Complete 14 days of antibiotics ? ?Essential HTN/Hypotension ?-Hold all BP medication.  Secondary to hemorrhagic shock. ?- 3/13 off pressors  ? ?Acute hypoxemic respiratory failure ([place]) ?-Baseline on room air ?-CXR reviewed, clear, VQ neg ?-3/12, required 6LNC likely secondary to marked anemia ?-3/15 resolved ? ?Hemorrhagic shock ([place]) ?-3/12, SBP into the 70's in setting of large GI bleed  ?-PCCM consulted. Required brief pressor support, now off pressors as of 3/13 ?  ?Acute blood loss anemia ?-On 3/12, noted to have large dark bloody BM with subsequent hypotension,Hgb down to 4.9. ?-3/12 transfuse 3 units PRBC  ?-3/13 s/p EGD findings of multiple ulcers without active bleed ?-Protonix 40 mg  BID ?Lab Results  ?Component Value Date  ? HGB 8.3 (L) 03/26/2021  ? HGB 7.8 (L) 03/25/2021  ? HGB 8.8 (L) 03/24/2021  ? HGB 9.3 (L) 03/23/2021  ? HGB 8.2 (L) 03/23/2021  ?-Stable ? ?Shock liver/elevated liver enzymes ?- 3/15 elevated liver enzymes most likely secondary to patient's hemorrhagic shock however will also check acute hepatitis panel ?-Enzymes still have not peaked. ? ?Hyperkalemia ?-Likely secondary to renal failure ?-3/15, resolved  ? ?Chronic hyponatremia (baseline~130 to 133) ?-Improved with IVF ?-Asymptomatic  ? ? ?DVT prophylaxis: SCD ?Code Status: Full ?Family Communication: 3/15 daughter and son-in-law at bedside for discussion of plan of care all questions answered ?Status is: Inpatient ? ? ? ?Dispo: The  patient is from: Home ?             Anticipated d/c is to: Home ?             Anticipated d/c date is: 3 days ?             Patient currently is not medically stable to d/c. ? ? ? ? ? ?Consultants:  ?GI ?PCCM ? ? ?Procedures/Significant Events:  ?Renal US with findings suggesting chronic B hydronephrosis  ? ? ?I have personally reviewed and interpreted all radiology studies and my findings are  as above. ? ?VENTILATOR SETTINGS: ? ? ? ?Cultures ?3/14 urine positive Proteus mirabilis ? ? ? ?Antimicrobials: ?Anti-infectives (From admission, onward)  ? ? Start     Ordered Stop  ? 03/26/21 1000  cefTRIAXone (ROCEPHIN) 2 g in sodium chloride 0.9 % 100 mL IVPB       ? 03/25/21 1324    ? 03/25/21 1400  cefTRIAXone (ROCEPHIN) 1 g in sodium chloride 0.9 % 100 mL IVPB       ? 03/25/21 1324 03/25/21 1409  ? 03/25/21 0830  cefTRIAXone (ROCEPHIN) 1 g in sodium chloride 0.9 % 100 mL IVPB  Status:  Discontinued       ? 03/25/21 0734 03/25/21 1329  ? ?  ?  ? ? ?Devices ?  ? ?LINES / TUBES:  ? ? ? ? ?Continuous Infusions: ? sodium chloride Stopped (03/25/21 1148)  ? cefTRIAXone (ROCEPHIN)  IV Stopped (03/26/21 1055)  ? lactated ringers Stopped (03/26/21 1017)  ? ? ? ?Objective: ?Vitals:  ? 03/26/21 1500 03/26/21 1600 03/26/21 1708 03/26/21 1714  ?BP: (!) 115/59 115/63 (!) 101/57   ?Pulse: (!) 120 (!) 120 (!) 122 (!) 119  ?Resp: 17 (!) 23 (!) 21   ?Temp:  99 ?F (37.2 ?C)    ?TempSrc:  Oral    ?SpO2: 99% 98% 98%   ?Weight:      ?Height:      ? ? ?Intake/Output Summary (Last 24 hours) at 03/26/2021 1739 ?Last data filed at 03/26/2021 1654 ?Gross per 24 hour  ?Intake 1384.51 ml  ?Output 3275 ml  ?Net -1890.49 ml  ? ?Filed Weights  ? 03/19/21 1648  ?Weight: 70.3 kg  ? ? ?Examination: ? ?General: A/O x4 No acute respiratory distress ?Eyes: negative scleral hemorrhage, negative anisocoria, negative icterus ?ENT: Negative Runny nose, negative gingival bleeding, ?Neck:  Negative scars, masses, torticollis, lymphadenopathy, JVD ?Lungs: Clear to auscultation bilaterally without wheezes or crackles ?Cardiovascular: Regular rate and rhythm without murmur gallop or rub normal S1 and S2 ?Abdomen: negative abdominal pain, nondistended, positive soft, bowel sounds, no rebound, no ascites, no appreciable mass ?Extremities: No significant cyanosis, clubbing, or edema bilateral lower extremities ?Skin: Negative rashes, lesions,  ulcers ?Psychiatric:  Negative depression, negative anxiety, negative fatigue, negative mania  ?Central nervous system:  Cranial nerves II through XII intact, tongue/uvula midline, all extremities muscle strength 5/5, sensation intact throughout, negative dysarthria, negative expressive aphasia, negative receptive aphasia. ? ?.  ? ? ? ?Data Reviewed: Care during the described time interval was provided by me .  I have reviewed this patient's available data, including medical history, events of note, physical examination, and all test results as part of my evaluation.  ? ?CBC: ?Recent Labs  ?Lab 03/19/21 ?1737 03/20/21 ?0405 03/22/21 ?0175 03/23/21 ?0448 03/23/21 ?1424 03/23/21 ?1951 03/24/21 ?0025 03/25/21 ?1025 03/26/21 ?0234  ?WBC 9.8   < > 13.6* 28.8*  --   --  24.5* 23.1*  32.0*  ?NEUTROABS 6.5  --   --   --   --   --   --   --   --   ?HGB 12.0*   < > 7.1* 4.9* 8.2* 9.3* 8.8* 7.8* 8.3*  ?HCT 37.8*   < > 22.1* 15.4* 24.0* 28.4* 26.3* 23.2* 25.5*  ?MCV 95.5   < > 96.1 98.1  --   --  88.6 89.2 90.7  ?PLT 365   < > 256 282  --   --  231 206 257  ? < > = values in this interval not displayed.  ? ?Basic Metabolic Panel: ?Recent Labs  ?Lab 03/23/21 ?0448 03/23/21 ?1637 03/24/21 ?0025 03/25/21 ?8413 03/26/21 ?0234  ?NA 130* 134* 137 133* 133*  ?K 5.6* 4.4 4.7 4.5 4.2  ?CL 108 112* 112* 108 106  ?CO2 14* 15* 16* 16* 16*  ?GLUCOSE 139* 100* 108* 115* 112*  ?BUN 103* 97* 95* 70* 51*  ?CREATININE 4.21* 3.91* 3.74* 3.42* 2.59*  ?CALCIUM 7.1* 6.9* 7.3* 7.3* 7.6*  ? ?GFR: ?Estimated Creatinine Clearance: 15.4 mL/min (A) (by C-G formula based on SCr of 2.59 mg/dL (H)). ?Liver Function Tests: ?Recent Labs  ?Lab 03/22/21 ?2440 03/23/21 ?0448 03/24/21 ?0025 03/25/21 ?1027 03/26/21 ?0234  ?AST 99* 102* 36 60* 123*  ?ALT 142* 144* 102* 94* 144*  ?ALKPHOS 25 36 64 40 34  ?BILITOT 0.1* 0.2* 0.4 0.3 0.2*  ?PROT 5.0* 4.8* 5.1* 5.2* 6.0*  ?ALBUMIN 2.0* 2.0* 2.1* 2.3* 2.5*  ? ?Recent Labs  ?Lab 03/19/21 ?1737  ?LIPASE 32  ? ?No results for  input(s): AMMONIA in the last 168 hours. ?Coagulation Profile: ?Recent Labs  ?Lab 03/19/21 ?1737 03/23/21 ?1637  ?INR 1.2 1.2  ? ?Cardiac Enzymes: ?Recent Labs  ?Lab 03/19/21 ?1737  ?CKTOTAL 74  ? ?BNP (last 3 results)

## 2021-03-26 NOTE — Progress Notes (Signed)
Pt c/o 4/10 discomfort/irritation in penis around catheter. Urine has developed sediment. Yellow in color. Pt afebrile. Notified Urology MD MacDiarmid. Per MD, will continue to monitor since pt urine cx collected yesterday, pt afebrile, and on has been started on IV abx.  ?

## 2021-03-27 ENCOUNTER — Inpatient Hospital Stay (HOSPITAL_COMMUNITY): Payer: PPO

## 2021-03-27 DIAGNOSIS — R6521 Severe sepsis with septic shock: Secondary | ICD-10-CM

## 2021-03-27 DIAGNOSIS — N179 Acute kidney failure, unspecified: Secondary | ICD-10-CM | POA: Diagnosis not present

## 2021-03-27 DIAGNOSIS — J9601 Acute respiratory failure with hypoxia: Secondary | ICD-10-CM | POA: Diagnosis not present

## 2021-03-27 DIAGNOSIS — A419 Sepsis, unspecified organism: Secondary | ICD-10-CM | POA: Diagnosis not present

## 2021-03-27 DIAGNOSIS — R7989 Other specified abnormal findings of blood chemistry: Secondary | ICD-10-CM | POA: Diagnosis not present

## 2021-03-27 DIAGNOSIS — N189 Chronic kidney disease, unspecified: Secondary | ICD-10-CM | POA: Diagnosis not present

## 2021-03-27 DIAGNOSIS — D62 Acute posthemorrhagic anemia: Secondary | ICD-10-CM | POA: Diagnosis not present

## 2021-03-27 DIAGNOSIS — A498 Other bacterial infections of unspecified site: Secondary | ICD-10-CM

## 2021-03-27 LAB — COMPREHENSIVE METABOLIC PANEL
ALT: 242 U/L — ABNORMAL HIGH (ref 0–44)
AST: 206 U/L — ABNORMAL HIGH (ref 15–41)
Albumin: 2.3 g/dL — ABNORMAL LOW (ref 3.5–5.0)
Alkaline Phosphatase: 104 U/L (ref 38–126)
Anion gap: 10 (ref 5–15)
BUN: 47 mg/dL — ABNORMAL HIGH (ref 8–23)
CO2: 19 mmol/L — ABNORMAL LOW (ref 22–32)
Calcium: 7.8 mg/dL — ABNORMAL LOW (ref 8.9–10.3)
Chloride: 103 mmol/L (ref 98–111)
Creatinine, Ser: 3.35 mg/dL — ABNORMAL HIGH (ref 0.61–1.24)
GFR, Estimated: 16 mL/min — ABNORMAL LOW (ref >60)
Glucose, Bld: 117 mg/dL — ABNORMAL HIGH (ref 70–99)
Potassium: 4.5 mmol/L (ref 3.5–5.1)
Sodium: 132 mmol/L — ABNORMAL LOW (ref 135–145)
Total Bilirubin: 0.3 mg/dL (ref 0.3–1.2)
Total Protein: 6.1 g/dL — ABNORMAL LOW (ref 6.5–8.1)

## 2021-03-27 LAB — CBC WITH DIFFERENTIAL/PLATELET
Abs Immature Granulocytes: 1.38 10*3/uL — ABNORMAL HIGH (ref 0.00–0.07)
Basophils Absolute: 0.1 10*3/uL (ref 0.0–0.1)
Basophils Relative: 0 %
Eosinophils Absolute: 0.1 10*3/uL (ref 0.0–0.5)
Eosinophils Relative: 0 %
HCT: 25.7 % — ABNORMAL LOW (ref 39.0–52.0)
Hemoglobin: 8.3 g/dL — ABNORMAL LOW (ref 13.0–17.0)
Immature Granulocytes: 4 %
Lymphocytes Relative: 5 %
Lymphs Abs: 1.8 10*3/uL (ref 0.7–4.0)
MCH: 29.7 pg (ref 26.0–34.0)
MCHC: 32.3 g/dL (ref 30.0–36.0)
MCV: 92.1 fL (ref 80.0–100.0)
Monocytes Absolute: 1.7 10*3/uL — ABNORMAL HIGH (ref 0.1–1.0)
Monocytes Relative: 5 %
Neutro Abs: 32.8 10*3/uL — ABNORMAL HIGH (ref 1.7–7.7)
Neutrophils Relative %: 86 %
Platelets: 287 10*3/uL (ref 150–400)
RBC: 2.79 MIL/uL — ABNORMAL LOW (ref 4.22–5.81)
RDW: 16.8 % — ABNORMAL HIGH (ref 11.5–15.5)
WBC: 37.8 10*3/uL — ABNORMAL HIGH (ref 4.0–10.5)
nRBC: 0 % (ref 0.0–0.2)

## 2021-03-27 LAB — GLUCOSE, CAPILLARY
Glucose-Capillary: 160 mg/dL — ABNORMAL HIGH (ref 70–99)
Glucose-Capillary: 172 mg/dL — ABNORMAL HIGH (ref 70–99)
Glucose-Capillary: 160 mg/dL — ABNORMAL HIGH (ref 70–99)

## 2021-03-27 LAB — BLOOD GAS, ARTERIAL
Acid-base deficit: 8.6 mmol/L — ABNORMAL HIGH (ref 0.0–2.0)
Bicarbonate: 15.3 mmol/L — ABNORMAL LOW (ref 20.0–28.0)
FIO2: 60 %
MECHVT: 510 mL
O2 Saturation: 98.2 %
PEEP: 5 cmH2O
Patient temperature: 38.6
RATE: 24 resp/min
pCO2 arterial: 29 mmHg — ABNORMAL LOW (ref 32–48)
pH, Arterial: 7.34 — ABNORMAL LOW (ref 7.35–7.45)
pO2, Arterial: 183 mmHg — ABNORMAL HIGH (ref 83–108)

## 2021-03-27 LAB — HEPATITIS PANEL, ACUTE
HCV Ab: NONREACTIVE
Hep A IgM: NONREACTIVE
Hep B C IgM: NONREACTIVE
Hepatitis B Surface Ag: NONREACTIVE

## 2021-03-27 LAB — LIPASE, BLOOD: Lipase: 41 U/L (ref 11–51)

## 2021-03-27 LAB — PHOSPHORUS: Phosphorus: 4.2 mg/dL (ref 2.5–4.6)

## 2021-03-27 LAB — LACTIC ACID, PLASMA: Lactic Acid, Venous: 2.1 mmol/L (ref 0.5–1.9)

## 2021-03-27 LAB — MAGNESIUM: Magnesium: 1.5 mg/dL — ABNORMAL LOW (ref 1.7–2.4)

## 2021-03-27 LAB — URINE CULTURE: Culture: >=100000 — AB

## 2021-03-27 LAB — PROCALCITONIN: Procalcitonin: 1.13 ng/mL

## 2021-03-27 MED ORDER — MAGNESIUM SULFATE 50 % IJ SOLN: 3.0000 g | Freq: Once | INTRAVENOUS | Status: DC

## 2021-03-27 MED ORDER — SIMVASTATIN 20 MG PO TABS
20.0000 mg | ORAL_TABLET | Freq: Every day | ORAL | Status: DC
  Administered 2021-03-28 – 2021-04-02 (×6): 20 mg
  Filled 2021-03-27 (×2): qty 1
  Filled 2021-03-27 (×3): qty 2
  Filled 2021-03-27: qty 1

## 2021-03-27 MED ORDER — ETOMIDATE 2 MG/ML IV SOLN
10.0000 mg | Freq: Once | INTRAVENOUS | Status: AC
Start: 2021-03-27 — End: 2021-03-27
  Administered 2021-03-27: 10 mg via INTRAVENOUS

## 2021-03-27 MED ORDER — MIDAZOLAM HCL 2 MG/2ML IJ SOLN: 2.0000 mg | Freq: Once | INTRAMUSCULAR | Status: AC

## 2021-03-27 MED ORDER — INSULIN ASPART 100 UNIT/ML IJ SOLN
0.0000 [IU] | INTRAMUSCULAR | Status: DC
  Administered 2021-03-27 (×2): 3 [IU] via SUBCUTANEOUS
  Administered 2021-03-28 – 2021-03-29 (×3): 2 [IU] via SUBCUTANEOUS
  Administered 2021-03-29: 3 [IU] via SUBCUTANEOUS
  Administered 2021-03-29 (×2): 2 [IU] via SUBCUTANEOUS
  Administered 2021-03-29: 3 [IU] via SUBCUTANEOUS
  Administered 2021-03-30 (×2): 2 [IU] via SUBCUTANEOUS
  Administered 2021-03-31: 3 [IU] via SUBCUTANEOUS
  Administered 2021-04-01 (×2): 2 [IU] via SUBCUTANEOUS

## 2021-03-27 MED ORDER — ADENOSINE 6 MG/2ML IV SOLN
6.0000 mg | Freq: Once | INTRAVENOUS | Status: AC
  Administered 2021-03-27: 6 mg via INTRAVENOUS

## 2021-03-27 MED ORDER — MAGNESIUM SULFATE 4 GM/100ML IV SOLN
4.0000 g | Freq: Once | INTRAVENOUS | Status: AC
  Administered 2021-03-27: 4 g via INTRAVENOUS
  Filled 2021-03-27: qty 100

## 2021-03-27 MED ORDER — ROCURONIUM BROMIDE 10 MG/ML (PF) SYRINGE: 60.0000 mg | PREFILLED_SYRINGE | Freq: Once | INTRAVENOUS | Status: AC

## 2021-03-27 MED ORDER — ADENOSINE 6 MG/2ML IV SOLN
INTRAVENOUS | Status: AC
Start: 2021-03-27 — End: 2021-03-27
  Administered 2021-03-27: 6 mg via INTRAVENOUS
  Filled 2021-03-27: qty 4

## 2021-03-27 MED ORDER — FENTANYL 2500MCG IN NS 250ML (10MCG/ML) PREMIX INFUSION
25.0000 ug/h | INTRAVENOUS | Status: DC
  Administered 2021-03-27: 25 ug/h via INTRAVENOUS
  Filled 2021-03-27: qty 250

## 2021-03-27 MED ORDER — FENTANYL CITRATE (PF) 100 MCG/2ML IJ SOLN
25.0000 ug | INTRAMUSCULAR | Status: DC | PRN
  Administered 2021-03-27 (×2): 50 ug via INTRAVENOUS
  Filled 2021-03-27 (×3): qty 2

## 2021-03-27 MED ORDER — MIDAZOLAM HCL 2 MG/2ML IJ SOLN
INTRAMUSCULAR | Status: AC
  Administered 2021-03-27: 2 mg via INTRAVENOUS
  Filled 2021-03-27: qty 2

## 2021-03-27 MED ORDER — FENTANYL CITRATE (PF) 100 MCG/2ML IJ SOLN: 100.0000 ug | Freq: Once | INTRAMUSCULAR | Status: AC

## 2021-03-27 MED ORDER — CHLORHEXIDINE GLUCONATE 0.12% ORAL RINSE (MEDLINE KIT)
15.0000 mL | Freq: Two times a day (BID) | OROMUCOSAL | Status: DC
  Administered 2021-03-27 – 2021-03-28 (×3): 15 mL via OROMUCOSAL

## 2021-03-27 MED ORDER — SODIUM CHLORIDE 0.9 % IV BOLUS
1000.0000 mL | Freq: Once | INTRAVENOUS | Status: AC
  Administered 2021-03-27: 500 mL via INTRAVENOUS

## 2021-03-27 MED ORDER — PHENYLEPHRINE 40 MCG/ML (10ML) SYRINGE FOR IV PUSH (FOR BLOOD PRESSURE SUPPORT)
400.0000 ug | PREFILLED_SYRINGE | Freq: Once | INTRAVENOUS | Status: AC | PRN
  Administered 2021-03-27: 400 ug via INTRAVENOUS

## 2021-03-27 MED ORDER — DEXMEDETOMIDINE HCL IN NACL 200 MCG/50ML IV SOLN
0.0000 ug/kg/h | INTRAVENOUS | Status: DC
  Administered 2021-03-27 (×2): 0.5 ug/kg/h via INTRAVENOUS
  Administered 2021-03-27: 0.4 ug/kg/h via INTRAVENOUS
  Administered 2021-03-27 – 2021-03-28 (×3): 0.5 ug/kg/h via INTRAVENOUS
  Filled 2021-03-27 (×6): qty 50

## 2021-03-27 MED ORDER — GABAPENTIN 250 MG/5ML PO SOLN
200.0000 mg | Freq: Every day | ORAL | Status: DC
  Administered 2021-03-27 – 2021-03-28 (×2): 200 mg
  Filled 2021-03-27 (×2): qty 4

## 2021-03-27 MED ORDER — FENTANYL CITRATE (PF) 100 MCG/2ML IJ SOLN
INTRAMUSCULAR | Status: AC
  Administered 2021-03-27: 100 ug via INTRAVENOUS
  Filled 2021-03-27: qty 2

## 2021-03-27 MED ORDER — SODIUM CHLORIDE 0.9 % IV SOLN: 250.0000 mL | INTRAVENOUS | Status: DC

## 2021-03-27 MED ORDER — MIDAZOLAM HCL 2 MG/2ML IJ SOLN
2.0000 mg | INTRAMUSCULAR | Status: DC | PRN
  Administered 2021-03-27: 2 mg via INTRAVENOUS
  Filled 2021-03-27: qty 2

## 2021-03-27 MED ORDER — POLYETHYLENE GLYCOL 3350 17 G PO PACK: 17.0000 g | PACK | Freq: Every day | ORAL | Status: DC

## 2021-03-27 MED ORDER — FENTANYL BOLUS VIA INFUSION
25.0000 ug | INTRAVENOUS | Status: DC | PRN
  Administered 2021-03-27: 50 ug via INTRAVENOUS
  Administered 2021-03-27: 25 ug via INTRAVENOUS
  Administered 2021-03-28: 50 ug via INTRAVENOUS
  Filled 2021-03-27: qty 100

## 2021-03-27 MED ORDER — FENTANYL CITRATE (PF) 100 MCG/2ML IJ SOLN
25.0000 ug | Freq: Once | INTRAMUSCULAR | Status: AC
  Administered 2021-03-27: 25 ug via INTRAVENOUS
  Filled 2021-03-27: qty 2

## 2021-03-27 MED ORDER — ROCURONIUM BROMIDE 10 MG/ML (PF) SYRINGE
PREFILLED_SYRINGE | INTRAVENOUS | Status: AC
  Administered 2021-03-27: 60 mg via INTRAVENOUS
  Filled 2021-03-27: qty 10

## 2021-03-27 MED ORDER — METOPROLOL TARTRATE 5 MG/5ML IV SOLN
INTRAVENOUS | Status: AC
Start: 2021-03-27 — End: 2021-03-27
  Filled 2021-03-27: qty 5

## 2021-03-27 MED ORDER — FENTANYL CITRATE (PF) 100 MCG/2ML IJ SOLN
50.0000 ug | Freq: Once | INTRAMUSCULAR | Status: AC
  Administered 2021-03-27: 50 ug via INTRAVENOUS

## 2021-03-27 MED ORDER — ALBUTEROL SULFATE (2.5 MG/3ML) 0.083% IN NEBU
INHALATION_SOLUTION | RESPIRATORY_TRACT | Status: AC
  Filled 2021-03-27: qty 3

## 2021-03-27 MED ORDER — ACETAMINOPHEN 325 MG PO TABS
650.0000 mg | ORAL_TABLET | Freq: Four times a day (QID) | ORAL | Status: DC | PRN
  Administered 2021-03-28 – 2021-03-30 (×2): 650 mg
  Filled 2021-03-27 (×2): qty 2

## 2021-03-27 MED ORDER — NOREPINEPHRINE 4 MG/250ML-% IV SOLN
0.0000 ug/min | INTRAVENOUS | Status: DC
  Administered 2021-03-27: 16 ug/min via INTRAVENOUS
  Administered 2021-03-27: 5 ug/min via INTRAVENOUS
  Filled 2021-03-27 (×2): qty 250

## 2021-03-27 MED ORDER — ADENOSINE 6 MG/2ML IV SOLN: 6.0000 mg | Freq: Once | INTRAVENOUS | Status: AC

## 2021-03-27 MED ORDER — ETOMIDATE 2 MG/ML IV SOLN
INTRAVENOUS | Status: AC
  Filled 2021-03-27: qty 20

## 2021-03-27 MED ORDER — PHENYLEPHRINE 40 MCG/ML (10ML) SYRINGE FOR IV PUSH (FOR BLOOD PRESSURE SUPPORT)
PREFILLED_SYRINGE | INTRAVENOUS | Status: DC
Start: 2021-03-27 — End: 2021-03-27
  Filled 2021-03-27: qty 20

## 2021-03-27 MED ORDER — NOREPINEPHRINE 4 MG/250ML-% IV SOLN
INTRAVENOUS | Status: AC
  Administered 2021-03-27: 4 mg
  Filled 2021-03-27: qty 250

## 2021-03-27 MED ORDER — RACEPINEPHRINE HCL 2.25 % IN NEBU
INHALATION_SOLUTION | RESPIRATORY_TRACT | Status: AC
  Filled 2021-03-27: qty 0.5

## 2021-03-27 MED ORDER — FENTANYL CITRATE (PF) 100 MCG/2ML IJ SOLN: 25.0000 ug | INTRAMUSCULAR | Status: DC | PRN

## 2021-03-27 MED ORDER — NOREPINEPHRINE 4 MG/250ML-% IV SOLN: 0.0000 ug/min | INTRAVENOUS | Status: DC

## 2021-03-27 MED ORDER — ORAL CARE MOUTH RINSE
15.0000 mL | OROMUCOSAL | Status: DC
  Administered 2021-03-27 – 2021-03-28 (×11): 15 mL via OROMUCOSAL

## 2021-03-27 NOTE — Consult Note (Addendum)
? ?NAME:  Jesus Kim, MRN:  [id number], DOB:  05/28/1924, LOS: 8 ?ADMISSION DATE:  03/19/2021, CONSULTATION DATE: 03/27/2021 ?REFERRING MD: Dr. Sherral Hammers, CHIEF COMPLAINT: Acute respiratory distress ? ?History of Present Illness:  ?Jesus Kim is a 88 year old male with a past medical history significant for colon cancer, hyperlipidemia, hypertension, melanoma, and arthritis who presented initially 03/19/2021 after being told by PCP to present to ED after abnormal lab work.  Patient reported fatigue in the last several days and presented to PCP for work-up. ? ?On presentation to the ED was found to have significant AKI with lab work; sodium 130, potassium 5.3, creatinine 4.58, GFR 11, AST 107, ALT 46, hemoglobin 12.0 ? ?Pertinent  Medical History  ?Colon cancer ?Hyperlipidemia ?Hypertension ?Melanoma ?Arthritis ? ?Significant Hospital Events: ?Including procedures, antibiotic start and stop dates in addition to other pertinent events   ?3/8 presented after PCP recommended ED evaluation due to abnormal labs seen with severe AKI on presentation ?3/12 patient seen severely hypotensive in the setting of new severe GI bleed  ?3/15 patient is seen with signs of sepsis ? ?Interim History / Subjective:  ?As above ? ?Objective   ?Blood pressure (!) 112/55, pulse (!) 118, temperature 98 ?F (36.7 ?C), temperature source Oral, resp. rate (!) 21, height '5\' 6"'  (1.676 m), weight 70.3 kg, SpO2 100 %. ?   ?Vent Mode: PRVC ?FiO2 (%):  [60 %] 60 % ?Set Rate:  [24 bmp] 24 bmp ?Vt Set:  [510 mL] 510 mL ?PEEP:  [5 cmH20] 5 cmH20 ?Plateau Pressure:  [15 cmH20] 15 cmH20  ? ?Intake/Output Summary (Last 24 hours) at 03/27/2021 0955 ?Last data filed at 03/27/2021 0800 ?Gross per 24 hour  ?Intake 443.67 ml  ?Output 2650 ml  ?Net -2206.33 ml  ? ?Filed Weights  ? 03/19/21 1648  ?Weight: 70.3 kg  ? ? ?Examination: ?General: Acute on chronic ill-appearing elderly gentleman lying in bed in severe respiratory distress prior to intubation ?HEENT: ETT, MM  pink/moist, PERRL,  ?Neuro: Alert and able to mentate prior to intubation, now sedated on ventilator ?CV: s1s2 regular rate and rhythm, no murmur, rubs, or gallops,  ?PULM: Severely decreased air entry bilaterally stridorous respirations, improved post intubation ?GI: soft, bowel sounds active in all 4 quadrants, non-tender, non-distended ?Extremities: warm/dry, no edema  ?Skin: no rashes or lesions ? ?Resolved Hospital Problem list   ? ? ?Assessment & Plan:  ?Acute Respiratory distress with impending hypoxia   ?-Called emergently to patient's bedside due to severe respiratory distress, emergently intubated at bedside ?P: ?Continue ventilator support with lung protective strategies  ?Wean PEEP and FiO2 for sats greater than 90%. ?Head of bed elevated 30 degrees. ?Plateau pressures less than 30 cm H20.  ?Follow intermittent chest x-ray and ABG.   ?SAT/SBT as tolerated, mentation preclude extubation  ?Ensure adequate pulmonary hygiene  ?Follow cultures  ?VAP bundle in place  ?PAD protocol ? ?Acute kidney injury superimposed on CKD stage 4 ?-Creatinine 2.12, GFR November 2021 ?Urethral stricture with chronic bladder outlet obstruction and type B hydronephrosis ?P: ?Urology consulted and Foley placed, Foley is to remain in place until outpatient follow-up ?Creatinine initially down trended to 2.59 but has up trended again to 3.35 on 3/16 ?Follow renal function  ?Monitor urine output ?Trend Bmet ?Avoid nephrotoxins ?Ensure adequate renal perfusion  ? ?Urosepsis with acute concern for progression of infection  ?-On 3/15 patient met criteria for sepsis with eventual culture data that revealed positive Proteus mirabillis ?-Per chart review it appears patient has  been persistently tachycardic and mildly tachypneic for several days ?P: ?Continue IV ceftriaxone (started 1/02), given complicated UTI we will plan for 14 days of therapy ?Check lactic acid  ?Repeat blood cultures  ? ?Hemorrhagic shock in the setting of lower GI  bleed  ?Acute blood los anemia  ?-Hemoglobin dropped as low as 4.9, EGD 3/13 revealed gastric and duodenal ulcers ?P: ?GI following, appreciate assistance ?Continue IV Protonix per GI ?Continue to trend hemoglobin and hematocrit ?Transfuse per protocol ?Hemoglobin goal greater than 7 ? ?Shock liver/elevated liver enzymes  ?-Liver enzymes had been downtrending but by the morning of 3/16 AST and ALT had both increased again ?-Lover ultrasound 3/15 with gallbladder sludge  ?P: ?Gi following  ?Trend LFTs ?Hepatitis panel pending ?Consider abdominal ultrasound ? ?Chronic hyponatremia  ?-Chart review reveals sodium levels usually range in the mid to low 30s ?P: ?Trend to be met ? ?Tachycardia ?History of HTN/HLD ?-Post intubation patient was seen with persistent narrow complex tachycardia with a heart rate in the 160s therefore 2 doses of 6 mg IV adenosine was administered with no response.  Therefore 5 mg IV Lopressor was given with slight decrease in heart rate ?P: ?Continuous telemetry ?Continue as needed IV beta-blocker ?Repeat twelve-lead EKG ? ?Best Practice (right click and "Reselect all SmartList Selections" daily)  ? ?Diet/type: NPO ?DVT prophylaxis: SCD ?GI prophylaxis: PPI ?Lines: N/A ?Foley:  N/A ?Code Status:  full code ?Last date of multidisciplinary goals of care discussion: See sperate Plan of care note ? ?Labs   ?CBC: ?Recent Labs  ?Lab 03/23/21 ?0448 03/23/21 ?1424 03/23/21 ?1951 03/24/21 ?0025 03/25/21 ?5852 03/26/21 ?0234 03/27/21 ?0242  ?WBC 28.8*  --   --  24.5* 23.1* 32.0* 37.8*  ?NEUTROABS  --   --   --   --   --   --  32.8*  ?HGB 4.9*   < > 9.3* 8.8* 7.8* 8.3* 8.3*  ?HCT 15.4*   < > 28.4* 26.3* 23.2* 25.5* 25.7*  ?MCV 98.1  --   --  88.6 89.2 90.7 92.1  ?PLT 282  --   --  231 206 257 287  ? < > = values in this interval not displayed.  ? ? ?Basic Metabolic Panel: ?Recent Labs  ?Lab 03/23/21 ?1637 03/24/21 ?0025 03/25/21 ?7782 03/26/21 ?0234 03/27/21 ?0242  ?NA 134* 137 133* 133* 132*  ?K 4.4 4.7  4.5 4.2 4.5  ?CL 112* 112* 108 106 103  ?CO2 15* 16* 16* 16* 19*  ?GLUCOSE 100* 108* 115* 112* 117*  ?BUN 97* 95* 70* 51* 47*  ?CREATININE 3.91* 3.74* 3.42* 2.59* 3.35*  ?CALCIUM 6.9* 7.3* 7.3* 7.6* 7.8*  ?MG  --   --   --   --  1.5*  ?PHOS  --   --   --   --  4.2  ? ?GFR: ?Estimated Creatinine Clearance: 11.9 mL/min (A) (by C-G formula based on SCr of 3.35 mg/dL (H)). ?Recent Labs  ?Lab 03/23/21 ?1637 03/24/21 ?0025 03/24/21 ?0120 03/25/21 ?0233 03/26/21 ?0234 03/26/21 ?4235 03/26/21 ?1025 03/27/21 ?0242  ?PROCALCITON 0.88 0.77  --  0.52  --  0.52  --  1.13  ?WBC  --  24.5*  --  23.1* 32.0*  --   --  37.8*  ?LATICACIDVEN 0.7  --  0.9  --   --  0.8 1.2  --   ? ? ?Liver Function Tests: ?Recent Labs  ?Lab 03/23/21 ?0448 03/24/21 ?0025 03/25/21 ?3614 03/26/21 ?0234 03/27/21 ?0242  ?AST 102* 36 60* 123*  206*  ?ALT 144* 102* 94* 144* 242*  ?ALKPHOS 37 35 78 97 104  ?BILITOT 0.2* 0.4 0.3 0.2* 0.3  ?PROT 4.8* 5.1* 5.2* 6.0* 6.1*  ?ALBUMIN 2.0* 2.1* 2.3* 2.5* 2.3*  ? ?No results for input(s): LIPASE, AMYLASE in the last 168 hours. ?No results for input(s): AMMONIA in the last 168 hours. ? ?ABG ?   ?Component Value Date/Time  ? PHART 7.37 03/23/2021 1424  ? PCO2ART 26 (L) 03/23/2021 1424  ? PO2ART 165 (H) 03/23/2021 1424  ? HCO3 15.0 (L) 03/23/2021 1424  ? TCO2 24 01/02/2008 1026  ? ACIDBASEDEF 8.6 (H) 03/23/2021 1424  ? O2SAT 99.6 03/23/2021 1424  ?  ? ?Coagulation Profile: ?Recent Labs  ?Lab 03/23/21 ?1637  ?INR 1.2  ? ? ?Cardiac Enzymes: ?No results for input(s): CKTOTAL, CKMB, CKMBINDEX, TROPONINI in the last 168 hours. ? ?HbA1C: ?No results found for: HGBA1C ? ?CBG: ?Recent Labs  ?Lab 03/23/21 ?0635 03/23/21 ?1209  ?GLUCAP 128* 210*  ? ? ?Review of Systems:   ?Unable to assess  ? ?Past Medical History:  ?He,  has a past medical history of Arthritis, Cancer ([place]), Hyperlipidemia, Hypertension, and Melanoma ([place]).  ? ?Surgical History:  ? ?Past Surgical History:  ?Procedure Laterality Date  ? BALLOON DILATION N/A  04/02/2015  ? Procedure: BALLOON DILATION;  Surgeon: Bjorn Loser, MD;  Location: [facility];  Service: Urology;  Laterality: N/A;  ? BIOPSY  03/24/2021  ? Procedure: BIOPSY;  Surgeon: Otis Brace, MD;  Location

## 2021-03-27 NOTE — Progress Notes (Signed)
?PROGRESS NOTE ? ? ? ?Jesus Kim  YBO:[id number] DOB: 01-Jan-1925 DOA: 03/19/2021 ?PCP: [name], MD  ? ?Brief Narrative:  ?88 y.o. WM PMHx  HTN, OA, HLD, CKD 4  ? ?Presents after being called by his PCP to come to the emergency room due to abnormal lab work.  He reports he has had fatigue for the last few days and was going for his routine lab work with his PCP today.  States he has had decreased p.o. intake for the last 2 days due to his not feeling well.  He has not had any nausea, vomiting, diarrhea, fever.  He has no specific complaints and denies any chest pain, cough, shortness of breath, palpitations, confusion, urinary symptoms, muscle aches.  Lives at home with his wife.  No recent travel. ?No known sick contacts other than his wife has some mild nasal congestion. ?Was found to have AKI on repeat lab work with creatinine of 4.58 which is increased from his baseline of 2.4-2.5.  Hospitalist service was asked to admit for further management ? ? ?Subjective: ?3/16 paged to bedside patient in rigors with Tmax 38.7 ?C.  Patient alert extremely uncomfortable tachypneic using accessory muscles to breathe, stridor ? ? ?Assessment & Plan: ? Covid vaccination; vaccinated ? ?Principal Problem: ?  Acute kidney injury superimposed on CKD ([place]) ?Active Problems: ?  Hyperkalemia ?  Essential hypertension ?  Chronic hyponatremia ?  Acute blood loss anemia ?  Hemorrhagic shock ([place]) ?  Acute hypoxemic respiratory failure ([place]) ?  Shock liver ?  Elevated liver enzymes ? ?Acute kidney injury superimposed on CKD ([facility]) (baseline of 2.4-2.5) ?-Pt has known hx of urethral stricture with chronic bladder outlet obstruction and chronic B hydronephrosis ?-Urology consulted.  s/p foley placement, to remain in until f/u with Urology as outpt ?Lab Results  ?Component Value Date  ? CREATININE 3.35 (H) 03/27/2021  ? CREATININE 2.59 (H) 03/26/2021  ? CREATININE 3.42 (H) 03/25/2021  ? CREATININE 3.74 (H) 03/24/2021  ? CREATININE 3.91  (H) 03/23/2021  ? ?Severe Urosepsis/positive Proteus mirabilis ?-3/15 patient meets criteria for sepsis WBC> 12K, RR> 20, HR> 90, ?-Given patient's urethral stricture and chronic outlet obstruction treat as complicated UTI.  Complete 14 days of antibiotics ?-3/16 lactic acid= 2.1, continue to trend ?- 3/16 procalcitonin 1.13 continue to trend ? ?Essential HTN/Hypotension ?-Hold all BP medication.  Secondary to hemorrhagic shock. ?- 3/13 off pressors  ? ?Sinus tachycardia vs A-fib with RVR ?- 3/16 EKG pending ? ?Acute hypoxemic respiratory failure ([place]) ?-Baseline on room air ?-CXR reviewed, clear, VQ neg ?-3/12, required 6LNC likely secondary to marked anemia ?-3/16 treated with racemic epi without improvement and patient's respiratory status ?-3/16 stridor, tachypneic spoke with Dr. Halford Chessman PCCM we decided to intubate patient in order to allow him to rest. ? ?Hemorrhagic shock ([place]) ?-3/12, SBP into the 70's in setting of large GI bleed  ?-PCCM consulted. Required brief pressor support, now off pressors as of 3/13 ?  ?Acute blood loss anemia ?-On 3/12, noted to have large dark bloody BM with subsequent hypotension,Hgb down to 4.9. ?-3/12 transfuse 3 units PRBC  ?-3/13 s/p EGD findings of multiple ulcers without active bleed ?-Protonix 40 mg  BID ?Lab Results  ?Component Value Date  ? HGB 8.3 (L) 03/27/2021  ? HGB 8.3 (L) 03/26/2021  ? HGB 7.8 (L) 03/25/2021  ? HGB 8.8 (L) 03/24/2021  ? HGB 9.3 (L) 03/23/2021  ?-Stable ? ?Shock liver/elevated liver enzymes ?- 3/15 elevated liver enzymes most likely  secondary to patient's hemorrhagic shock however will also check acute hepatitis panel ?- 3/16 overnight patient's hepatic status has decompensated.  Unsure of cause, there was no hepatic toxic medication, negative hypotension.   ? ?Hyperkalemia ?-Likely secondary to renal failure ?-3/15, resolved  ? ?Hypomagnesmia ?- Magnesium goal> 2 ?- 3/16 magnesium IV 3 g ? ?Chronic hyponatremia (baseline~130 to 133) ?-Improved with  IVF ?-Asymptomatic  ? ? ?DVT prophylaxis: SCD ?Code Status: Full ?Family Communication: 3/16 daughter and son-in-law at bedside for discussion of plan of care all questions answered ?Status is: Inpatient ? ? ? ?Dispo: The patient is from: Home ?             Anticipated d/c is to: Home ?             Anticipated d/c date is: 3 days ?             Patient currently is not medically stable to d/c. ? ? ? ? ? ?Consultants:  ?GI ?PCCM ? ? ?Procedures/Significant Events:  ?Renal US with findings suggesting chronic B hydronephrosis  ?3/16 intubated ? ? ?I have personally reviewed and interpreted all radiology studies and my findings are as above. ? ?VENTILATOR SETTINGS: ? ? ? ?Cultures ?3/14 urine positive Proteus mirabilis ?3/14 blood RIGHT AC NGTD ?3/14 blood LEFT hand NGTD ?3/16 blood pending ? ? ?Antimicrobials: ?Anti-infectives (From admission, onward)  ? ? Start     Ordered Stop  ? 03/26/21 1000  cefTRIAXone (ROCEPHIN) 2 g in sodium chloride 0.9 % 100 mL IVPB       ? 03/25/21 1324    ? 03/25/21 1400  cefTRIAXone (ROCEPHIN) 1 g in sodium chloride 0.9 % 100 mL IVPB       ? 03/25/21 1324 03/25/21 1409  ? 03/25/21 0830  cefTRIAXone (ROCEPHIN) 1 g in sodium chloride 0.9 % 100 mL IVPB  Status:  Discontinued       ? 03/25/21 0734 03/25/21 1329  ? ?  ?  ? ? ?Devices ?  ? ?LINES / TUBES:  ? ? ? ? ?Continuous Infusions: ? sodium chloride Stopped (03/25/21 1148)  ? cefTRIAXone (ROCEPHIN)  IV Stopped (03/26/21 1055)  ? lactated ringers Stopped (03/26/21 1017)  ? ? ? ?Objective: ?Vitals:  ? 03/27/21 0500 03/27/21 0600 03/27/21 0700 03/27/21 0750  ?BP: (!) 118/52 128/64 (!) 112/55   ?Pulse: (!) 111 (!) 120 (!) 118   ?Resp: (!) 21 (!) 28 (!) 21   ?Temp:    98 ?F (36.7 ?C)  ?TempSrc:    Oral  ?SpO2: 100% 99% 97%   ?Weight:      ?Height:      ? ? ?Intake/Output Summary (Last 24 hours) at 03/27/2021 0911 ?Last data filed at 03/27/2021 0800 ?Gross per 24 hour  ?Intake 443.67 ml  ?Output 2650 ml  ?Net -2206.33 ml  ? ? ?Filed Weights  ?  03/19/21 1648  ?Weight: 70.3 kg  ? ? ?Examination: ? ?General: A/O x4 positive acute respiratory distress, rigors ?Eyes: negative scleral hemorrhage, negative anisocoria, negative icterus ?ENT: Negative Runny nose, negative gingival bleeding, ?Neck:  Negative scars, masses, torticollis, lymphadenopathy, JVD ?Lungs: tachypneic decreased breath sounds bilaterally, with stridor  ?Cardiovascular: Tachycardic without murmur gallop or rub normal S1 and S2 ?Abdomen: negative abdominal pain, nondistended, positive soft, bowel sounds, no rebound, no ascites, no appreciable mass ?Extremities: No significant cyanosis, clubbing, or edema bilateral lower extremities ?Skin: Negative rashes, lesions, ulcers ?Psychiatric:  Negative depression, negative anxiety, negative fatigue, negative mania  ?Central  nervous system:  Cranial nerves II through XII intact, tongue/uvula midline, all extremities muscle strength 5/5, sensation intact throughout, negative dysarthria, negative expressive aphasia, negative receptive aphasia. ? ?.  ? ? ? ?Data Reviewed: Care during the described time interval was provided by me .  I have reviewed this patient's available data, including medical history, events of note, physical examination, and all test results as part of my evaluation.  ? ?CBC: ?Recent Labs  ?Lab 03/23/21 ?0448 03/23/21 ?1424 03/23/21 ?1951 03/24/21 ?0025 03/25/21 ?4650 03/26/21 ?0234 03/27/21 ?0242  ?WBC 28.8*  --   --  24.5* 23.1* 32.0* 37.8*  ?NEUTROABS  --   --   --   --   --   --  32.8*  ?HGB 4.9*   < > 9.3* 8.8* 7.8* 8.3* 8.3*  ?HCT 15.4*   < > 28.4* 26.3* 23.2* 25.5* 25.7*  ?MCV 98.1  --   --  88.6 89.2 90.7 92.1  ?PLT 282  --   --  231 206 257 287  ? < > = values in this interval not displayed.  ? ? ?Basic Metabolic Panel: ?Recent Labs  ?Lab 03/23/21 ?1637 03/24/21 ?0025 03/25/21 ?3546 03/26/21 ?0234 03/27/21 ?0242  ?NA 134* 137 133* 133* 132*  ?K 4.4 4.7 4.5 4.2 4.5  ?CL 112* 112* 108 106 103  ?CO2 15* 16* 16* 16* 19*  ?GLUCOSE  100* 108* 115* 112* 117*  ?BUN 97* 95* 70* 51* 47*  ?CREATININE 3.91* 3.74* 3.42* 2.59* 3.35*  ?CALCIUM 6.9* 7.3* 7.3* 7.6* 7.8*  ?MG  --   --   --   --  1.5*  ?PHOS  --   --   --   --  4.2  ? ? ?GFR: ?Estimat

## 2021-03-27 NOTE — Procedures (Signed)
Central Venous Catheter Insertion Procedure Note ? ?Jesus Kim  ?[id number]  ?[birth year] ? ?Date:03/27/21  ?Time:1:14 PM  ? ?Provider Performing:Victor Granados D. Harris  ? ?Procedure: Insertion of Non-tunneled Central Venous Catheter(36556) with US guidance (59163)  ? ? ? ?Indication(s) ?Medication administration ? ?Consent ?Risks of the procedure as well as the alternatives and risks of each were explained to the patient and/or caregiver.  Consent for the procedure was obtained and is signed in the bedside chart ? ?Anesthesia ?Topical only with 1% lidocaine  ? ?Timeout ?Verified patient identification, verified procedure, site/side was marked, verified correct patient position, special equipment/implants available, medications/allergies/relevant history reviewed, required imaging and test results available. ? ?Sterile Technique ?Maximal sterile technique including full sterile barrier drape, hand hygiene, sterile gown, sterile gloves, mask, hair covering, sterile ultrasound probe cover (if used). ? ?Procedure Description ?Area of catheter insertion was cleaned with chlorhexidine and draped in sterile fashion.  With real-time ultrasound guidance a central venous catheter was placed into the left internal jugular vein. Nonpulsatile blood flow and easy flushing noted in all ports.  The catheter was sutured in place and sterile dressing applied. ? ?Complications/Tolerance ?None; patient tolerated the procedure well. ?Chest X-ray is ordered to verify placement for internal jugular or subclavian cannulation.   Chest x-ray is not ordered for femoral cannulation. ? ?EBL ?Minimal ? ?Specimen(s) ?None ? ?[name], NP-C ?[facility] ?Personal contact information can be found on Amion  ?03/27/2021, 1:15 PM ? ? ? ?

## 2021-03-27 NOTE — Progress Notes (Signed)
Pt has a central line per ICU RN, [place] PIV not needed. ?

## 2021-03-27 NOTE — Procedures (Signed)
Arterial Catheter Insertion Procedure Note ? ?Jesus Kim  ?[id number]  ?Jul 25, 1924 ? ?Date:03/27/21  ?Time:1:16 PM  ? ? ?Provider Performing: Faruq Rosenberger D. Harris  ? ? ?Procedure: Insertion of Arterial Line [phone]) with US guidance (01655)  ? ?Indication(s) ?Blood pressure monitoring and/or need for frequent ABGs ? ?Consent ?Risks of the procedure as well as the alternatives and risks of each were explained to the patient and/or caregiver.  Consent for the procedure was obtained and is signed in the bedside chart ? ?Anesthesia ?None ? ? ?Time Out ?Verified patient identification, verified procedure, site/side was marked, verified correct patient position, special equipment/implants available, medications/allergies/relevant history reviewed, required imaging and test results available. ? ? ?Sterile Technique ?Maximal sterile technique including full sterile barrier drape, hand hygiene, sterile gown, sterile gloves, mask, hair covering, sterile ultrasound probe cover (if used). ? ? ?Procedure Description ?Area of catheter insertion was cleaned with chlorhexidine and draped in sterile fashion. With real-time ultrasound guidance an arterial catheter was placed into the left radial artery.  Appropriate arterial tracings confirmed on monitor.   ? ? ?Complications/Tolerance ?None; patient tolerated the procedure well. ? ? ?EBL ?Minimal ? ? ?Specimen(s) ?None ? ? ?Jesus Kim D. Harris, NP-C ?[facility] ?Personal contact information can be found on Amion  ?03/27/2021, 1:16 PM ? ? ?

## 2021-03-27 NOTE — Progress Notes (Signed)
Pt had persistent narrow complex tachycardia with HR in 160's.  Given 6 mg adenosine x 2 w/o response.  Given 5 mg lopressor IV with HR decreasing and showing sinus tachycardia.  BP remained in normal range. ? ?Chesley Mires, MD ?[place] ?Pager - [phone] ?03/27/2021, 10:02 AM ? ?

## 2021-03-27 NOTE — Progress Notes (Signed)
PCCM progress note ? ?Called emergently to room due to acute onset of respiratory distress.  On assessment patient is seen with severe increased work of breathing with accessory muscle use.  He sounds stridorous with very little air movement on auscultation.  He reports subjective severe dyspnea.  Patient emergently intubated at bedside.  Full consult note to follow ? ?Marquite Attwood D. Harris, NP-C ?[facility] ?Personal contact information can be found on Amion  ?03/27/2021, 9:53 AM ? ? ?

## 2021-03-27 NOTE — Plan of Care (Signed)
?  Interdisciplinary Goals of Care Family Meeting ? ? ?Date carried out: 03/27/2021 this is the first [place] discussion with family  ? ?Location of the meeting: Unit ? ?Member's involved: Nurse Practitioner and Bedside Registered Nurse ? ?Durable Power of Tour manager: Shared among daughters    ? ?Discussion: We discussed goals of care for Jesus Kim .  I spoke with patients daughter as the medical team was preparing for intubation secondary to acute respiratory distress. I relayed to the family that Jesus Kim was alert and oriented prior to intubation and that he did agree to procedure. The daughter also all agreed to proceed with intubation.  ? ?As of now family wishes to provide all aggressive measure to support Jesus Kim with hopes of recovery. If quality of life starts to become impaired family would like to reassess plan for aggressive interventions.   ? ?Code status: Full Code ? ?Disposition: Continue current acute care ? ? ?Time spent for the meeting: 25 ? ?Skie Vitrano D. Harris ?03/27/2021, 11:21 AM ? ?

## 2021-03-27 NOTE — Progress Notes (Signed)
[facility] Progress Note ? ?Jesus Kim 88 y.o. [birth year] ? ?CC: GI bleed ? ? ?Subjective: ?Recent events noted.  Patient currently intubated for respiratory failure.  Also looks like having worsening urosepsis and narrow complex tachycardia. ? ?ROS : Not able to obtain ? ? ?Objective: ?Vital signs in last 24 hours: ?Vitals:  ? 03/27/21 0750 03/27/21 0940  ?BP:    ?Pulse:    ?Resp:    ?Temp: 98 ?F (36.7 ?C)   ?SpO2:  100%  ? ? ?Physical Exam: ? ?General -elderly patient, intubated. ?Abdomen -soft, nontender, nondistended, bowel sounds present.  No peritoneal sign ? ?Lab Results: ?Recent Labs  ?  03/26/21 ?0234 03/27/21 ?6010  ?NA 133* 132*  ?K 4.2 4.5  ?CL 106 103  ?CO2 16* 19*  ?GLUCOSE 112* 117*  ?BUN 51* 47*  ?CREATININE 2.59* 3.35*  ?CALCIUM 7.6* 7.8*  ?MG  --  1.5*  ?PHOS  --  4.2  ? ?Recent Labs  ?  03/26/21 ?0234 03/27/21 ?0242  ?AST 123* 206*  ?ALT 144* 242*  ?ALKPHOS 63 104  ?BILITOT 0.2* 0.3  ?PROT 6.0* 6.1*  ?ALBUMIN 2.5* 2.3*  ? ?Recent Labs  ?  03/26/21 ?0234 03/27/21 ?0242  ?WBC 32.0* 37.8*  ?NEUTROABS  --  32.8*  ?HGB 8.3* 8.3*  ?HCT 25.5* 25.7*  ?MCV 90.7 92.1  ?PLT 257 287  ? ?No results for input(s): LABPROT, INR in the last 72 hours. ? ? ? ? ?Assessment/Plan: ?-GI bleed with melena.  Hemoglobin was down to 4.9.  EGD on March 24, 2021 showed gastric and duodenal ulcers.  Clean-based.  No active bleeding.  Gastric biopsies negative for H. pylori. ?-Acute blood loss anemia.  S/p blood transfusion ?-Respiratory failure.  Intubated now. ?-Urosepsis ?-Remote history of colon cancer.  Last colonoscopy scope in 2011 was normal. ?-Abnormal LFTs.  Chronic.  Normal INR.  Normal platelets.  Ultrasound showed gallbladder sludge but no acute changes.  Elevated LFTs could be from underlying sepsis. ?  ?Recommendation ?------------------------- ?-Continue IV twice daily PPI for now ?-Repeat LFTs in the morning ?-GI will follow ? ?Otis Brace MD, FACP ?03/27/2021, 11:35 AM ? ?Contact #   [phone]  ?

## 2021-03-27 NOTE — Procedures (Signed)
Intubation Procedure Note ? ?Jesus Kim  ?[id number]  ?June 29, 1924 ? ?Date:03/27/21  ?Time:9:53 AM  ? ?Provider Performing:Jesus Kim  ? ? ?Procedure: Intubation (80998) ? ?Indication(s) ?Respiratory Failure ? ?Consent ?Risks of the procedure as well as the alternatives and risks of each were explained to the patient and/or caregiver.  Consent for the procedure was obtained and is signed in the bedside chart ? ? ?Anesthesia ?Etomidate, Versed, Fentanyl, and Rocuronium ? ? ?Time Out ?Verified patient identification, verified procedure, site/side was marked, verified correct patient position, special equipment/implants available, medications/allergies/relevant history reviewed, required imaging and test results available. ? ? ?Sterile Technique ?Usual hand hygeine, masks, and gloves were used ? ? ?Procedure Description ?Patient positioned in bed supine.  Sedation given as noted above.  Patient was intubated with endotracheal tube using Glidescope.  View was Grade 1 full glottis .  Number of attempts was 1.  Colorimetric CO2 detector was consistent with tracheal placement. ? ? ?Complications/Tolerance ?None; patient tolerated the procedure well. ?Chest X-ray is ordered to verify placement. ? ? ?EBL ?Minimal ? ? ?Specimen(s) ?None ? ? ?Jesus Frede D. Harris, NP-C ?[facility] ?Personal contact information can be found on Amion  ?03/27/2021, 9:54 AM ? ? ?

## 2021-03-28 ENCOUNTER — Inpatient Hospital Stay (HOSPITAL_COMMUNITY): Payer: PPO

## 2021-03-28 DIAGNOSIS — N179 Acute kidney failure, unspecified: Secondary | ICD-10-CM | POA: Diagnosis not present

## 2021-03-28 DIAGNOSIS — N189 Chronic kidney disease, unspecified: Secondary | ICD-10-CM | POA: Diagnosis not present

## 2021-03-28 LAB — CBC WITH DIFFERENTIAL/PLATELET
Abs Immature Granulocytes: 1.1 10*3/uL — ABNORMAL HIGH (ref 0.00–0.07)
Basophils Absolute: 0.1 10*3/uL (ref 0.0–0.1)
Basophils Relative: 0 %
Eosinophils Absolute: 0.2 10*3/uL (ref 0.0–0.5)
Eosinophils Relative: 1 %
HCT: 24.7 % — ABNORMAL LOW (ref 39.0–52.0)
Hemoglobin: 8.1 g/dL — ABNORMAL LOW (ref 13.0–17.0)
Immature Granulocytes: 3 %
Lymphocytes Relative: 2 %
Lymphs Abs: 0.9 10*3/uL (ref 0.7–4.0)
MCH: 30.2 pg (ref 26.0–34.0)
MCHC: 32.8 g/dL (ref 30.0–36.0)
MCV: 92.2 fL (ref 80.0–100.0)
Monocytes Absolute: 1.3 10*3/uL — ABNORMAL HIGH (ref 0.1–1.0)
Monocytes Relative: 4 %
Neutro Abs: 33.2 10*3/uL — ABNORMAL HIGH (ref 1.7–7.7)
Neutrophils Relative %: 90 %
Platelets: 317 10*3/uL (ref 150–400)
RBC: 2.68 MIL/uL — ABNORMAL LOW (ref 4.22–5.81)
RDW: 16.8 % — ABNORMAL HIGH (ref 11.5–15.5)
WBC: 36.8 10*3/uL — ABNORMAL HIGH (ref 4.0–10.5)
nRBC: 0 % (ref 0.0–0.2)

## 2021-03-28 LAB — LACTIC ACID, PLASMA: Lactic Acid, Venous: 1.9 mmol/L (ref 0.5–1.9)

## 2021-03-28 LAB — COMPREHENSIVE METABOLIC PANEL
ALT: 202 U/L — ABNORMAL HIGH (ref 0–44)
AST: 125 U/L — ABNORMAL HIGH (ref 15–41)
Albumin: 2 g/dL — ABNORMAL LOW (ref 3.5–5.0)
Alkaline Phosphatase: 145 U/L — ABNORMAL HIGH (ref 38–126)
Anion gap: 10 (ref 5–15)
BUN: 42 mg/dL — ABNORMAL HIGH (ref 8–23)
CO2: 17 mmol/L — ABNORMAL LOW (ref 22–32)
Calcium: 7.6 mg/dL — ABNORMAL LOW (ref 8.9–10.3)
Chloride: 106 mmol/L (ref 98–111)
Creatinine, Ser: 2.78 mg/dL — ABNORMAL HIGH (ref 0.61–1.24)
GFR, Estimated: 20 mL/min — ABNORMAL LOW (ref >60)
Glucose, Bld: 106 mg/dL — ABNORMAL HIGH (ref 70–99)
Potassium: 4.6 mmol/L (ref 3.5–5.1)
Sodium: 133 mmol/L — ABNORMAL LOW (ref 135–145)
Total Bilirubin: 0.3 mg/dL (ref 0.3–1.2)
Total Protein: 5.8 g/dL — ABNORMAL LOW (ref 6.5–8.1)

## 2021-03-28 LAB — GLUCOSE, CAPILLARY
Glucose-Capillary: 111 mg/dL — ABNORMAL HIGH (ref 70–99)
Glucose-Capillary: 129 mg/dL — ABNORMAL HIGH (ref 70–99)
Glucose-Capillary: 141 mg/dL — ABNORMAL HIGH (ref 70–99)
Glucose-Capillary: 90 mg/dL (ref 70–99)
Glucose-Capillary: 92 mg/dL (ref 70–99)
Glucose-Capillary: 99 mg/dL (ref 70–99)

## 2021-03-28 LAB — PHOSPHORUS: Phosphorus: 4.4 mg/dL (ref 2.5–4.6)

## 2021-03-28 LAB — MAGNESIUM: Magnesium: 2.3 mg/dL (ref 1.7–2.4)

## 2021-03-28 LAB — PROCALCITONIN: Procalcitonin: 46.31 ng/mL

## 2021-03-28 MED ORDER — LEVALBUTEROL HCL 0.63 MG/3ML IN NEBU
INHALATION_SOLUTION | RESPIRATORY_TRACT | Status: AC
Start: 2021-03-28 — End: 2021-03-28
  Filled 2021-03-28: qty 3

## 2021-03-28 MED ORDER — PIPERACILLIN-TAZOBACTAM 3.375 G IVPB
3.3750 g | Freq: Three times a day (TID) | INTRAVENOUS | Status: DC
  Administered 2021-03-28: 3.375 g via INTRAVENOUS
  Filled 2021-03-28: qty 50

## 2021-03-28 MED ORDER — PIPERACILLIN-TAZOBACTAM IN DEX 2-0.25 GM/50ML IV SOLN
2.2500 g | Freq: Three times a day (TID) | INTRAVENOUS | Status: DC
  Administered 2021-03-28 – 2021-04-01 (×11): 2.25 g via INTRAVENOUS
  Filled 2021-03-28 (×12): qty 50

## 2021-03-28 MED ORDER — LIP MEDEX EX OINT
TOPICAL_OINTMENT | CUTANEOUS | Status: DC | PRN
  Filled 2021-03-28: qty 7

## 2021-03-28 MED ORDER — METOPROLOL TARTRATE 5 MG/5ML IV SOLN: 2.5000 mg | Freq: Once | INTRAVENOUS | Status: AC

## 2021-03-28 MED ORDER — METHYLPREDNISOLONE SODIUM SUCC 125 MG IJ SOLR
60.0000 mg | Freq: Once | INTRAMUSCULAR | Status: AC
  Administered 2021-03-28: 60 mg via INTRAVENOUS

## 2021-03-28 MED ORDER — POLYETHYLENE GLYCOL 3350 17 G PO PACK
17.0000 g | PACK | Freq: Two times a day (BID) | ORAL | Status: DC
  Administered 2021-03-28: 17 g
  Filled 2021-03-28: qty 1

## 2021-03-28 MED ORDER — METOPROLOL TARTRATE 5 MG/5ML IV SOLN
INTRAVENOUS | Status: AC
  Filled 2021-03-28: qty 5

## 2021-03-28 MED ORDER — ORAL CARE MOUTH RINSE
15.0000 mL | Freq: Two times a day (BID) | OROMUCOSAL | Status: DC
  Administered 2021-03-28 – 2021-04-05 (×10): 15 mL via OROMUCOSAL

## 2021-03-28 MED ORDER — NOREPINEPHRINE 4 MG/250ML-% IV SOLN
0.0000 ug/min | INTRAVENOUS | Status: DC
  Administered 2021-03-28 (×2): 7 ug/min via INTRAVENOUS
  Filled 2021-03-28 (×2): qty 250

## 2021-03-28 MED ORDER — LEVALBUTEROL HCL 0.63 MG/3ML IN NEBU
0.6300 mg | INHALATION_SOLUTION | Freq: Four times a day (QID) | RESPIRATORY_TRACT | Status: DC | PRN
  Administered 2021-03-28: 0.63 mg via RESPIRATORY_TRACT

## 2021-03-28 MED ORDER — METHYLPREDNISOLONE SODIUM SUCC 125 MG IJ SOLR
INTRAMUSCULAR | Status: AC
  Filled 2021-03-28: qty 2

## 2021-03-28 MED ORDER — CHLORHEXIDINE GLUCONATE 0.12 % MT SOLN
15.0000 mL | Freq: Two times a day (BID) | OROMUCOSAL | Status: DC
  Administered 2021-03-28 – 2021-04-06 (×13): 15 mL via OROMUCOSAL
  Filled 2021-03-28 (×10): qty 15

## 2021-03-28 NOTE — Procedures (Signed)
Extubation Procedure Note ? ?Patient Details:   ?Name: Jesus Kim ?DOB: 30-Sep-1924 ?MRN: [id number] ?  ?Airway Documentation:  ?  ?Vent end date: 03/28/21 Vent end time: 1145  ? ?Evaluation ? O2 sats: stable throughout ?Complications: No apparent complications ?Patient did tolerate procedure well. ?Bilateral Breath Sounds: Clear, Diminished ?  ?Yes ? ?Pt extubated to 4L [employer] with no complications noted. Pt was suctioned prior to extubation and had a positive cuff leak present. Pt was able to speak afterwards and no stridor is heard at this time. Family now at bedside. Vitals are stable at this time, RT will continue to monitor.  ? ?Linell Shawn A Kareen Hitsman ?03/28/2021, 11:46 AM ? ?

## 2021-03-28 NOTE — Evaluation (Signed)
Clinical/Bedside Swallow Evaluation ?Patient Details  ?Name: Jesus Kim ?MRN: [id number] ?Date of Birth: 07/18/1924 ? ?Today's Date: 03/28/2021 ?Time: SLP Start Time (ACUTE ONLY): 7001 SLP Stop Time (ACUTE ONLY): 1630 ?SLP Time Calculation (min) (ACUTE ONLY): 15 min ? ?Past Medical History:  ?Past Medical History:  ?Diagnosis Date  ? Arthritis   ? Cancer [facility])   ? Colon  ? Hyperlipidemia   ? Hypertension   ? Melanoma ([place])   ? ?Past Surgical History:  ?Past Surgical History:  ?Procedure Laterality Date  ? BALLOON DILATION N/A 04/02/2015  ? Procedure: BALLOON DILATION;  Surgeon: Bjorn Loser, MD;  Location: [facility];  Service: Urology;  Laterality: N/A;  ? BIOPSY  03/24/2021  ? Procedure: BIOPSY;  Surgeon: Otis Brace, MD;  Location: [facility];  Service: Gastroenterology;;  ? CATARACT EXTRACTION    ? COLON SURGERY    ? 1981 - colon cancer  ? CYSTOSCOPY WITH RETROGRADE PYELOGRAM, URETEROSCOPY AND STENT PLACEMENT N/A 04/02/2015  ? Procedure: CYSTO WITH RETROGRADE ;  Surgeon: Bjorn Loser, MD;  Location: [facility];  Service: Urology;  Laterality: N/A;  ? ESOPHAGOGASTRODUODENOSCOPY (EGD) WITH PROPOFOL N/A 03/24/2021  ? Procedure: ESOPHAGOGASTRODUODENOSCOPY (EGD) WITH PROPOFOL;  Surgeon: Otis Brace, MD;  Location: [facility];  Service: Gastroenterology;  Laterality: N/A;  ? Left thub surgery    ? TONSILLECTOMY    ? URETHRAL STRICTURE DILATATION    ? ?HPI:  ?Jesus Kim is a 88 year old male with a past medical history significant for colon cancer, hyperlipidemia, hypertension, melanoma, and arthritis who presented initially 03/19/2021 after being told by PCP to present to ED after abnormal lab work.  Dx severe AKI. 3/12 severe hypotension in setting of GI bleed, sepsis, intubated 3/16 due to acute resp distress, extubated 3/17.  ?  ?Assessment / Plan / Recommendation  ?Clinical Impression ? Pt presented with concerns for dysphagia with impaired coordination of respiratory and swallowing cycles and increased  WOB and tachycardia as assessment progressed. Thin liquids elicited immediate cough response; honey thick liquids were consumed with mild throat-clearing; purees swallowed without incident.  Pt attentive to POs, expressing his desire to eat, however, as brief exam progressed, s/s of dysphagia became more evident. Recommend continuing NPO; allow ice chips if VS stabilize. D/W RN; she called critical care to pt's room. SLP will follow for readiness. ?SLP Visit Diagnosis: Dysphagia, unspecified (R13.10) ?   ?Aspiration Risk ? Moderate aspiration risk  ?  ?Diet Recommendation   NPO - allow ice chips if VS stabilize ? ?   ?  ?Other  Recommendations Oral Care Recommendations: Oral care QID;Oral care prior to ice chip/H20   ? ?Recommendations for follow up therapy are one component of a multi-disciplinary discharge planning process, led by the attending physician.  Recommendations may be updated based on patient status, additional functional criteria and insurance authorization. ? ?Follow up Recommendations Other (comment) (tba)  ? ? ?  ?Assistance Recommended at Discharge    ?Functional Status Assessment    ?Frequency and Duration    ?  ?  ?   ? ?Prognosis    ? ?  ? ?Swallow Study   ?General HPI: Jesus Kim is a 88 year old male with a past medical history significant for colon cancer, hyperlipidemia, hypertension, melanoma, and arthritis who presented initially 03/19/2021 after being told by PCP to present to ED after abnormal lab work.  Dx severe AKI. 3/12 severe hypotension in setting of GI bleed, sepsis, intubated 3/16 due to acute resp distress, extubated 3/17. ?Type  of Study: Bedside Swallow Evaluation ?Previous Swallow Assessment: no ?Diet Prior to this Study: NPO ?Temperature Spikes Noted: No ?Respiratory Status: Nasal cannula ?History of Recent Intubation: Yes ?Length of Intubations (days): 1 days ?Date extubated: 03/28/21 ?Behavior/Cognition: Alert;Cooperative;Pleasant mood ?Oral Cavity Assessment: Within  Functional Limits ?Oral Care Completed by SLP: No ?Oral Cavity - Dentition: Adequate natural dentition ?Vision: Functional for self-feeding ?Self-Feeding Abilities: Needs assist;Able to feed self ?Patient Positioning: Upright in bed ?Baseline Vocal Quality: Normal ?Volitional Cough: Strong ?Volitional Swallow: Able to elicit  ?  ?Oral/Motor/Sensory Function Overall Oral Motor/Sensory Function: Within functional limits   ?Ice Chips Ice chips: Within functional limits   ?Thin Liquid Thin Liquid: Impaired ?Presentation: Cup ?Pharyngeal  Phase Impairments: Cough - Immediate  ?  ?Nectar Thick Nectar Thick Liquid: Not tested   ?Honey Thick Honey Thick Liquid: Impaired ?Presentation: Cup ?Pharyngeal Phase Impairments: Throat Clearing - Immediate   ?Puree Puree: Within functional limits   ?Solid ? ? ?  Solid: Not tested  ? ?  ? ?Jesus Kim ?03/28/2021,4:40 PM ? ? ?Jesus Debellis L. Arriyana Rodell, MA CCC/SLP ?Acute [facility] Services ?Office number [phone] ?Pager [phone] ? ?

## 2021-03-28 NOTE — Progress Notes (Signed)
RT NOTE: ? ?Pt placed on BiPAP per CCMD order due to increased WOB. Pt states right now he feels okay on the BiPAP. Vitals are stable at this time, RN is aware. RT will continue to monitor pt status.  ?

## 2021-03-28 NOTE — TOC Progression Note (Signed)
Transition of Care (TOC) - Progression Note  ? ? ?Patient Details  ?Name: Jesus Kim ?MRN: [id number] ?Date of Birth: 1924/04/10 ? ?Transition of Care (TOC) CM/SW Contact  ?Jacques Fife, LCSW ?Phone Number: ?03/28/2021, 10:39 AM ? ?Clinical Narrative:    ?TOC continues to follow/ monitor progression. Of note, [facility] has accepted pt for any HH needs when pt ready for dc. ? ? ?Expected Discharge Plan: [place] ?Barriers to Discharge: Continued Medical Work up ? ?Expected Discharge Plan and Services ?Expected Discharge Plan: [place] ?In-house Referral: Clinical Social Work ?  ?  ?Living arrangements for the past 2 months: Apartment ?                ?  ?  ?  ?  ?  ?  ?  ?  ?  ?  ? ? ?Social Determinants of Health (SDOH) Interventions ?  ? ?Readmission Risk Interventions ?No flowsheet data found. ? ?

## 2021-03-28 NOTE — Progress Notes (Signed)
? ?NAME:  Jesus Kim, MRN:  [id number], DOB:  01-Jul-1924, LOS: 9 ?ADMISSION DATE:  03/19/2021, CONSULTATION DATE: 03/27/2021 ?REFERRING MD: Dr. Sherral Hammers, CHIEF COMPLAINT: Acute respiratory distress ? ?History of Present Illness:  ?Jesus Kim is a 88 year old male with a past medical history significant for colon cancer, hyperlipidemia, hypertension, melanoma, and arthritis who presented initially 03/19/2021 after being told by PCP to present to ED after abnormal lab work.  Patient reported fatigue in the last several days and presented to PCP for work-up. ? ?On presentation to the ED was found to have significant AKI with lab work; sodium 130, potassium 5.3, creatinine 4.58, GFR 11, AST 107, ALT 46, hemoglobin 12.0 ? ?Pertinent  Medical History  ?Colon cancer ?Hyperlipidemia ?Hypertension ?Melanoma ?Arthritis ? ?Significant Hospital Events: ?Including procedures, antibiotic start and stop dates in addition to other pertinent events   ?3/8 presented after PCP recommended ED evaluation due to abnormal labs seen with severe AKI on presentation ?3/12 patient seen severely hypotensive in the setting of new severe GI bleed  ?3/15 patient is seen with signs of sepsis ?3/16 Intubated emergently due to acute respiratory distress  ?3/17 alert and mentating on vent, tolerating SBT well currently  ? ?Interim History / Subjective:  ?Alert and interactive on vent, using written communication  ? ?Objective   ?Blood pressure (!) 124/58, pulse (!) 106, temperature 98.2 ?F (36.8 ?C), temperature source Axillary, resp. rate 18, height _0  (1.676 m), weight 70.3 kg, SpO2 98 %. ?   ?Vent Mode: PSV;CPAP ?FiO2 (%):  [30 %-60 %] 30 % ?Set Rate:  [18 bmp-24 bmp] 18 bmp ?Vt Set:  [510 mL] 510 mL ?PEEP:  [5 cmH20] 5 cmH20 ?Pressure Support:  [8 cmH20] 8 cmH20 ?Plateau Pressure:  [10 cmH20-18 cmH20] 12 cmH20  ? ?Intake/Output Summary (Last 24 hours) at 03/28/2021 0831 ?Last data filed at 03/28/2021 0700 ?Gross per 24 hour  ?Intake 2356.73 ml   ?Output 2050 ml  ?Net 306.73 ml  ? ? ?Filed Weights  ? 03/19/21 1648  ?Weight: 70.3 kg  ? ? ?Examination: ?General: Pleasant elderly male lying in bed on mechanical ventilation, in NAD ?HEENT: ETT, MM pink/moist, PERRL,  ?Neuro: Alert and interactive on vent, non-focal ?CV: s1s2 regular rate and rhythm, no murmur, rubs, or gallops,  ?PULM:  Clear to ascultation, no increased work of breathing, no added breath sounds ?GI: soft, bowel sounds active in all 4 quadrants, non-tender, non-distended ?Extremities: warm/dry, no edema  ?Skin: no rashes or lesions ? ?Resolved Hospital Problem list   ? ? ?Assessment & Plan:  ?Acute Respiratory distress with impending hypoxia   ?-Called emergently to patient's bedside due to severe respiratory distress, emergently intubated at bedside ?P: ?Tolerating SBT well currently, hoping to extubate today  ?Confirmed with patient that he would want to be re-intubated if he were to need it  ?Continue ventilator support with lung protective strategies  ?Wean PEEP and FiO2 for sats greater than 90%. ?Head of bed elevated 30 degrees. ?Plateau pressures less than 30 cm H20.  ?Follow intermittent chest x-ray and ABG.   ?SAT/SBT as tolerated, mentation preclude extubation  ?Ensure adequate pulmonary hygiene  ?Follow cultures  ?VAP bundle in place  ?PAD protocol ? ?Acute kidney injury superimposed on CKD stage 4 ?-Creatinine 2.12, GFR November 2021 ?Urethral stricture with chronic bladder outlet obstruction and type B hydronephrosis ?P: ?Foley placed per Urology, not to be removed until seen in the outpatient setting  ?Creatinine trended back down to 2.78 ?Follow renal  function  ?Monitor urine output ?Trend Bmet ?Avoid nephrotoxins ?Ensure adequate renal perfusion  ? ?Urosepsis with acute concern for progression of infection  ?-On 3/15 patient met criteria for sepsis with eventual culture data that revealed positive Proteus mirabillis ?-Per chart review it appears patient has been persistently  tachycardic and mildly tachypneic for several days ?P: ?Continue IV Ceftriaxone, plan to cover for a total of 14 days  ?Follow repeat blood culture  ?Follow fever curve and CBC  ? ?Hemorrhagic shock in the setting of lower GI bleed  ?Acute blood los anemia  ?-Hemoglobin dropped as low as 4.9, EGD 3/13 revealed gastric and duodenal ulcers ?P: ?GI following ?Continue PPI  ?Trend CBC ?Monitor for signs of bleeding  ?Hgb goal > 7 ? ?Shock liver/elevated liver enzymes  ?-Liver enzymes had been downtrending but by the morning of 3/16 AST and ALT had both increased again ?-Lover ultrasound 3/15 with gallbladder sludge  ?-Hepatitis panel negative  ?P: ?GI following  ?Trend LFTs, slightly improved today  ? ?Chronic hyponatremia  ?-Chart review reveals sodium levels usually range in the mid to low 30s ?P: ?Trend bmet  ? ?Tachycardia ?History of HTN/HLD ?-Post intubation patient was seen with persistent narrow complex tachycardia with a heart rate in the 160s therefore 2 doses of 6 mg IV adenosine was administered with no response.  Therefore 5 mg IV Lopressor was given with slight decrease in heart rate ?P: ?Continuous telemetry  ?Continue as needed beta blocker  ? ?Best Practice (right click and "Reselect all SmartList Selections" daily)  ? ?Diet/type: NPO ?DVT prophylaxis: SCD ?GI prophylaxis: PPI ?Lines: N/A ?Foley:  N/A ?Code Status:  full code ?Last date of multidisciplinary goals of care discussion: See sperate Plan of care note ? ?Critical care time: 38 min  ?Jesus Kehm D. Harris, NP-C ?[facility] ?Personal contact information can be found on Amion  ?03/28/2021, 8:31 AM ? ? ? ? ? ? ?

## 2021-03-28 NOTE — Progress Notes (Signed)
Called to bedside to evaluate patient with increased work of breathing ? ?Had just had swallowing evaluation ? ?He says he feels a little tired, mildly tachypneic, he is tachycardic in the 130s ? ?He does have some wheezes ? ?We will give Xopenex every 6 as needed, 60 mg of Solu-Medrol ?Lopressor 2.5 ? ?BiPAP as backup ? ?We will continue to monitor closely ? ?There is a possibility that he may be aspirating, will follow closely ?

## 2021-03-28 NOTE — Progress Notes (Signed)
PHARMACY NOTE:  ANTIMICROBIAL RENAL DOSAGE ADJUSTMENT ? ?Indication: sepsis/UTI ? ?Renal Function: ?Estimated Creatinine Clearance: 14.3 mL/min (A) (by C-G formula based on SCr of 2.78 mg/dL (H)). ?      ? ?Antimicrobial dosage has been changed to:  Zosyn 3.375 mg IV x 1 dose followed by Zosyn 2.25 gm IV q8h ? ?Eudelia Bunch, Pharm.D ?03/28/2021 3:04 PM ? ?

## 2021-03-28 NOTE — Progress Notes (Signed)
[facility] Progress Note ? ?Jesus Kim 88 y.o. 29-Nov-1924 ? ?CC: GI bleed ? ? ?Subjective: ?Patient seen and examined at bedside.  Remains intubated.  Family at bedside.  Discussed with RN.  No bowel movements in last 1 to 2 days. ? ?ROS : Not able to obtain ? ? ?Objective: ?Vital signs in last 24 hours: ?Vitals:  ? 03/28/21 0830 03/28/21 0904  ?BP: (!) 107/46   ?Pulse: (!) 104   ?Resp: (!) 21   ?Temp:  (!) 101.6 ?F (38.7 ?C)  ?SpO2: 98%   ? ? ?Physical Exam: ? ?General -elderly patient, intubated. ?Abdomen -soft, nontender, nondistended, bowel sounds present.  No peritoneal sign ? ?Lab Results: ?Recent Labs  ?  03/27/21 ?0242 03/28/21 ?3009  ?NA 132* 133*  ?K 4.5 4.6  ?CL 103 106  ?CO2 19* 17*  ?GLUCOSE 117* 106*  ?BUN 47* 42*  ?CREATININE 3.35* 2.78*  ?CALCIUM 7.8* 7.6*  ?MG 1.5* 2.3  ?PHOS 4.2 4.4  ? ?Recent Labs  ?  03/27/21 ?0242 03/28/21 ?0343  ?AST 206* 125*  ?ALT 242* 202*  ?ALKPHOS 104 145*  ?BILITOT 0.3 0.3  ?PROT 6.1* 5.8*  ?ALBUMIN 2.3* 2.0*  ? ?Recent Labs  ?  03/27/21 ?0242 03/28/21 ?0343  ?WBC 37.8* 36.8*  ?NEUTROABS 32.8* 33.2*  ?HGB 8.3* 8.1*  ?HCT 25.7* 24.7*  ?MCV 92.1 92.2  ?PLT 287 317  ? ?No results for input(s): LABPROT, INR in the last 72 hours. ? ? ? ? ?Assessment/Plan: ?-GI bleed with melena.  Hemoglobin was down to 4.9.  EGD on March 24, 2021 showed gastric and duodenal ulcers.  Clean-based.  No active bleeding.  Gastric biopsies negative for H. pylori. ?-Acute blood loss anemia.  S/p blood transfusion ?-Respiratory failure.  Intubated now. ?-Urosepsis ?-Remote history of colon cancer.  Last colonoscopy scope in 2011 was normal. ?-Abnormal LFTs.  Chronic.  Normal INR.  Normal platelets.  Ultrasound showed gallbladder sludge but no acute changes.  Elevated LFTs could be from underlying sepsis. ?  ?Recommendation ?------------------------- ?-AST /ALT improving.  Normal T. bili.  Mild elevated alkaline phosphatase.  History of ?-Continue IV twice daily PPI for now ?-Repeat LFTs  in the morning ?-Increase MiraLAX to twice a day ?-GI will follow periodically. ? ?Otis Brace MD, FACP ?03/28/2021, 9:11 AM ? ?Contact #  [phone]  ?

## 2021-03-28 NOTE — Progress Notes (Signed)
RT NOTE: ? ?Pt requested to be taken off BiPAP. Pt WOB looks much better than prior to BiPAP. Pt placed on 4L [place] with saturations of 99%. RN is aware. ?

## 2021-03-28 NOTE — Progress Notes (Signed)
NUTRITION NOTE ? ?Patient screened for new vent. Patient discussed in rounds this AM and with RN. Patient has now been extubated. No plan for TF at this time.  ? ?He was previously ordered a Heart Healthy diet changed to FLD on 3/13 and to Soft on 3/15 afternoon. He was eating 75-100% while on Heart Healthy diet. ? ?Skin noted to be WDL.  ? ?Weight on 3/8 was 155 lb and weight on 09/10/20 was 160 lb. This indicates 5 lb weight loss (3.1% body weight) in the past 6.5 months; not significant for time frame. ? ?Re-weigh patient today as he has not been weighed since admission on 3/8. ? ?Patient does not screen for any other nutrition-related needs at this time. If nutrition-related needs arise, please consult RD. ? ? ? ?Jesus Matin, MS, RD, LDN ?Registered Dietitian II ?Inpatient Clinical Nutrition ?RD pager # and on-call/weekend pager # available in [place]  ? ?

## 2021-03-29 DIAGNOSIS — N179 Acute kidney failure, unspecified: Secondary | ICD-10-CM | POA: Diagnosis not present

## 2021-03-29 DIAGNOSIS — N189 Chronic kidney disease, unspecified: Secondary | ICD-10-CM | POA: Diagnosis not present

## 2021-03-29 LAB — CBC WITH DIFFERENTIAL/PLATELET
Abs Immature Granulocytes: 0.48 10*3/uL — ABNORMAL HIGH (ref 0.00–0.07)
Basophils Absolute: 0.1 10*3/uL (ref 0.0–0.1)
Basophils Relative: 0 %
Eosinophils Absolute: 0 10*3/uL (ref 0.0–0.5)
Eosinophils Relative: 0 %
HCT: 21.4 % — ABNORMAL LOW (ref 39.0–52.0)
Hemoglobin: 6.9 g/dL — CL (ref 13.0–17.0)
Immature Granulocytes: 2 %
Lymphocytes Relative: 2 %
Lymphs Abs: 0.4 10*3/uL — ABNORMAL LOW (ref 0.7–4.0)
MCH: 29.5 pg (ref 26.0–34.0)
MCHC: 32.2 g/dL (ref 30.0–36.0)
MCV: 91.5 fL (ref 80.0–100.0)
Monocytes Absolute: 0.5 10*3/uL (ref 0.1–1.0)
Monocytes Relative: 2 %
Neutro Abs: 25.2 10*3/uL — ABNORMAL HIGH (ref 1.7–7.7)
Neutrophils Relative %: 94 %
Platelets: 297 10*3/uL (ref 150–400)
RBC: 2.34 MIL/uL — ABNORMAL LOW (ref 4.22–5.81)
RDW: 16.7 % — ABNORMAL HIGH (ref 11.5–15.5)
WBC Morphology: INCREASED
WBC: 26.7 10*3/uL — ABNORMAL HIGH (ref 4.0–10.5)
nRBC: 0 % (ref 0.0–0.2)

## 2021-03-29 LAB — COMPREHENSIVE METABOLIC PANEL
ALT: 176 U/L — ABNORMAL HIGH (ref 0–44)
AST: 143 U/L — ABNORMAL HIGH (ref 15–41)
Albumin: 1.7 g/dL — ABNORMAL LOW (ref 3.5–5.0)
Alkaline Phosphatase: 141 U/L — ABNORMAL HIGH (ref 38–126)
Anion gap: 12 (ref 5–15)
BUN: 42 mg/dL — ABNORMAL HIGH (ref 8–23)
CO2: 16 mmol/L — ABNORMAL LOW (ref 22–32)
Calcium: 7.6 mg/dL — ABNORMAL LOW (ref 8.9–10.3)
Chloride: 106 mmol/L (ref 98–111)
Creatinine, Ser: 2.71 mg/dL — ABNORMAL HIGH (ref 0.61–1.24)
GFR, Estimated: 21 mL/min — ABNORMAL LOW (ref >60)
Glucose, Bld: 144 mg/dL — ABNORMAL HIGH (ref 70–99)
Potassium: 4.2 mmol/L (ref 3.5–5.1)
Sodium: 134 mmol/L — ABNORMAL LOW (ref 135–145)
Total Bilirubin: 0.5 mg/dL (ref 0.3–1.2)
Total Protein: 5 g/dL — ABNORMAL LOW (ref 6.5–8.1)

## 2021-03-29 LAB — GLUCOSE, CAPILLARY
Glucose-Capillary: 117 mg/dL — ABNORMAL HIGH (ref 70–99)
Glucose-Capillary: 117 mg/dL — ABNORMAL HIGH (ref 70–99)
Glucose-Capillary: 121 mg/dL — ABNORMAL HIGH (ref 70–99)
Glucose-Capillary: 137 mg/dL — ABNORMAL HIGH (ref 70–99)
Glucose-Capillary: 138 mg/dL — ABNORMAL HIGH (ref 70–99)
Glucose-Capillary: 141 mg/dL — ABNORMAL HIGH (ref 70–99)
Glucose-Capillary: 155 mg/dL — ABNORMAL HIGH (ref 70–99)

## 2021-03-29 LAB — HEMOGLOBIN AND HEMATOCRIT, BLOOD
HCT: 25.8 % — ABNORMAL LOW (ref 39.0–52.0)
Hemoglobin: 8.2 g/dL — ABNORMAL LOW (ref 13.0–17.0)

## 2021-03-29 LAB — PREPARE RBC (CROSSMATCH)

## 2021-03-29 LAB — MAGNESIUM: Magnesium: 2.3 mg/dL (ref 1.7–2.4)

## 2021-03-29 LAB — HEMOGLOBIN A1C
Hgb A1c MFr Bld: 5.5 % (ref 4.8–5.6)
Mean Plasma Glucose: 111 mg/dL

## 2021-03-29 LAB — PHOSPHORUS: Phosphorus: 4.3 mg/dL (ref 2.5–4.6)

## 2021-03-29 MED ORDER — SODIUM CHLORIDE 0.9% IV SOLUTION: Freq: Once | INTRAVENOUS | Status: AC

## 2021-03-29 MED ORDER — PANTOPRAZOLE SODIUM 40 MG IV SOLR
40.0000 mg | Freq: Two times a day (BID) | INTRAVENOUS | Status: DC
  Administered 2021-03-29 – 2021-04-01 (×7): 40 mg via INTRAVENOUS
  Filled 2021-03-29 (×7): qty 10

## 2021-03-29 MED ORDER — GABAPENTIN 100 MG PO CAPS
200.0000 mg | ORAL_CAPSULE | Freq: Every day | ORAL | Status: DC
Start: 2021-03-29 — End: 2021-04-06
  Administered 2021-03-29 – 2021-04-05 (×8): 200 mg via ORAL
  Filled 2021-03-29 (×8): qty 2

## 2021-03-29 MED ORDER — PANTOPRAZOLE SODIUM 40 MG PO TBEC
40.0000 mg | DELAYED_RELEASE_TABLET | Freq: Two times a day (BID) | ORAL | Status: DC
Start: 2021-03-29 — End: 2021-03-29

## 2021-03-29 MED ORDER — FINASTERIDE 5 MG PO TABS
5.0000 mg | ORAL_TABLET | Freq: Every day | ORAL | Status: DC
  Administered 2021-03-29 – 2021-04-06 (×9): 5 mg via ORAL
  Filled 2021-03-29 (×9): qty 1

## 2021-03-29 MED ORDER — FLEET ENEMA 7-19 GM/118ML RE ENEM
1.0000 | ENEMA | Freq: Once | RECTAL | Status: AC
  Administered 2021-03-29: 1 via RECTAL
  Filled 2021-03-29: qty 1

## 2021-03-29 MED ORDER — PROSIGHT PO TABS
1.0000 | ORAL_TABLET | Freq: Every day | ORAL | Status: DC
  Administered 2021-03-29 – 2021-04-06 (×9): 1 via ORAL
  Filled 2021-03-29 (×9): qty 1

## 2021-03-29 NOTE — Progress Notes (Signed)
Patient's hgb resulted at 6.9 Dr Ander Slade ordered 1unit PRBCs. Second provider Cleatrice Burke placed order for another unit of PRBCs.  ?Orders clarified by C.Woods MD he would like the patient to only receive one unit at this time. Duplicate order for PRBCs discontinued. ?

## 2021-03-29 NOTE — Progress Notes (Incomplete)
?PROGRESS NOTE ? ? ? ?Jesus Kim  XBW:[id number] DOB: 1925/01/09 DOA: 03/19/2021 ?PCP: [name], MD  ? ?Brief Narrative:  ?88 y.o. WM PMHx  HTN, OA, HLD, CKD 4  ? ?Presents after being called by his PCP to come to the emergency room due to abnormal lab work.  He reports he has had fatigue for the last few days and was going for his routine lab work with his PCP today.  States he has had decreased p.o. intake for the last 2 days due to his not feeling well.  He has not had any nausea, vomiting, diarrhea, fever.  He has no specific complaints and denies any chest pain, cough, shortness of breath, palpitations, confusion, urinary symptoms, muscle aches.  Lives at home with his wife.  No recent travel. ?No known sick contacts other than his wife has some mild nasal congestion. ?Was found to have AKI on repeat lab work with creatinine of 4.58 which is increased from his baseline of 2.4-2.5.  Hospitalist service was asked to admit for further management ? ? ?Subjective: ?3/18 afebrile overnight ? ? ? ? paged to bedside patient in rigors with Tmax 38.7 ?C.  Patient alert extremely uncomfortable tachypneic using accessory muscles to breathe, stridor ? ? ?Assessment & Plan: ? Covid vaccination; vaccinated ? ?Principal Problem: ?  Acute kidney injury superimposed on CKD ([place]) ?Active Problems: ?  Hyperkalemia ?  Essential hypertension ?  Chronic hyponatremia ?  Acute blood loss anemia ?  Hemorrhagic shock ([place]) ?  Acute hypoxemic respiratory failure ([place]) ?  Shock liver ?  Elevated liver enzymes ? ?Acute kidney injury superimposed on CKD ([facility]) (baseline of 2.4-2.5)****** ?-Pt has known hx of urethral stricture with chronic bladder outlet obstruction and chronic B hydronephrosis ?-Urology consulted.  s/p foley placement, to remain in until f/u with Urology as outpt ?Lab Results  ?Component Value Date  ? CREATININE 2.71 (H) 03/29/2021  ? CREATININE 2.78 (H) 03/28/2021  ? CREATININE 3.35 (H) 03/27/2021  ? CREATININE 2.59 (H)  03/26/2021  ? CREATININE 3.42 (H) 03/25/2021  ?-3/18 sodium bicarb 39m/hr ? ?Severe Urosepsis/positive Proteus mirabilis ?-3/15 patient meets criteria for sepsis WBC> 12K, RR> 20, HR> 90, ?-Given patient's urethral stricture and chronic outlet obstruction treat as complicated UTI.  Complete 14 days of antibiotics ?-3/16 lactic acid= 2.1, continue to trend ?- 3/16 procalcitonin 1.13 continue to trend ? ?Essential HTN/Hypotension ?-Hold all BP medication.  Secondary to hemorrhagic shock. ?- 3/13 off pressors  ? ?Sinus tachycardia vs A-fib with RVR ?- 3/16 EKG pending ? ?Acute hypoxemic respiratory failure ([place] ?-Baseline on room air ?-CXR reviewed, clear, VQ neg ?-3/12, required 6LNC likely secondary to marked anemia ?-3/16 treated with racemic epi without improvement and patient's respiratory status ?-3/16 stridor, tachypneic spoke with Dr. SHalford ChessmanPCCM we decided to intubate patient in order to allow him to rest. ? ?[place] ?-3/12, SBP into the 70's in setting of large GI bleed  ?-PCCM consulted. Required brief pressor support, now off pressors as of 3/13 ?  ?Acute blood loss anemia****** ?-On 3/12, noted to have large dark bloody BM with subsequent hypotension,Hgb down to 4.9. ?-3/12 transfuse 3 units PRBC  ?-3/13 s/p EGD findings of multiple ulcers without active bleed ?-Protonix 40 mg  BID ?Lab Results  ?Component Value Date  ? HGB 6.9 (LL) 03/29/2021  ? HGB 8.1 (L) 03/28/2021  ? HGB 8.3 (L) 03/27/2021  ? HGB 8.3 (L) 03/26/2021  ? HGB 7.8 (L) 03/25/2021  ?-3/18 transfuse 1  unit PRBC ? ?Shock liver/elevated liver enzymes ?- 3/15 elevated liver enzymes most likely secondary to patient's hemorrhagic shock however will also check acute hepatitis panel ?- 3/16 overnight patient's hepatic status has decompensated.  Unsure of cause, there was no hepatic toxic medication, negative hypotension.   ? ?Hyperkalemia ?-Likely secondary to renal failure ?-3/15, resolved  ? ?Hypomagnesmia ?- Magnesium goal> 2 ?-  3/16 magnesium IV 3 g ? ?Chronic hyponatremia (baseline~130 to 133) ?-Improved with IVF ?-Asymptomatic  ? ? ?DVT prophylaxis: SCD ?Code Status: Full ?Family Communication: 3/16 daughter and son-in-law at bedside for discussion of plan of care all questions answered ?Status is: Inpatient ? ? ? ?Dispo: The patient is from: Home ?             Anticipated d/c is to: Home ?             Anticipated d/c date is: 3 days ?             Patient currently is not medically stable to d/c. ? ? ? ? ? ?Consultants:  ?GI ?PCCM ? ? ?Procedures/Significant Events:  ?Renal US with findings suggesting chronic B hydronephrosis  ?3/16 intubated ? ? ?I have personally reviewed and interpreted all radiology studies and my findings are as above. ? ?VENTILATOR SETTINGS: ?Room air 3/18 ?SPO2 98% ? ? ?Cultures ?3/14 urine positive Proteus mirabilis ?3/14 blood RIGHT AC NGTD ?3/14 blood LEFT hand NGTD ?3/16 blood pending ? ? ?Antimicrobials: ?Anti-infectives (From admission, onward)  ? ? Start     Ordered Stop  ? 03/26/21 1000  cefTRIAXone (ROCEPHIN) 2 g in sodium chloride 0.9 % 100 mL IVPB       ? 03/25/21 1324    ? 03/25/21 1400  cefTRIAXone (ROCEPHIN) 1 g in sodium chloride 0.9 % 100 mL IVPB       ? 03/25/21 1324 03/25/21 1409  ? 03/25/21 0830  cefTRIAXone (ROCEPHIN) 1 g in sodium chloride 0.9 % 100 mL IVPB  Status:  Discontinued       ? 03/25/21 0734 03/25/21 1329  ? ?  ?  ? ? ?Devices ?  ? ?LINES / TUBES:  ? ? ? ? ?Continuous Infusions: ? sodium chloride Stopped (03/28/21 1127)  ? lactated ringers 75 mL/hr at 03/29/21 0400  ? norepinephrine (LEVOPHED) Adult infusion Stopped (03/29/21 0530)  ? piperacillin-tazobactam (ZOSYN)  IV Stopped (03/29/21 0449)  ? ? ? ?Objective: ?Vitals:  ? 03/29/21 0645 03/29/21 0700 03/29/21 0800 03/29/21 0844  ?BP: (!) 99/43 (!) 97/42 (!) 107/51   ?Pulse: 74 68 85 85  ?Resp: '14 13 17 '$ (!) 22  ?Temp:   97.8 ?F (36.6 ?C)   ?TempSrc:   Axillary   ?SpO2: 100% 100% 96% 98%  ?Weight:      ?Height:      ? ? ?Intake/Output  Summary (Last 24 hours) at 03/29/2021 1001 ?Last data filed at 03/29/2021 0700 ?Gross per 24 hour  ?Intake 1858.97 ml  ?Output 1430 ml  ?Net 428.97 ml  ? ? ?Filed Weights  ? 03/19/21 1648 03/28/21 1152  ?Weight: 70.3 kg 68 kg  ? ? ?Examination: ? ?General: A/O x4 positive acute respiratory distress, rigors ?Eyes: negative scleral hemorrhage, negative anisocoria, negative icterus ?ENT: Negative Runny nose, negative gingival bleeding, ?Neck:  Negative scars, masses, torticollis, lymphadenopathy, JVD ?Lungs: tachypneic decreased breath sounds bilaterally, with stridor  ?Cardiovascular: Tachycardic without murmur gallop or rub normal S1 and S2 ?Abdomen: negative abdominal pain, nondistended, positive soft, bowel sounds, no rebound, no ascites, no  appreciable mass ?Extremities: No significant cyanosis, clubbing, or edema bilateral lower extremities ?Skin: Negative rashes, lesions, ulcers ?Psychiatric:  Negative depression, negative anxiety, negative fatigue, negative mania  ?Central nervous system:  Cranial nerves II through XII intact, tongue/uvula midline, all extremities muscle strength 5/5, sensation intact throughout, negative dysarthria, negative expressive aphasia, negative receptive aphasia. ? ?.  ? ? ? ?Data Reviewed: Care during the described time interval was provided by me .  I have reviewed this patient's available data, including medical history, events of note, physical examination, and all test results as part of my evaluation.  ? ?CBC: ?Recent Labs  ?Lab 03/25/21 ?0233 03/26/21 ?0234 03/27/21 ?0242 03/28/21 ?0343 03/29/21 ?0349  ?WBC 23.1* 32.0* 37.8* 36.8* 26.7*  ?NEUTROABS  --   --  32.8* 33.2* 25.2*  ?HGB 7.8* 8.3* 8.3* 8.1* 6.9*  ?HCT 23.2* 25.5* 25.7* 24.7* 21.4*  ?MCV 89.2 90.7 92.1 92.2 91.5  ?PLT 206 257 287 317 297  ? ? ?Basic Metabolic Panel: ?Recent Labs  ?Lab 03/25/21 ?0233 03/26/21 ?0234 03/27/21 ?0242 03/28/21 ?0343 03/29/21 ?1791  ?NA 133* 133* 132* 133* 134*  ?K 4.5 4.2 4.5 4.6 4.2  ?CL 108  106 103 106 106  ?CO2 16* 16* 19* 17* 16*  ?GLUCOSE 115* 112* 117* 106* 144*  ?BUN 70* 51* 47* 42* 42*  ?CREATININE 3.42* 2.59* 3.35* 2.78* 2.71*  ?CALCIUM 7.3* 7.6* 7.8* 7.6* 7.6*  ?MG  --   --  1.

## 2021-03-29 NOTE — Progress Notes (Signed)
Pt refused to wear bipap tonight, Pt is doing good on RA no resp distress noted. Machine remained bedside if needed.  ?

## 2021-03-29 NOTE — Progress Notes (Signed)
Patient has remained off pressors for the entirety of my 12 hour shift(7a-7p). He has been alert, oriented x3, with some confusion on time. He has not been on bipap this shift. Lungs clear, nonlabored symmetrical breathing on room air saturations are 98%.  ?Patient was approved by SLP for a diet. He was able to take his pills whole with water. No s/sx of dysphagia.  ?Fleets enema given x1 per GI resulting in a moderate dark brown-black color stool in bedpan.  ?Has not ambulating this shift. Able to turn and reposition self in bed.  ?Left IJ CVC remains in place at this time due to need for pressors in last 24 hours. ?Foley catheter remains appropriate for Urology reasons. ?

## 2021-03-29 NOTE — Progress Notes (Signed)
Speech Language Pathology Treatment: Dysphagia  ?Patient Details ?Name: Jesus Kim ?MRN: [id number] ?DOB: 11/03/1924 ?Today's Date: 03/29/2021 ?Time: 1550-1605 ?SLP Time Calculation (min) (ACUTE ONLY): 15 min ? ?Assessment / Plan / Recommendation ?Clinical Impression ? [name]'s swallow was significantly improved this afternoon.  Currently on room air and saturating mid 90s; RR well within parameters that allow timely synchrony of swallowing/breathing patterns.  He consumed regular solids, purees, and thin liquids with adequate oral attention/mastication, brisk swallow, and no s/s of aspiration.  Dysphagia appears to have resolved. Recommend resuming a regular diet with thin liquids.  No further SLP f/u is warranted.  ?  ?HPI HPI: Jesus Kim is a 88-year-old male with a past medical history significant for colon cancer, hyperlipidemia, hypertension, melanoma, and arthritis who presented initially 03/19/2021 after being told by PCP to present to ED after abnormal lab work.  Dx severe AKI. 3/12 severe hypotension in setting of GI bleed, sepsis, intubated 3/16 due to acute resp distress, extubated 3/17. ?  ?   ?SLP Plan ? All goals met ? ?  ?  ?Recommendations for follow up therapy are one component of a multi-disciplinary discharge planning process, led by the attending physician.  Recommendations may be updated based on patient status, additional functional criteria and insurance authorization. ?  ? ?Recommendations  ?Diet recommendations: Regular;Thin liquid ?Liquids provided via: Cup;Straw ?Medication Administration: Whole meds with liquid ?Supervision: Patient able to self feed  ?   ?    ?   ? ? ? ? Oral Care Recommendations: Oral care BID ?Follow Up Recommendations: No SLP follow up ?SLP Visit Diagnosis: Dysphagia, unspecified (R13.10) ?Plan: All goals met ? ? ? ? ?  ?  ? L. , MA CCC/SLP ?Acute Rehabilitation Services ?Office number [phone] ?Pager [phone] ? ? ?,   Laurice ? ?03/29/2021, 4:10 PM ?

## 2021-03-29 NOTE — Progress Notes (Signed)
? ?NAME:  Jesus Kim, MRN:  [id number], DOB:  [birth year], LOS: 10 ?ADMISSION DATE:  03/19/2021, CONSULTATION DATE: 03/27/2021 ?REFERRING MD: Dr. Sherral Hammers, CHIEF COMPLAINT: Acute respiratory distress ? ?History of Present Illness:  ?Jesus Kim is a 88 year old male with a past medical history significant for colon cancer, hyperlipidemia, hypertension, melanoma, and arthritis who presented initially 03/19/2021 after being told by PCP to present to ED after abnormal lab work.  Patient reported fatigue in the last several days and presented to PCP for work-up. ? ?On presentation to the ED was found to have significant AKI with lab work; sodium 130, potassium 5.3, creatinine 4.58, GFR 11, AST 107, ALT 46, hemoglobin 12.0 ? ?Pertinent  Medical History  ?Colon cancer ?Hyperlipidemia ?Hypertension ?Melanoma ?Arthritis ? ?Significant Hospital Events: ?Including procedures, antibiotic start and stop dates in addition to other pertinent events   ?3/8 presented after PCP recommended ED evaluation due to abnormal labs seen with severe AKI on presentation ?3/12 patient seen severely hypotensive in the setting of new severe GI bleed  ?3/15 patient is seen with signs of sepsis ?3/16 Intubated emergently due to acute respiratory distress  ?3/17 alert and mentating on vent, tolerating SBT well currently  ?3/18-extubated 3/17, as required BiPAP on and off ? ?Interim History / Subjective:  ?Alert and oriented, has no complaints ? ?Objective   ?Blood pressure (!) 107/51, pulse 85, temperature 97.9 ?F (36.6 ?C), temperature source Axillary, resp. rate (!) 22, height '5\' 6"'$  (1.676 m), weight 68 kg, SpO2 98 %. ?   ?FiO2 (%):  [40 %] 40 %  ? ?Intake/Output Summary (Last 24 hours) at 03/29/2021 0908 ?Last data filed at 03/29/2021 0700 ?Gross per 24 hour  ?Intake 2096.47 ml  ?Output 1430 ml  ?Net 666.47 ml  ? ?Filed Weights  ? 03/19/21 1648 03/28/21 1152  ?Weight: 70.3 kg 68 kg  ? ? ?Examination: ?General: Elderly gentleman, does not appear to be in  distress ?HEENT: Moist oral mucosa ?Neuro: Following commands ?CV: S1-S2 appreciated with no murmur ?PULM: Clear breath sounds ?GI: Sounds appreciated extremities: warm/dry, no edema  ?Skin: no rashes or lesions ? ?Resolved Hospital Problem list   ? ? ?Assessment & Plan:  ?Acute respiratory failure ?-S/p extubation 03/1818 23 ?-Oxygen supplementation as needed ?-As needed use of BiPAP for increased work of breathing ?-Chest x-ray from 317 reviewed by myself stable ? ?Acute kidney injury superimposed on chronic kidney disease stage IV ?Ureteral stricture with chronic bladder outlet obstruction and hydronephrosis ?-Creatinine elevated but stable ?-Maintain Foley in place ?-Foley to be addressed by urology as outpatient ?-Trend electrolytes ?-Avoid nephrotoxic's ? ?Urosepsis with acute concern for progression of infection ?-Started on Zosyn 3/17, was on ceftriaxone for 4 days, discontinued 3/17 ?-Tmax of 101.6 ?-Continue to monitor on ?-Proteus mirabilis in urine culture 03/25/2021 ?-Leukocytosis trending down ? ?Hemorrhagic shock in the setting of lower GI bleed ?Acute blood loss anemia ?-EGD did reveal gastric and duodenal ulcers ?-Continue PPI ?-Trend CBC ?-Transfuse as needed per protocol ? ?Shock liver/elevated liver enzymes ?-Hepatitis panel negative ?-Continue to trend, improving ? ?Hyponatremia ?-Stable ? ?Anemia with hematocrit of 6.8 ?-Transfuse 1 unit packed red cells ?-Patient agreeable to transfusion ? ?Tachycardia ?History of hypertension ?History of hyperlipidemia ?-Did receive beta-blockers yesterday ?-Will continue to monitor closely ? ?Update dr Sherral Hammers ?To TRH in AM 3/19 ? ?Best Practice (right click and "Reselect all SmartList Selections" daily)  ? ?Diet/type: NPO ?DVT prophylaxis: SCD ?GI prophylaxis: PPI ?Lines: N/A ?Foley:  N/A ?Code Status:  full  code ?Last date of multidisciplinary goals of care discussion: See sperate Plan of care note ?Discussed with daughters at bedside 03/28/2021 ? ?The patient  is critically ill with multiple organ systems failure and requires high complexity decision making for assessment and support, frequent evaluation and titration of therapies, application of advanced monitoring technologies and extensive interpretation of multiple databases. Critical Care Time devoted to patient care services described in this note independent of APP/resident time (if applicable)  is 32 minutes.  ? ?Sherrilyn Rist MD ?[facility] ?Personal pager: See Shea Evans ?If unanswered, please page ?CCM On-call: [phone] ? ? ? ? ?

## 2021-03-29 NOTE — Progress Notes (Signed)
[facility] Progress Note ? ?NAPHTALI ZYWICKI 88 y.o. February 25, 1924 ? ?CC: GI bleed ? ? ?Subjective: ?Patient seen and examined at bedside.  Family at bedside.  He is extubated.  Discussed with RN at bedside.  No bowel movement in last 2 days.  According to RN, patient is not getting MiraLAX because he is n.p.o. pending speech evaluation.  No bleeding episodes in the Last 2 to 3 days ? ?ROS : Not able to obtain ? ? ?Objective: ?Vital signs in last 24 hours: ?Vitals:  ? 03/29/21 1015 03/29/21 1042  ?BP: (!) 96/41 (!) 117/50  ?Pulse: 78 85  ?Resp: (!) 23 (!) 22  ?Temp: 97.7 ?F (36.5 ?C) 98 ?F (36.7 ?C)  ?SpO2: 98%   ? ? ?Physical Exam: ? ?General -elderly patient, alert, not in acute distress ?Abdomen -soft, nontender, nondistended, bowel sounds present.  No peritoneal sign ? ?Lab Results: ?Recent Labs  ?  03/28/21 ?2800 03/29/21 ?3491  ?NA 133* 134*  ?K 4.6 4.2  ?CL 106 106  ?CO2 17* 16*  ?GLUCOSE 106* 144*  ?BUN 42* 42*  ?CREATININE 2.78* 2.71*  ?CALCIUM 7.6* 7.6*  ?MG 2.3 2.3  ?PHOS 4.4 4.3  ? ?Recent Labs  ?  03/28/21 ?0343 03/29/21 ?7915  ?AST 125* 143*  ?ALT 202* 176*  ?ALKPHOS 145* 141*  ?BILITOT 0.3 0.5  ?PROT 5.8* 5.0*  ?ALBUMIN 2.0* 1.7*  ? ?Recent Labs  ?  03/28/21 ?0343 03/29/21 ?0569  ?WBC 36.8* 26.7*  ?NEUTROABS 33.2* 25.2*  ?HGB 8.1* 6.9*  ?HCT 24.7* 21.4*  ?MCV 92.2 91.5  ?PLT 317 297  ? ?No results for input(s): LABPROT, INR in the last 72 hours. ? ? ? ? ?Assessment/Plan: ?-GI bleed with melena.  Hemoglobin was down to 4.9.  EGD on March 24, 2021 showed gastric and duodenal ulcers.  Clean-based.  No active bleeding.  Gastric biopsies negative for H. pylori. ?-Acute blood loss anemia.  S/p blood transfusion ?-Respiratory failure.  Intubated now. ?-Urosepsis ?-Remote history of colon cancer.  Last colonoscopy scope in 2011 was normal. ?-Constipation ?-Abnormal LFTs.  Chronic.  Normal INR.  Normal platelets.  Ultrasound showed gallbladder sludge but no acute changes.  Elevated LFTs could be from  underlying sepsis. ?  ?Recommendation ?------------------------- ?-LFT continues to improve ?-Drop in hemoglobin noted.  Could be dilutional.  No bleeding episodes in last 2 to 3 days. ?-Patient currently n.p.o. pending speech evaluation.  Give 1 Fleet enema for constipation. ?-GI will follow periodically ? ? ?Otis Brace MD, FACP ?03/29/2021, 10:52 AM ? ?Contact #  [phone]  ?

## 2021-03-29 NOTE — Progress Notes (Signed)
Patient currently on room air and tolerating well with no distress. Saturations 96% RA. The BIPAP is only on standby at this time. RT will continue to monitor ?

## 2021-03-30 DIAGNOSIS — D62 Acute posthemorrhagic anemia: Secondary | ICD-10-CM | POA: Diagnosis not present

## 2021-03-30 DIAGNOSIS — J9601 Acute respiratory failure with hypoxia: Secondary | ICD-10-CM | POA: Diagnosis not present

## 2021-03-30 DIAGNOSIS — N179 Acute kidney failure, unspecified: Secondary | ICD-10-CM | POA: Diagnosis not present

## 2021-03-30 DIAGNOSIS — D72828 Other elevated white blood cell count: Secondary | ICD-10-CM

## 2021-03-30 DIAGNOSIS — R7989 Other specified abnormal findings of blood chemistry: Secondary | ICD-10-CM | POA: Diagnosis not present

## 2021-03-30 LAB — CBC WITH DIFFERENTIAL/PLATELET
Abs Immature Granulocytes: 0 10*3/uL (ref 0.00–0.07)
Band Neutrophils: 4 %
Basophils Absolute: 0 10*3/uL (ref 0.0–0.1)
Basophils Relative: 0 %
Eosinophils Absolute: 0 10*3/uL (ref 0.0–0.5)
Eosinophils Relative: 0 %
HCT: 25.2 % — ABNORMAL LOW (ref 39.0–52.0)
Hemoglobin: 8.1 g/dL — ABNORMAL LOW (ref 13.0–17.0)
Lymphocytes Relative: 1 %
Lymphs Abs: 0.3 10*3/uL — ABNORMAL LOW (ref 0.7–4.0)
MCH: 28.2 pg (ref 26.0–34.0)
MCHC: 32.1 g/dL (ref 30.0–36.0)
MCV: 87.8 fL (ref 80.0–100.0)
Monocytes Absolute: 0 10*3/uL — ABNORMAL LOW (ref 0.1–1.0)
Monocytes Relative: 0 %
Neutro Abs: 33.9 10*3/uL — ABNORMAL HIGH (ref 1.7–7.7)
Neutrophils Relative %: 95 %
Platelets: 317 10*3/uL (ref 150–400)
RBC: 2.87 MIL/uL — ABNORMAL LOW (ref 4.22–5.81)
RDW: 19.9 % — ABNORMAL HIGH (ref 11.5–15.5)
WBC: 34.2 10*3/uL — ABNORMAL HIGH (ref 4.0–10.5)
nRBC: 0 % (ref 0.0–0.2)

## 2021-03-30 LAB — COMPREHENSIVE METABOLIC PANEL
ALT: 204 U/L — ABNORMAL HIGH (ref 0–44)
AST: 211 U/L — ABNORMAL HIGH (ref 15–41)
Albumin: 1.8 g/dL — ABNORMAL LOW (ref 3.5–5.0)
Alkaline Phosphatase: 138 U/L — ABNORMAL HIGH (ref 38–126)
Anion gap: 9 (ref 5–15)
BUN: 48 mg/dL — ABNORMAL HIGH (ref 8–23)
CO2: 18 mmol/L — ABNORMAL LOW (ref 22–32)
Calcium: 7.7 mg/dL — ABNORMAL LOW (ref 8.9–10.3)
Chloride: 108 mmol/L (ref 98–111)
Creatinine, Ser: 2.69 mg/dL — ABNORMAL HIGH (ref 0.61–1.24)
GFR, Estimated: 21 mL/min — ABNORMAL LOW (ref >60)
Glucose, Bld: 122 mg/dL — ABNORMAL HIGH (ref 70–99)
Potassium: 4.4 mmol/L (ref 3.5–5.1)
Sodium: 135 mmol/L (ref 135–145)
Total Bilirubin: 0.1 mg/dL — ABNORMAL LOW (ref 0.3–1.2)
Total Protein: 5.4 g/dL — ABNORMAL LOW (ref 6.5–8.1)

## 2021-03-30 LAB — GLUCOSE, CAPILLARY
Glucose-Capillary: 109 mg/dL — ABNORMAL HIGH (ref 70–99)
Glucose-Capillary: 110 mg/dL — ABNORMAL HIGH (ref 70–99)
Glucose-Capillary: 127 mg/dL — ABNORMAL HIGH (ref 70–99)
Glucose-Capillary: 130 mg/dL — ABNORMAL HIGH (ref 70–99)
Glucose-Capillary: 83 mg/dL (ref 70–99)
Glucose-Capillary: 86 mg/dL (ref 70–99)

## 2021-03-30 LAB — CULTURE, BLOOD (ROUTINE X 2)
Culture: NO GROWTH
Culture: NO GROWTH
Special Requests: ADEQUATE
Special Requests: ADEQUATE

## 2021-03-30 LAB — MAGNESIUM: Magnesium: 2.2 mg/dL (ref 1.7–2.4)

## 2021-03-30 LAB — STREP PNEUMONIAE URINARY ANTIGEN: Strep Pneumo Urinary Antigen: NEGATIVE

## 2021-03-30 LAB — PHOSPHORUS: Phosphorus: 4.3 mg/dL (ref 2.5–4.6)

## 2021-03-30 NOTE — Progress Notes (Signed)
[facility] Progress Note ? ?Jesus Kim 88 y.o. [birth year] ? ?CC: GI bleed ? ? ?Subjective: ?Patient seen and examined at bedside.  Family at bedside.  Had a bowel movement yesterday with fleets enema.  Discussed with RN.  No overt bleeding noted. ? ?ROS : Not able to obtain ? ? ?Objective: ?Vital signs in last 24 hours: ?Vitals:  ? 03/30/21 0841 03/30/21 0900  ?BP:  133/65  ?Pulse: 82 84  ?Resp: 19 20  ?Temp: 98 ?F (36.7 ?C)   ?SpO2: 94% 97%  ? ? ?Physical Exam: ? ?General -elderly patient, alert, not in acute distress ?Abdomen -soft, nontender, nondistended, bowel sounds present.  No peritoneal sign ? ?Lab Results: ?Recent Labs  ?  03/29/21 ?9476 03/30/21 ?5465  ?NA 134* 135  ?K 4.2 4.4  ?CL 106 108  ?CO2 16* 18*  ?GLUCOSE 144* 122*  ?BUN 42* 48*  ?CREATININE 2.71* 2.69*  ?CALCIUM 7.6* 7.7*  ?MG 2.3 2.2  ?PHOS 4.3 4.3  ? ?Recent Labs  ?  03/29/21 ?0354 03/30/21 ?6568  ?AST 143* 211*  ?ALT 176* 204*  ?ALKPHOS 141* 138*  ?BILITOT 0.5 0.1*  ?PROT 5.0* 5.4*  ?ALBUMIN 1.7* 1.8*  ? ?Recent Labs  ?  03/29/21 ?1275 03/29/21 ?1539 03/30/21 ?0339  ?WBC 26.7*  --  34.2*  ?NEUTROABS 25.2*  --  33.9*  ?HGB 6.9* 8.2* 8.1*  ?HCT 21.4* 25.8* 25.2*  ?MCV 91.5  --  87.8  ?PLT 297  --  317  ? ?No results for input(s): LABPROT, INR in the last 72 hours. ? ? ? ? ?Assessment/Plan: ?-GI bleed with melena.  Hemoglobin was down to 4.9.  EGD on March 24, 2021 showed gastric and duodenal ulcers.  Clean-based.  No active bleeding.  Gastric biopsies negative for H. pylori. ?-Acute blood loss anemia.  S/p blood transfusion ?-Respiratory failure.  Intubated now. ?-Urosepsis ?-Remote history of colon cancer.  Last colonoscopy scope in 2011 was normal. ?-Constipation ?-Abnormal LFTs.  Chronic.  Normal INR.  Normal platelets.  Ultrasound showed gallbladder sludge but no acute changes.  Elevated LFTs could be from underlying sepsis. ?  ?Recommendation ?------------------------- ?-LFT fluctuating but relatively stable. ?-Continue MiraLAX  twice daily ?-Advance diet per speech recommendations ?-GI will follow periodically ? ? ?Otis Brace MD, FACP ?03/30/2021, 11:24 AM ? ?Contact #  [phone]  ?

## 2021-03-30 NOTE — Progress Notes (Signed)
?PROGRESS NOTE ? ? ? ?Jesus Kim  ZOX:[id number] DOB: May 12, 1924 DOA: 03/19/2021 ?PCP: [name], MD  ? ?Brief Narrative:  ?88 y.o. WM PMHx  HTN, OA, HLD, CKD 4  ? ?Presents after being called by his PCP to come to the emergency room due to abnormal lab work.  He reports he has had fatigue for the last few days and was going for his routine lab work with his PCP today.  States he has had decreased p.o. intake for the last 2 days due to his not feeling well.  He has not had any nausea, vomiting, diarrhea, fever.  He has no specific complaints and denies any chest pain, cough, shortness of breath, palpitations, confusion, urinary symptoms, muscle aches.  Lives at home with his wife.  No recent travel. ?No known sick contacts other than his wife has some mild nasal congestion. ?Was found to have AKI on repeat lab work with creatinine of 4.58 which is increased from his baseline of 2.4-2.5.  Hospitalist service was asked to admit for further management ? ? ?Subjective: ?3/19 afebrile overnight, A/O x4.  Sitting in chair comfortably eating his breakfast.  States that he feels very great today. ? ?  ? ?Assessment & Plan: ? Covid vaccination; vaccinated ? ?Principal Problem: ?  Acute kidney injury superimposed on CKD ([place]) ?Active Problems: ?  Hyperkalemia ?  Essential hypertension ?  Chronic hyponatremia ?  Acute blood loss anemia ?  Hemorrhagic shock ([place]) ?  Acute hypoxemic respiratory failure ([place]) ?  Shock liver ?  Elevated liver enzymes ? ?Acute kidney injury superimposed on CKD (H[facility]) (baseline of 2.4-2.5) ?-Pt has known hx of urethral stricture with chronic bladder outlet obstruction and chronic B hydronephrosis ?-Urology consulted.  s/p foley placement, to remain in until f/u with Urology as outpt ?Lab Results  ?Component Value Date  ? CREATININE 2.69 (H) 03/30/2021  ? CREATININE 2.71 (H) 03/29/2021  ? CREATININE 2.78 (H) 03/28/2021  ? CREATININE 3.35 (H) 03/27/2021  ? CREATININE 2.59 (H) 03/26/2021  ?-3/18  sodium bicarb 73m/hr ? ?Severe Urosepsis/positive Proteus mirabilis ?-3/15 patient meets criteria for sepsis WBC> 12K, RR> 20, HR> 90, ?-Given patient's urethral stricture and chronic outlet obstruction treat as complicated UTI.  Complete 14 days of antibiotics ?-3/16 lactic acid= 2.1, continue to trend ?- 3/16 procalcitonin 1.13 continue to trend ? ?Leukocytosis ?- 3/19 unsure of cause, patient on appropriate antibiotic for urosepsis, negative fever, clinically patient significantly improved.  Will monitor 1 more day and if continues to climb or if patient develops any clinical symptoms will consult ID ?Lab Results  ?Component Value Date  ? WBC 34.2 (H) 03/30/2021  ? WBC 26.7 (H) 03/29/2021  ? WBC 36.8 (H) 03/28/2021  ? WBC 37.8 (H) 03/27/2021  ? WBC 32.0 (H) 03/26/2021  ? ? ?Essential HTN/Hypotension ?-Hold all BP medication.  Secondary to hemorrhagic shock. ?- 3/13 off pressors  ? ?Sinus tachycardia vs A-fib with RVR ?- 3/16 EKG pending ? ?Ac[place]emic respiratory failure ([place] ?-Baseline on room air ?-CXR reviewed, clear, VQ neg ?-3/12, required 6LNC likely secondary to marked anemia ?-3/16 treated with racemic epi without improvement and patient's respiratory status ?-3/16 stridor, tachypneic spoke with Dr. SHalford ChessmanPCCM we decided to intubate patient in order to allow him to rest. ?-3/19 strep pneumo, Legionella, sputum pending ? ? ?Hemorrhagic shock ([place] ?-3/12, SBP into the 70's in setting of large GI bleed  ?-PCCM consulted. Required brief pressor support, now off pressors as of 3/13 ?  ?Acute  blood loss anemia ?-On 3/12, noted to have large dark bloody BM with subsequent hypotension,Hgb down to 4.9. ?-3/12 transfuse 3 units PRBC  ?-3/13 s/p EGD findings of multiple ulcers without active bleed ?-Protonix 40 mg  BID ?Lab Results  ?Component Value Date  ? HGB 8.1 (L) 03/30/2021  ? HGB 8.2 (L) 03/29/2021  ? HGB 6.9 (LL) 03/29/2021  ? HGB 8.1 (L) 03/28/2021  ? HGB 8.3 (L) 03/27/2021  ?-3/18 transfuse 1 unit  PRBC ? ?Shock liver/elevated liver enzymes ?- 3/15 elevated liver enzymes most likely secondary to patient's hemorrhagic shock however will also check acute hepatitis panel ?- 3/16 overnight patient's hepatic status has decompensated.  Unsure of cause, there was no hepatic toxic medication, negative hypotension.   ? ?Hyperkalemia ?-Likely secondary to renal failure ?-3/15, resolved  ? ?Hypomagnesmia ?- Magnesium goal> 2 ?- 3/16 magnesium IV 3 g ? ?Chronic hyponatremia (baseline~130 to 133) ?-Improved with IVF ?-Asymptomatic  ? ? ?DVT prophylaxis: SCD ?Code Status: Full ?Family Communication: 3/19 daughter and son-in-law at bedside for discussion of plan of care all questions answered ?Status is: Inpatient ? ? ? ?Dispo: The patient is from: Home ?             Anticipated d/c is to: Home ?             Anticipated d/c date is: 3 days ?             Patient currently is not medically stable to d/c. ? ? ? ? ? ?Consultants:  ?GI ?PCCM ? ? ?Procedures/Significant Events:  ?Renal US with findings suggesting chronic B hydronephrosis  ?3/16 intubated ? ? ?I have personally reviewed and interpreted all radiology studies and my findings are as above. ? ?VENTILATOR SETTINGS: ?Room air 3/19 ?SPO2 94% ? ? ?Cultures ?3/14 urine positive Proteus mirabilis ?3/14 blood RIGHT AC NGTD ?3/14 blood LEFT hand NGTD ?3/16 blood RIGHT hand NGTD  ?3/16 blood LEFT hand NGTD ?3/19 strep pneumo pending ?3/19 Legionella pending ?3/19 sputum pending ? ? ? ? ?Antimicrobials: ?Anti-infectives (From admission, onward)  ? ? Start     Ordered Stop  ? 03/28/21 2000  piperacillin-tazobactam (ZOSYN) IVPB 2.25 g       ? 03/28/21 1503    ? 03/28/21 1200  piperacillin-tazobactam (ZOSYN) IVPB 3.375 g  Status:  Discontinued       ? 03/28/21 1031 03/28/21 1503  ? 03/26/21 1000  cefTRIAXone (ROCEPHIN) 2 g in sodium chloride 0.9 % 100 mL IVPB  Status:  Discontinued       ? 03/25/21 1324 03/28/21 1031  ? 03/25/21 1400  cefTRIAXone (ROCEPHIN) 1 g in sodium chloride  0.9 % 100 mL IVPB       ? 03/25/21 1324 03/25/21 1409  ? 03/25/21 0830  cefTRIAXone (ROCEPHIN) 1 g in sodium chloride 0.9 % 100 mL IVPB  Status:  Discontinued       ? 03/25/21 0734 03/25/21 1329  ? ?  ?  ? ?Devices ?  ? ?LINES / TUBES:  ? ? ? ? ?Continuous Infusions: ? sodium chloride Stopped (03/28/21 1127)  ? lactated ringers 75 mL/hr at 03/30/21 0417  ? norepinephrine (LEVOPHED) Adult infusion Stopped (03/29/21 0522)  ? piperacillin-tazobactam (ZOSYN)  IV Stopped (03/30/21 0428)  ? ? ? ?Objective: ?Vitals:  ? 03/30/21 0400 03/30/21 0500 03/30/21 0600 03/30/21 0841  ?BP: (!) 120/58 (!) 99/55 (!) 115/48   ?Pulse: 83 79 73 82  ?Resp: (!) '23 15 15 19  '$ ?Temp: 98.1 ?F (36.7 ?  C)     ?TempSrc: Oral     ?SpO2: 95% 97% 96% 94%  ?Weight:      ?Height:      ? ? ?Intake/Output Summary (Last 24 hours) at 03/30/2021 0854 ?Last data filed at 03/30/2021 0417 ?Gross per 24 hour  ?Intake 2680.82 ml  ?Output 650 ml  ?Net 2030.82 ml  ? ? ?Filed Weights  ? 03/19/21 1648 03/28/21 1152  ?Weight: 70.3 kg 68 kg  ? ?Examination: ? ?General: A/O x4 negative acute respiratory distress, rigors ?Eyes: negative scleral hemorrhage, negative anisocoria, negative icterus ?ENT: Negative Runny nose, negative gingival bleeding, ?Neck:  Negative scars, masses, torticollis, lymphadenopathy, JVD ?Lungs: clear to auscultation bilaterally, without wheezes or crackles ?Cardiovascular: Regular rhythm and rate without murmur gallop or rub normal S1 and S2 ?Abdomen: negative abdominal pain, nondistended, positive soft, bowel sounds, no rebound, no ascites, no appreciable mass ?Extremities: No significant cyanosis, clubbing, or edema bilateral lower extremities ?Skin: Negative rashes, lesions, ulcers ?Psychiatric:  Negative depression, negative anxiety, negative fatigue, negative mania  ?Central nervous system:  Cranial nerves II through XII intact, tongue/uvula midline, all extremities muscle strength 5/5, sensation intact throughout, negative dysarthria,  negative expressive aphasia, negative receptive aphasia. ? ?.  ? ? ? ?Data Reviewed: Care during the described time interval was provided by me .  I have reviewed this patient's available data, including medical

## 2021-03-31 DIAGNOSIS — D62 Acute posthemorrhagic anemia: Secondary | ICD-10-CM | POA: Diagnosis not present

## 2021-03-31 DIAGNOSIS — N179 Acute kidney failure, unspecified: Secondary | ICD-10-CM | POA: Diagnosis not present

## 2021-03-31 DIAGNOSIS — J9601 Acute respiratory failure with hypoxia: Secondary | ICD-10-CM | POA: Diagnosis not present

## 2021-03-31 DIAGNOSIS — R7989 Other specified abnormal findings of blood chemistry: Secondary | ICD-10-CM | POA: Diagnosis not present

## 2021-03-31 DIAGNOSIS — K6289 Other specified diseases of anus and rectum: Secondary | ICD-10-CM | POA: Diagnosis present

## 2021-03-31 DIAGNOSIS — K649 Unspecified hemorrhoids: Secondary | ICD-10-CM | POA: Diagnosis present

## 2021-03-31 LAB — COMPREHENSIVE METABOLIC PANEL
ALT: 214 U/L — ABNORMAL HIGH (ref 0–44)
AST: 183 U/L — ABNORMAL HIGH (ref 15–41)
Albumin: 1.8 g/dL — ABNORMAL LOW (ref 3.5–5.0)
Alkaline Phosphatase: 124 U/L (ref 38–126)
Anion gap: 7 (ref 5–15)
BUN: 43 mg/dL — ABNORMAL HIGH (ref 8–23)
CO2: 19 mmol/L — ABNORMAL LOW (ref 22–32)
Calcium: 7.5 mg/dL — ABNORMAL LOW (ref 8.9–10.3)
Chloride: 109 mmol/L (ref 98–111)
Creatinine, Ser: 2.61 mg/dL — ABNORMAL HIGH (ref 0.61–1.24)
GFR, Estimated: 22 mL/min — ABNORMAL LOW (ref >60)
Glucose, Bld: 85 mg/dL (ref 70–99)
Potassium: 4 mmol/L (ref 3.5–5.1)
Sodium: 135 mmol/L (ref 135–145)
Total Bilirubin: 0.2 mg/dL — ABNORMAL LOW (ref 0.3–1.2)
Total Protein: 5.3 g/dL — ABNORMAL LOW (ref 6.5–8.1)

## 2021-03-31 LAB — CBC WITH DIFFERENTIAL/PLATELET
Abs Immature Granulocytes: 0.22 10*3/uL — ABNORMAL HIGH (ref 0.00–0.07)
Basophils Absolute: 0 10*3/uL (ref 0.0–0.1)
Basophils Relative: 0 %
Eosinophils Absolute: 0 10*3/uL (ref 0.0–0.5)
Eosinophils Relative: 0 %
HCT: 25.7 % — ABNORMAL LOW (ref 39.0–52.0)
Hemoglobin: 8.1 g/dL — ABNORMAL LOW (ref 13.0–17.0)
Immature Granulocytes: 1 %
Lymphocytes Relative: 9 %
Lymphs Abs: 1.4 10*3/uL (ref 0.7–4.0)
MCH: 28 pg (ref 26.0–34.0)
MCHC: 31.5 g/dL (ref 30.0–36.0)
MCV: 88.9 fL (ref 80.0–100.0)
Monocytes Absolute: 1.1 10*3/uL — ABNORMAL HIGH (ref 0.1–1.0)
Monocytes Relative: 7 %
Neutro Abs: 13.7 10*3/uL — ABNORMAL HIGH (ref 1.7–7.7)
Neutrophils Relative %: 83 %
Platelets: 315 10*3/uL (ref 150–400)
RBC: 2.89 MIL/uL — ABNORMAL LOW (ref 4.22–5.81)
RDW: 19.8 % — ABNORMAL HIGH (ref 11.5–15.5)
WBC: 16.4 10*3/uL — ABNORMAL HIGH (ref 4.0–10.5)
nRBC: 0 % (ref 0.0–0.2)

## 2021-03-31 LAB — GLUCOSE, CAPILLARY
Glucose-Capillary: 82 mg/dL (ref 70–99)
Glucose-Capillary: 102 mg/dL — ABNORMAL HIGH (ref 70–99)
Glucose-Capillary: 99 mg/dL (ref 70–99)
Glucose-Capillary: 88 mg/dL (ref 70–99)
Glucose-Capillary: 157 mg/dL — ABNORMAL HIGH (ref 70–99)

## 2021-03-31 LAB — PHOSPHORUS: Phosphorus: 3.1 mg/dL (ref 2.5–4.6)

## 2021-03-31 LAB — MAGNESIUM: Magnesium: 2.1 mg/dL (ref 1.7–2.4)

## 2021-03-31 MED ORDER — LIDOCAINE 4 % EX CREA: TOPICAL_CREAM | Freq: Two times a day (BID) | CUTANEOUS | Status: DC

## 2021-03-31 MED ORDER — HYDROCORTISONE ACETATE 25 MG RE SUPP
25.0000 mg | Freq: Two times a day (BID) | RECTAL | Status: DC
  Administered 2021-03-31 – 2021-04-06 (×8): 25 mg via RECTAL
  Filled 2021-03-31 (×13): qty 1

## 2021-03-31 MED ORDER — LIDOCAINE 4 % EX CREA
TOPICAL_CREAM | Freq: Two times a day (BID) | CUTANEOUS | Status: DC
  Administered 2021-04-06: 1 via TOPICAL
  Filled 2021-03-31 (×2): qty 5

## 2021-03-31 NOTE — Progress Notes (Addendum)
[facility] Progress Note ? ?Jesus Kim 88 y.o. Mar 10, 1924 ? ?CC:  GI bleed ? ? ?Subjective: ?Patient states he has been sleeping well and tolerating diet without difficultly. No acute GI symptoms today. No overt bleeding noted. Has not had a BM since 3/18 per flowsheet review. ? ?ROS : Review of Systems  ?Gastrointestinal:  Negative for abdominal pain, blood in stool, constipation, diarrhea, heartburn, melena, nausea and vomiting.  ?Genitourinary:  Negative for dysuria and urgency.   ? ? ?Objective: ?Vital signs in last 24 hours: ?Vitals:  ? 03/30/21 2339 03/31/21 0344  ?BP: (!) 112/57 127/69  ?Pulse: 79 87  ?Resp: 18 20  ?Temp: 98.6 ?F (37 ?C) 97.9 ?F (36.6 ?C)  ?SpO2: 98% 99%  ? ? ?Physical Exam: ? ?General:  Alert, cooperative, no distress, elderly  ?Head:  Normocephalic, without obvious abnormality, atraumatic  ?Eyes:  Anicteric sclera, EOM's intact  ?Lungs:   Clear to auscultation bilaterally, respirations unlabored  ?Heart:  Regular rate and rhythm, S1, S2 normal  ?Abdomen:   Soft, non-tender, bowel sounds active all four quadrants,  no masses,   ? ? ?Lab Results: ?Recent Labs  ?  03/30/21 ?0339 03/31/21 ?2909  ?NA 135 135  ?K 4.4 4.0  ?CL 108 109  ?CO2 18* 19*  ?GLUCOSE 122* 85  ?BUN 48* 43*  ?CREATININE 2.69* 2.61*  ?CALCIUM 7.7* 7.5*  ?MG 2.2 2.1  ?PHOS 4.3 3.1  ? ?Recent Labs  ?  03/30/21 ?0339 03/31/21 ?0301  ?AST 211* 183*  ?ALT 204* 214*  ?ALKPHOS 138* 124  ?BILITOT 0.1* 0.2*  ?PROT 5.4* 5.3*  ?ALBUMIN 1.8* 1.8*  ? ?Recent Labs  ?  03/30/21 ?0339 03/31/21 ?4996  ?WBC 34.2* 16.4*  ?NEUTROABS 33.9* 13.7*  ?HGB 8.1* 8.1*  ?HCT 25.2* 25.7*  ?MCV 87.8 88.9  ?PLT 317 315  ? ?No results for input(s): LABPROT, INR in the last 72 hours. ? ? ? ?Assessment ?GI bleed with Melena, acute blood loss anemia ?- EGD 3/13: showed gastric and duodenal ulcers, clean-based, no active bleeding. Gastric biopsies negative for H. Pylori. ?- hgb 8.1 (was 4.9 on arrival) ? ?Elevated LFTs ?- Chronic, normal INR, normal  platelets ?- [place]: showed gallbladder sludge but no acute changes. ?- AST 183/ ALT 214/ Alk phos 124 (improving) ?- T. Bili 0.2 ? ?Remote history of colon cancer ?- last colonoscopy 2011: normal ? ?Urosepsis ?- 3/16 patient was emergently intubated dur to acute respiratory distress ?- 3/18 extubated. ?- leukocytosis improving (16.4) ? ?AKI superimposed on CKD ?- known urethral stricture with chronic bladder outlet obstruction and chronic B hydronephrosis ?- BUN 43, Cr. 2.61 ?- GFR 22 ? ? ?Plan: ?LFTs fluctuating, though stable. Improved from yesterday. Possibly from underlying sepsis ?Continue miralax twice daily. ?Continue diet per speech recommendations ?[facility] will sign off. Please contact us if we can be of any further assistance during this hospital stay.  ? ?Garnette Scheuermann PA-C ?03/31/2021, 8:55 AM ? ?Contact #  [phone]  ?

## 2021-03-31 NOTE — Progress Notes (Signed)
?  Progress Note  ? ?Date: 03/28/2021 ? ?Patient Name: Jesus Kim        ?MRN#: [id number] ? ? ? ?Unable to clinically determine whether proteus UTI was present on admission. ? ? ? ? ?

## 2021-03-31 NOTE — Care Management Important Message (Signed)
Important Message ? ?Patient Details IM Letter given to the Patient. ?Name: Jesus Kim ?MRN: [id number] ?Date of Birth: 1924/04/21 ? ? ?Medicare Important Message Given:  Yes ? ? ? ? ?Kerin Salen ?03/31/2021, 1:03 PM ?

## 2021-03-31 NOTE — Progress Notes (Signed)
?PROGRESS NOTE ? ? ? ?Jesus Kim  WCH:[id number] DOB: Feb 22, 1924 DOA: 03/19/2021 ?PCP: [name], MD  ? ?Brief Narrative:  ?88 y.o. WM PMHx  HTN, OA, HLD, CKD 4  ? ?Presents after being called by his PCP to come to the emergency room due to abnormal lab work.  He reports he has had fatigue for the last few days and was going for his routine lab work with his PCP today.  States he has had decreased p.o. intake for the last 2 days due to his not feeling well.  He has not had any nausea, vomiting, diarrhea, fever.  He has no specific complaints and denies any chest pain, cough, shortness of breath, palpitations, confusion, urinary symptoms, muscle aches.  Lives at home with his wife.  No recent travel. ?No known sick contacts other than his wife has some mild nasal congestion. ?Was found to have AKI on repeat lab work with creatinine of 4.58 which is increased from his baseline of 2.4-2.5.  Hospitalist service was asked to admit for further management ? ? ?Subjective: ?3/20 A/O x4.  Resting in bed comfortably.  Only complaint is rectal pain. ? ? ?Assessment & Plan: ? Covid vaccination; vaccinated ? ?Principal Problem: ?  Acute kidney injury superimposed on CKD ([place]) ?Active Problems: ?  Hyperkalemia ?  Essential hypertension ?  Chronic hyponatremia ?  Acute blood loss anemia ?  Hemorrhagic shock ([place]) ?  Acute hypoxemic respiratory failure ([place]) ?  Shock liver ?  Elevated liver enzymes ? ?Acute kidney injury superimposed on CKD ([facility]) (baseline of 2.4-2.5) ?-Pt has known hx of urethral stricture with chronic bladder outlet obstruction and chronic B hydronephrosis ?-Urology consulted.  s/p foley placement, to remain in until f/u with Urology as outpt ?Lab Results  ?Component Value Date  ? CREATININE 2.61 (H) 03/31/2021  ? CREATININE 2.69 (H) 03/30/2021  ? CREATININE 2.71 (H) 03/29/2021  ? CREATININE 2.78 (H) 03/28/2021  ? CREATININE 3.35 (H) 03/27/2021  ?- 3/14 lactated Ringer's 14m/hr ? ?Severe Urosepsis/positive  Proteus mirabilis ?-3/15 patient meets criteria for sepsis WBC> 12K, RR> 20, HR> 90, ?-Given patient's urethral stricture and chronic outlet obstruction treat as complicated UTI.  Complete 14 days of antibiotics ?-3/16 lactic acid= 2.1, continue to trend ?- 3/16 procalcitonin 1.13 continue to trend ? ?Leukocytosis ?- 3/19 unsure of cause, patient on appropriate antibiotic for urosepsis, negative fever, clinically patient significantly improved.  Will monitor 1 more day and if continues to climb or if patient develops any clinical symptoms will consult ID ?Lab Results  ?Component Value Date  ? WBC 16.4 (H) 03/31/2021  ? WBC 34.2 (H) 03/30/2021  ? WBC 26.7 (H) 03/29/2021  ? WBC 36.8 (H) 03/28/2021  ? WBC 37.8 (H) 03/27/2021  ? ? ?Essential HTN/Hypotension ?-Hold all BP medication.  Secondary to hemorrhagic shock. ?- 3/13 off pressors  ? ?Sinus tachycardia vs A-fib with RVR ?- 3/16 EKG pending ? ?Acute hypoxemic respiratory failure ([place] ?-Baseline on room air ?-CXR reviewed, clear, VQ neg ?-3/12, required 6LNC likely secondary to marked anemia ?-3/16 treated with racemic epi without improvement and patient's respiratory status ?-3/16 stridor, tachypneic spoke with Dr. SHalford ChessmanPCCM we decided to intubate patient in order to allow him to rest. ?-3/19 strep pneumo, Legionella, sputum pending ? ? ?Hemorrhagic shock ([place] ?-3/12, SBP into the 70's in setting of large GI bleed  ?-PCCM consulted. Required brief pressor support, now off pressors as of 3/13 ?  ?Acute blood loss anemia ?-On 3/12, noted to have  large dark bloody BM with subsequent hypotension,Hgb down to 4.9. ?-3/12 transfuse 3 units PRBC  ?-3/13 s/p EGD findings of multiple ulcers without active bleed ?-Protonix 40 mg  BID ?Lab Results  ?Component Value Date  ? HGB 8.1 (L) 03/31/2021  ? HGB 8.1 (L) 03/30/2021  ? HGB 8.2 (L) 03/29/2021  ? HGB 6.9 (LL) 03/29/2021  ? HGB 8.1 (L) 03/28/2021  ?-3/18 transfuse 1 unit PRBC ? ?Shock liver/elevated liver enzymes ?- 3/15  elevated liver enzymes most likely secondary to patient's hemorrhagic shock however will also check acute hepatitis panel ?- 3/16 overnight patient's hepatic status has decompensated.  Unsure of cause, there was no hepatic toxic medication, negative hypotension.   ? ?Rectal pain/Hemorrhoids ?-Anusol suppositories BID ?-Lidocaine cream 0.4% BID ? ?Hyperkalemia ?-Likely secondary to renal failure ?-3/15, resolved  ? ?Hypomagnesmia ?- Magnesium goal> 2 ?- 3/16 magnesium IV 3 g ? ?Hypocalcemia ?- 3/20 corrected calcium= 9.3 WNL ? ?Chronic hyponatremia (baseline~130 to 133) ?-Improved with IVF ?-Asymptomatic  ? ?Goals of care ?- 3/20 PT/OT consult: Severe urosepsis, acute hypoxic respiratory failure, deconditioning evaluate for CIR vs SNF ? ? ?DVT prophylaxis: SCD ?Code Status: Full ?Family Communication: 3/20 son-in-law at bedside for discussion of plan of care all questions answered ?Status is: Inpatient ? ? ? ?Dispo: The patient is from: Home ?             Anticipated d/c is to: Home ?             Anticipated d/c date is: 3 days ?             Patient currently is not medically stable to d/c. ? ? ? ? ? ?Consultants:  ?GI ?PCCM ? ? ?Procedures/Significant Events:  ?Renal US with findings suggesting chronic B hydronephrosis  ?3/16 intubated ? ? ?I have personally reviewed and interpreted all radiology studies and my findings are as above. ? ?VENTILATOR SETTINGS: ?Room air 3/20 ?SPO2 98% ? ? ?Cultures ?3/14 urine positive Proteus mirabilis ?3/14 blood RIGHT AC NGTD ?3/14 blood LEFT hand NGTD ?3/16 blood RIGHT hand NGTD  ?3/16 blood LEFT hand NGTD ?3/19 strep pneumo negative ?3/19 Legionella pending ? ? ? ? ? ?Antimicrobials: ?Anti-infectives (From admission, onward)  ? ? Start     Ordered Stop  ? 03/28/21 2000  piperacillin-tazobactam (ZOSYN) IVPB 2.25 g       ? 03/28/21 1503    ? 03/28/21 1200  piperacillin-tazobactam (ZOSYN) IVPB 3.375 g  Status:  Discontinued       ? 03/28/21 1031 03/28/21 1503  ? 03/26/21 1000   cefTRIAXone (ROCEPHIN) 2 g in sodium chloride 0.9 % 100 mL IVPB  Status:  Discontinued       ? 03/25/21 1324 03/28/21 1031  ? 03/25/21 1400  cefTRIAXone (ROCEPHIN) 1 g in sodium chloride 0.9 % 100 mL IVPB       ? 03/25/21 1324 03/25/21 1409  ? 03/25/21 0830  cefTRIAXone (ROCEPHIN) 1 g in sodium chloride 0.9 % 100 mL IVPB  Status:  Discontinued       ? 03/25/21 0734 03/25/21 1329  ? ?  ?  ? ?Devices ?  ? ?LINES / TUBES:  ? ? ? ? ?Continuous Infusions: ? sodium chloride Stopped (03/28/21 1127)  ? lactated ringers 75 mL/hr at 03/31/21 0902  ? piperacillin-tazobactam (ZOSYN)  IV 2.25 g (03/31/21 0446)  ? ? ? ?Objective: ?Vitals:  ? 03/30/21 1618 03/30/21 1936 03/30/21 2339 03/31/21 0344  ?BP: 127/72 134/70 (!) 112/57 127/69  ?Pulse:  95 98 79 87  ?Resp:  '18 18 20  '$ ?Temp: 98.6 ?F (37 ?C) 97.9 ?F (36.6 ?C) 98.6 ?F (37 ?C) 97.9 ?F (36.6 ?C)  ?TempSrc: Oral Oral Oral Oral  ?SpO2: 97% 99% 98% 99%  ?Weight:      ?Height:      ? ? ?Intake/Output Summary (Last 24 hours) at 03/31/2021 1236 ?Last data filed at 03/31/2021 0600 ?Gross per 24 hour  ?Intake 1405.6 ml  ?Output 2150 ml  ?Net -744.4 ml  ? ? ?Filed Weights  ? 03/19/21 1648 03/28/21 1152  ?Weight: 70.3 kg 68 kg  ? ?Examination: ? ?General: A/O x4 negative acute respiratory distress, rigors ?Eyes: negative scleral hemorrhage, negative anisocoria, negative icterus ?ENT: Negative Runny nose, negative gingival bleeding, ?Neck:  Negative scars, masses, torticollis, lymphadenopathy, JVD ?Lungs: clear to auscultation bilaterally, without wheezes or crackles ?Cardiovascular: Regular rhythm and rate without murmur gallop or rub normal S1 and S2 ?Abdomen: negative abdominal pain, nondistended, positive soft, bowel sounds, no rebound, no ascites, no appreciable mass ?Extremities: No significant cyanosis, clubbing, or edema bilateral lower extremities ?Skin: Negative rashes, lesions, ulcers ?Psychiatric:  Negative depression, negative anxiety, negative fatigue, negative mania  ?Central  nervous system:  Cranial nerves II through XII intact, tongue/uvula midline, all extremities muscle strength 5/5, sensation intact throughout, negative dysarthria, negative expressive aphasia, negative receptiv

## 2021-04-01 DIAGNOSIS — D62 Acute posthemorrhagic anemia: Secondary | ICD-10-CM | POA: Diagnosis not present

## 2021-04-01 DIAGNOSIS — Z7189 Other specified counseling: Secondary | ICD-10-CM | POA: Diagnosis not present

## 2021-04-01 DIAGNOSIS — N179 Acute kidney failure, unspecified: Secondary | ICD-10-CM | POA: Diagnosis not present

## 2021-04-01 DIAGNOSIS — Z515 Encounter for palliative care: Secondary | ICD-10-CM

## 2021-04-01 DIAGNOSIS — J9601 Acute respiratory failure with hypoxia: Secondary | ICD-10-CM | POA: Diagnosis not present

## 2021-04-01 DIAGNOSIS — R7989 Other specified abnormal findings of blood chemistry: Secondary | ICD-10-CM | POA: Diagnosis not present

## 2021-04-01 LAB — CBC WITH DIFFERENTIAL/PLATELET
Abs Immature Granulocytes: 0.19 10*3/uL — ABNORMAL HIGH (ref 0.00–0.07)
Basophils Absolute: 0 10*3/uL (ref 0.0–0.1)
Basophils Relative: 0 %
Eosinophils Absolute: 0 10*3/uL (ref 0.0–0.5)
Eosinophils Relative: 0 %
HCT: 25.7 % — ABNORMAL LOW (ref 39.0–52.0)
Hemoglobin: 8.1 g/dL — ABNORMAL LOW (ref 13.0–17.0)
Immature Granulocytes: 1 %
Lymphocytes Relative: 9 %
Lymphs Abs: 1.2 10*3/uL (ref 0.7–4.0)
MCH: 28 pg (ref 26.0–34.0)
MCHC: 31.5 g/dL (ref 30.0–36.0)
MCV: 88.9 fL (ref 80.0–100.0)
Monocytes Absolute: 0.8 10*3/uL (ref 0.1–1.0)
Monocytes Relative: 5 %
Neutro Abs: 11.7 10*3/uL — ABNORMAL HIGH (ref 1.7–7.7)
Neutrophils Relative %: 85 %
Platelets: 304 10*3/uL (ref 150–400)
RBC: 2.89 MIL/uL — ABNORMAL LOW (ref 4.22–5.81)
RDW: 19.3 % — ABNORMAL HIGH (ref 11.5–15.5)
WBC: 13.9 10*3/uL — ABNORMAL HIGH (ref 4.0–10.5)
nRBC: 0 % (ref 0.0–0.2)

## 2021-04-01 LAB — COMPREHENSIVE METABOLIC PANEL
ALT: 153 U/L — ABNORMAL HIGH (ref 0–44)
AST: 75 U/L — ABNORMAL HIGH (ref 15–41)
Albumin: 1.9 g/dL — ABNORMAL LOW (ref 3.5–5.0)
Alkaline Phosphatase: 105 U/L (ref 38–126)
Anion gap: 8 (ref 5–15)
BUN: 37 mg/dL — ABNORMAL HIGH (ref 8–23)
CO2: 21 mmol/L — ABNORMAL LOW (ref 22–32)
Calcium: 7.6 mg/dL — ABNORMAL LOW (ref 8.9–10.3)
Chloride: 109 mmol/L (ref 98–111)
Creatinine, Ser: 2.57 mg/dL — ABNORMAL HIGH (ref 0.61–1.24)
GFR, Estimated: 22 mL/min — ABNORMAL LOW (ref >60)
Glucose, Bld: 113 mg/dL — ABNORMAL HIGH (ref 70–99)
Potassium: 4.2 mmol/L (ref 3.5–5.1)
Sodium: 138 mmol/L (ref 135–145)
Total Bilirubin: 0.5 mg/dL (ref 0.3–1.2)
Total Protein: 5.3 g/dL — ABNORMAL LOW (ref 6.5–8.1)

## 2021-04-01 LAB — GLUCOSE, CAPILLARY
Glucose-Capillary: 102 mg/dL — ABNORMAL HIGH (ref 70–99)
Glucose-Capillary: 109 mg/dL — ABNORMAL HIGH (ref 70–99)
Glucose-Capillary: 121 mg/dL — ABNORMAL HIGH (ref 70–99)
Glucose-Capillary: 131 mg/dL — ABNORMAL HIGH (ref 70–99)
Glucose-Capillary: 79 mg/dL (ref 70–99)
Glucose-Capillary: 99 mg/dL (ref 70–99)

## 2021-04-01 LAB — CULTURE, BLOOD (ROUTINE X 2)
Culture: NO GROWTH
Culture: NO GROWTH
Special Requests: ADEQUATE
Special Requests: ADEQUATE

## 2021-04-01 LAB — LEGIONELLA PNEUMOPHILA SEROGP 1 UR AG: L. pneumophila Serogp 1 Ur Ag: NEGATIVE

## 2021-04-01 LAB — MAGNESIUM: Magnesium: 2 mg/dL (ref 1.7–2.4)

## 2021-04-01 LAB — PHOSPHORUS: Phosphorus: 3.6 mg/dL (ref 2.5–4.6)

## 2021-04-01 MED ORDER — SODIUM CHLORIDE 0.9 % IV SOLN
3.0000 g | Freq: Two times a day (BID) | INTRAVENOUS | Status: DC
  Administered 2021-04-01 – 2021-04-03 (×5): 3 g via INTRAVENOUS
  Filled 2021-04-01 (×5): qty 8

## 2021-04-01 NOTE — Progress Notes (Signed)
?PROGRESS NOTE ? ? ? ?Jesus Kim  QVZ:[id number] DOB: 1924-01-27 DOA: 03/19/2021 ?PCP: [name], MD  ? ?Brief Narrative:  ?88 y.o. WM PMHx  HTN, OA, HLD, CKD 4  ? ?Presents after being called by his PCP to come to the emergency room due to abnormal lab work.  He reports he has had fatigue for the last few days and was going for his routine lab work with his PCP today.  States he has had decreased p.o. intake for the last 2 days due to his not feeling well.  He has not had any nausea, vomiting, diarrhea, fever.  He has no specific complaints and denies any chest pain, cough, shortness of breath, palpitations, confusion, urinary symptoms, muscle aches.  Lives at home with his wife.  No recent travel. ?No known sick contacts other than his wife has some mild nasal congestion. ?Was found to have AKI on repeat lab work with creatinine of 4.58 which is increased from his baseline of 2.4-2.5.  Hospitalist service was asked to admit for further management ? ? ?Subjective: ?3/21 afebrile overnight A/O x4.  Patient states ambulated but feels extremely unstable even with walker.  Agrees that he needs some rehab before going home. ? ? ?Assessment & Plan: ? Covid vaccination; vaccinated ? ?Principal Problem: ?  Acute kidney injury superimposed on CKD ([place]) ?Active Problems: ?  Hyperkalemia ?  Essential hypertension ?  Chronic hyponatremia ?  Acute blood loss anemia ?  Hemorrhagic shock ([place]) ?  Acute hypoxemic respiratory failure ([place]) ?  Shock liver ?  Elevated liver enzymes ?  Rectal pain ?  Hemorrhoids ? ?Acute kidney injury superimposed on CKD ([facility]) (baseline of 2.4-2.5) ?-Pt has known hx of urethral stricture with chronic bladder outlet obstruction and chronic B hydronephrosis ?-Urology consulted.  s/p foley placement, to remain in until f/u with Urology as outpt ?Lab Results  ?Component Value Date  ? CREATININE 2.57 (H) 04/01/2021  ? CREATININE 2.61 (H) 03/31/2021  ? CREATININE 2.69 (H) 03/30/2021  ? CREATININE 2.71  (H) 03/29/2021  ? CREATININE 2.78 (H) 03/28/2021  ?- 3/14 lactated Ringer's 77m/hr ? ?Severe Urosepsis/positive Proteus mirabilis ?-3/15 patient meets criteria for sepsis WBC> 12K, RR> 20, HR> 90, ?-Given patient's urethral stricture and chronic outlet obstruction treat as complicated UTI.  Complete 14 days of antibiotics ?-3/16 lactic acid= 2.1, continue to trend ?- 3/16 procalcitonin 1.13 continue to trend ? ?Leukocytosis ?- 3/19 unsure of cause, patient on appropriate antibiotic for urosepsis, negative fever, clinically patient significantly improved.  Will monitor 1 more day and if continues to climb or if patient develops any clinical symptoms will consult ID ?Lab Results  ?Component Value Date  ? WBC 13.9 (H) 04/01/2021  ? WBC 16.4 (H) 03/31/2021  ? WBC 34.2 (H) 03/30/2021  ? WBC 26.7 (H) 03/29/2021  ? WBC 36.8 (H) 03/28/2021  ? ? ?Essential HTN/Hypotension ?-Hold all BP medication.  Secondary to hemorrhagic shock. ?- 3/13 off pressors  ?-3/21 has not required addition of BP medication ? ?Sinus tachycardia vs A-fib with RVR ?- 3/16 EKG pending ? ?Acute hypoxemic respiratory failure ([place] ?-Baseline on room air ?-CXR reviewed, clear, VQ neg ?-3/12, required 6LNC likely secondary to marked anemia ?-3/16 treated with racemic epi without improvement and patient's respiratory status ?-3/16 stridor, tachypneic spoke with Dr. SHalford ChessmanPCCM we decided to intubate patient in order to allow him to rest. ?-3/19 strep pneumo urine antigen negative, Legionella urine antigen negative, sputum pending ? ? ?Hemorrhagic shock ([place] ?-3/12, SBP  into the 70's in setting of large GI bleed  ?-PCCM consulted. Required brief pressor support, now off pressors as of 3/13 ?  ?Acute blood loss anemia ?-On 3/12, noted to have large dark bloody BM with subsequent hypotension,Hgb down to 4.9. ?-3/12 transfuse 3 units PRBC  ?-3/13 s/p EGD findings of multiple ulcers without active bleed ?-Protonix 40 mg  BID ?Lab Results  ?Component Value Date   ? HGB 8.1 (L) 04/01/2021  ? HGB 8.1 (L) 03/31/2021  ? HGB 8.1 (L) 03/30/2021  ? HGB 8.2 (L) 03/29/2021  ? HGB 6.9 (LL) 03/29/2021  ?-3/18 transfuse 1 unit PRBC ? ?Shock liver/elevated liver enzymes ?- 3/15 elevated liver enzymes most likely secondary to patient's hemorrhagic shock however will also check acute hepatitis panel ?- 3/16 overnight patient's hepatic status has decompensated.  Unsure of cause, there was no hepatic toxic medication, negative hypotension.   ? ?Rectal pain/Hemorrhoids ?-Anusol suppositories BID ?-Lidocaine cream 0.4% BID ? ?Hyperkalemia ?-Likely secondary to renal failure ?-3/15, resolved  ? ?Hypomagnesmia ?- Magnesium goal> 2 ?- 3/16 magnesium IV 3 g ? ?Hypocalcemia ?- 3/20 corrected calcium= 9.3 WNL ? ?Chronic hyponatremia (baseline~130 to 133) ?-Improved with IVF ?-Asymptomatic  ? ?Goals of care ?- 3/20 PT/OT consult: Severe urosepsis, acute hypoxic respiratory failure, deconditioning evaluate for CIR vs SNF ?-3/21 Palliative Care consult:Patient with multisystem organ failure who is mentation has now cleared to the point where he understands his situation.  Evaluate for changing code to DNR, believe he would also consider hospice. ? ? ? ?DVT prophylaxis: SCD ?Code Status: Full ?Family Communication: 3/20 son-in-law at bedside for discussion of plan of care all questions answered ?Status is: Inpatient ? ? ? ?Dispo: The patient is from: Home ?             Anticipated d/c is to: Home ?             Anticipated d/c date is: 3 days ?             Patient currently is not medically stable to d/c. ? ? ? ? ? ?Consultants:  ?GI ?PCCM ? ? ?Procedures/Significant Events:  ?Renal US with findings suggesting chronic B hydronephrosis  ?3/16 intubated---> 3/18 ? ? ?I have personally reviewed and interpreted all radiology studies and my findings are as above. ? ?VENTILATOR SETTINGS: ?Room air 3/20 ?SPO2 98% ? ? ?Cultures ?3/14 urine positive Proteus mirabilis ?3/14 blood RIGHT AC NGTD ?3/14 blood LEFT  hand NGTD ?3/16 blood RIGHT hand NGTD  ?3/16 blood LEFT hand NGTD ?3/19 strep pneumo negative ?3/19 Legionella negative  ? ? ? ? ? ?Antimicrobials: ?Anti-infectives (From admission, onward)  ? ? Start     Ordered Stop  ? 04/01/21 1200  Ampicillin-Sulbactam (UNASYN) 3 g in sodium chloride 0.9 % 100 mL IVPB       ? 04/01/21 1128    ? 03/28/21 2000  piperacillin-tazobactam (ZOSYN) IVPB 2.25 g  Status:  Discontinued       ? 03/28/21 1503 04/01/21 1128  ? 03/28/21 1200  piperacillin-tazobactam (ZOSYN) IVPB 3.375 g  Status:  Discontinued       ? 03/28/21 1031 03/28/21 1503  ? 03/26/21 1000  cefTRIAXone (ROCEPHIN) 2 g in sodium chloride 0.9 % 100 mL IVPB  Status:  Discontinued       ? 03/25/21 1324 03/28/21 1031  ? 03/25/21 1400  cefTRIAXone (ROCEPHIN) 1 g in sodium chloride 0.9 % 100 mL IVPB       ? 03/25/21 1324 03/25/21 1409  ?  03/25/21 0830  cefTRIAXone (ROCEPHIN) 1 g in sodium chloride 0.9 % 100 mL IVPB  Status:  Discontinued       ? 03/25/21 0734 03/25/21 1329  ? ?  ?  ? ? ?Devices ?  ? ?LINES / TUBES:  ? ? ? ? ?Continuous Infusions: ? sodium chloride Stopped (03/28/21 1127)  ? lactated ringers 75 mL/hr at 03/31/21 0902  ? piperacillin-tazobactam (ZOSYN)  IV 2.25 g (04/01/21 0414)  ? ? ? ?Objective: ?Vitals:  ? 03/31/21 0344 03/31/21 1246 03/31/21 2009 04/01/21 0427  ?BP: 127/69 124/71 126/64 120/71  ?Pulse: 87 (!) 110 98 84  ?Resp: '20 20 19 17  '$ ?Temp: 97.9 ?F (36.6 ?C) 98.6 ?F (37 ?C) 98.3 ?F (36.8 ?C) 97.6 ?F (36.4 ?C)  ?TempSrc: Oral Oral Oral Oral  ?SpO2: 99% 98% 97% 98%  ?Weight:      ?Height:      ? ? ?Intake/Output Summary (Last 24 hours) at 04/01/2021 1047 ?Last data filed at 04/01/2021 0944 ?Gross per 24 hour  ?Intake 2563.61 ml  ?Output 4150 ml  ?Net -1586.39 ml  ? ? ?Filed Weights  ? 03/19/21 1648 03/28/21 1152  ?Weight: 70.3 kg 68 kg  ? ?Examination: ? ?General: A/O x4 negative acute respiratory distress, rigors ?Eyes: negative scleral hemorrhage, negative anisocoria, negative icterus ?ENT: Negative Runny  nose, negative gingival bleeding, ?Neck:  Negative scars, masses, torticollis, lymphadenopathy, JVD ?Lungs: clear to auscultation bilaterally, without wheezes or crackles ?Cardiovascular: Regular rhythm an

## 2021-04-01 NOTE — Consult Note (Signed)
? ?                                                                                ?Consultation Note ?Date: 04/01/2021  ? ?Patient Name: Jesus Kim  ?DOB: 11-Jun-1924  MRN: [id number]  Age / Sex: 88 y.o., male  ?PCP: Jesus Pepper, MD ?Referring Physician: Allie Bossier, MD ? ?Reason for Consultation: Establishing goals of care ? ?HPI/Patient Profile: 88 y.o. male  with past medical history of Hypertension, hyperlipidemia, CKD stage IV with baseline creatinine of 2.4-2.5, OA admitted on 03/19/2021 with AKI found on lab work by PCP.  He has had a complicated hospital course with development of urosepsis, GI bleed, respiratory failure requiring brief intubation, and chronic urethral stricture now with Foley placement.  Clinically, noted to be improving today consult placed for establishing long-term goals of care. ? ?Clinical Assessment and Goals of Care: ?I met today with Jesus Kim.  ? ?I introduced palliative care as specialized medical care for people living with serious illness. It focuses on providing relief from the symptoms and stress of a serious illness. The goal is to improve quality of life for both the patient and the family. ? ?We discussed the things most important to him.  He reports that his primary goal is to get home with his wife.  They have 3 daughters total including 2 who live in town.  Their third daughter is also visiting at this time as well.  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  He and his wife they moved to [place] for 20 years before moving back to [place].  They are active in their church but he reports they are "slowing down" overall.  Prior to this they were both active golfer's. ? ?We discussed clinical course as well as wishes moving forward in regard to advanced directives.  We discussed his care this hospitalization including episode of intubation.  He does not remember this episode but states it is not scary for him to think about  it.  States he would want to be intubated and if necessary.  Concepts specific to code status, care plan this hospitalization , and future hospitalization discussed.  We discussed difference between a aggressive medical intervention path and a palliative, comfort focused care path.  Values and goals of care important to patient and family were attempted to be elicited. ? ?He tells me that at this point in time, he would like to continue with full code/full scope treatment.  He reports that his hope is to live to help care for his wife and spend time with her and as long as she is alive he would want to continue with any and all aggressive interventions.  If, however, the time comes that she dies before he does, he would want to transition to DNR at that point. ?  ?Concept of palliative care and difference between this and hospice were discussed. ?  ?Questions and concerns addressed.   PMT will continue to support holistically.  ? ?SUMMARY OF RECOMMENDATIONS   ?-Full code/full scope ?-Plan to return home with home health and support of his daughters when he is  ready for discharge. ? ?Code Status/Advance Care Planning: ?Full code ?Additional Recommendations (Limitations, Scope, Preferences): ?Full Scope Treatment ? ?Psycho-social/Spiritual:  ?Desire for further Chaplaincy support:no ?Additional Recommendations: Caregiving  Support/Resources ? ?Prognosis:  ?Unable to determine ? ?Discharge Planning: Home with Home Health  ? ?  ? ?Primary Diagnoses: ?Present on Admission: ? Acute kidney injury superimposed on CKD ([place]) ? Chronic hyponatremia ? Essential hypertension ? Acute blood loss anemia ? Hemorrhagic shock ([place]) ? Acute hypoxemic respiratory failure ([place]) ? Shock liver ? Elevated liver enzymes ? Rectal pain ? Hemorrhoids ? ? ?I have reviewed the medical record, interviewed the patient and family, and examined the patient. The following aspects are pertinent. ? ?Past Medical History:  ?Diagnosis Date  ? Arthritis    ? Cancer [facility])   ? Colon  ? Hyperlipidemia   ? Hypertension   ? Melanoma ([place])   ? ?Social History  ? ?Socioeconomic History  ? Marital status: Married  ?  Spouse name: Not on file  ? Number of children: 3  ? Years of education: 75  ? Highest education level: Not on file  ?Occupational History  ? Not on file  ?Tobacco Use  ? Smoking status: Former  ?  Types: Cigarettes  ?  Quit date: 01/31/1977  ?  Years since quitting: 44.1  ? Smokeless tobacco: Never  ?Vaping Use  ? Vaping Use: Never used  ?Substance and Sexual Activity  ? Alcohol use: Yes  ?  Alcohol/week: 1.0 standard drink  ?  Types: 1 Glasses of wine per week  ?  Comment: occasionally  ? Drug use: No  ? Sexual activity: Not on file  ?Other Topics Concern  ? Not on file  ?Social History Narrative  ? Not on file  ? ?Social Determinants of Health  ? ?Financial Resource Strain: Not on file  ?Food Insecurity: Not on file  ?Transportation Needs: Not on file  ?Physical Activity: Not on file  ?Stress: Not on file  ?Social Connections: Not on file  ? ?History reviewed. No pertinent family history. ?Scheduled Meds: ? chlorhexidine  15 mL Mouth Rinse BID  ? Chlorhexidine Gluconate Cloth  6 each Topical Daily  ? finasteride  5 mg Oral Daily  ? gabapentin  200 mg Oral QHS  ? hydrocortisone  25 mg Rectal BID  ? insulin aspart  0-15 Units Subcutaneous Q4H  ? lidocaine   Topical BID  ? mouth rinse  15 mL Mouth Rinse q12n4p  ? multivitamin  1 tablet Oral Daily  ? pantoprazole (PROTONIX) IV  40 mg Intravenous Q12H  ? simvastatin  20 mg Per Tube Daily  ? ?Continuous Infusions: ? sodium chloride Stopped (03/28/21 1127)  ? ampicillin-sulbactam (UNASYN) IV 3 g (04/01/21 1300)  ? lactated ringers 75 mL/hr at 03/31/21 0902  ? ?PRN Meds:.acetaminophen, acetaminophen, levalbuterol, lip balm, [DISCONTINUED] ondansetron **OR** ondansetron (ZOFRAN) IV, senna-docusate ?Medications Prior to Admission:  ?Prior to Admission medications   ?Medication Sig Start Date End Date Taking? Authorizing  Provider  ?acetaminophen (TYLENOL) 650 MG CR tablet Take 650 mg by mouth every 8 (eight) hours as needed for pain.   Yes [provider]  ?amLODipine (NORVASC) 2.5 MG tablet Take 2.5 mg by mouth 2 (two) times daily.   Yes [provider]  ?diphenhydramine-acetaminophen (TYLENOL PM) 25-500 MG TABS tablet Take 1 tablet by mouth at bedtime.   Yes [provider]  ?finasteride (PROSCAR) 5 MG tablet Take 5 mg by mouth daily.   Yes [provider]  ?  gabapentin (NEURONTIN) 100 MG capsule Take 2 capsules (200 mg total) by mouth at bedtime. 10/02/20  Yes Gregor Hams, MD  ?lisinopril (ZESTRIL) 20 MG tablet Take 20 mg by mouth daily.   Yes [provider]  ?Multiple Vitamins-Minerals (PRESERVISION AREDS 2+MULTI VIT PO) Take 2 tablets by mouth daily.   Yes [provider]  ?simvastatin (ZOCOR) 20 MG tablet Take 20 mg by mouth daily.   Yes [provider]  ? ?Allergies  ?Allergen Reactions  ? Sulfa Antibiotics Rash  ? ?Review of Systems  ?Gastrointestinal:  Positive for blood in stool.  ?     Rectal discomfort  ?Genitourinary:  Positive for difficulty urinating.  ? ?Physical Exam ?General: Alert, awake, in no acute distress.   ?HEENT: No bruits, no goiter, no JVD ?Heart: Regular rate and rhythm. No murmur appreciated. ?Lungs: Good air movement, clear ?Abdomen: Soft, nontender, nondistended, positive bowel sounds.   ?Ext: No significant edema ?Skin: Warm and dry ?Neuro: Grossly intact, nonfocal. ? ? ?Vital Signs: BP 135/69 (BP Location: Right Arm)   Pulse 78   Temp 98.1 ?F (36.7 ?C) (Oral)   Resp (!) 24   Ht '5\' 6"'  (1.676 m)   Wt 68 kg   SpO2 98%   BMI 24.20 kg/m?  ?Pain Scale: 0-10 ?  ?Pain Score: 0-No pain ? ? ?SpO2: SpO2: 98 % ?O2 Device:SpO2: 98 % ?O2 Flow Rate: .O2 Flow Rate (L/min): 4 L/min ? ?IO: Intake/output summary:  ?Intake/Output Summary (Last 24 hours) at 04/01/2021 1802 ?Last data filed at 04/01/2021 0944 ?Gross per 24 hour  ?Intake 2563.61 ml   ?Output 3600 ml  ?Net -1036.39 ml  ? ? ?LBM: Last BM Date : 03/29/21 ?Baseline Weight: Weight: 70.3 kg ?Most recent weight: Weight: 68 kg     ?Palliative Assessment/Data: ? ? ?Flowsheet Rows   ? ?[place]

## 2021-04-01 NOTE — Evaluation (Signed)
Physical Therapy Evaluation ?Patient Details ?Name: Jesus Kim ?MRN: [id number] ?DOB: [birth year] ?Today's Date: 04/01/2021 ? ?History of Present Illness ? 88 yo male admitted with AKI on CKD. During hospital stay, diagnosed with anemia, severe urosepsis. Intubated 3/16. Extubated 3/17. Hx of urethral stricture, bilateral hydronephrosis, CKD. Initial PT eval on 03/22/21. Re-evaluation 04/01/21.  ?Clinical Impression ? On re-evaluation, pt required Min A +2 safety/equipment for mobility. He walked ~40 feet with a RW. Pt presents with general weakness, decreased activity tolerance, and impaired gait and balance. He is having quite a bit of discomfort in genital and buttocks areas. O2 >90%, HR up to 122 bpm, and dyspnea 2.5/4 with ambulation on today. He is at risk for falls when ambulating. Discussed d/c plan with family-they prefer d/c home once medically stable. They are arranging as much in home assistance as they possibly can. Will plan to follow and progress activity as tolerated. ?   ?   ? ?Recommendations for follow up therapy are one component of a multi-disciplinary discharge planning process, led by the attending physician.  Recommendations may be updated based on patient status, additional functional criteria and insurance authorization. ? ?Follow Up Recommendations Home health PT (family politely declines placement. They are working on setting up as much care as they possibly can.) ? ?  ?Assistance Recommended at Discharge Frequent or constant Supervision/Assistance  ?Patient can return home with the following ? A little help with walking and/or transfers;A little help with bathing/dressing/bathroom;Assistance with cooking/housework;Assist for transportation;Help with stairs or ramp for entrance ? ?  ?Equipment Recommendations Rolling walker (2 wheels);BSC/3in1  ?Recommendations for Other Services ?    ?  ?Functional Status Assessment Patient has had a recent decline in their functional status and demonstrates  the ability to make significant improvements in function in a reasonable and predictable amount of time.  ? ?  ?Precautions / Restrictions Precautions ?Precautions: Fall ?Precaution Comments: monitor HR ?Restrictions ?Weight Bearing Restrictions: No  ? ?  ? ?Mobility ? Bed Mobility ?Overal bed mobility: Needs Assistance ?Bed Mobility: Supine to Sit ?  ?  ?Supine to sit: HOB elevated, Min assist ?  ?  ?General bed mobility comments: Min A to get to EOB. Increased time. ?  ? ?Transfers ?Overall transfer level: Needs assistance ?Equipment used: Rolling walker (2 wheels) ?Transfers: Sit to/from Stand ?Sit to Stand: Min assist ?  ?  ?  ?  ?  ?General transfer comment: Assist to steady. Cues for safety, technique, hand placement. ?  ? ?Ambulation/Gait ?Ambulation/Gait assistance: Min assist, +2 safety/equipment ?Gait Distance (Feet): 40 Feet ?Assistive device: Rolling walker (2 wheels) ?Gait Pattern/deviations: Step-through pattern, Decreased stride length ?  ?  ?  ?General Gait Details: Unsteady with general bil LE weakness. Fall risk when ambulating. Followed closely with recliner. O2 >90% on RA, HR up to 122 bpm, dyspnea 2.5/4. ? ?Stairs ?  ?  ?  ?  ?  ? ?Wheelchair Mobility ?  ? ?Modified Rankin (Stroke Patients Only) ?  ? ?  ? ?Balance Overall balance assessment: Needs assistance ?  ?  ?  ?  ?Standing balance support: Bilateral upper extremity supported, Reliant on assistive device for balance, During functional activity ?Standing balance-Leahy Scale: Poor ?  ?  ?  ?  ?  ?  ?  ?  ?  ?  ?  ?  ?   ? ? ? ?Pertinent Vitals/Pain Pain Assessment ?Pain Assessment: Faces ?Pain Location: scrotum (red/edema) and buttocks ?Pain Descriptors / Indicators: Grimacing, Guarding ?  Pain Intervention(s): Monitored during session, Repositioned  ? ? ?Home Living Family/patient expects to be discharged to:: Private residence ?Living Arrangements: Spouse/significant other ?Available Help at Discharge: Family ?Type of Home: Apartment ?Home  Access: Level entry ?  ?  ?  ?Home Layout: One level ?Home Equipment: Conservation officer, nature (2 wheels);Cane - single point ?   ?  ?Prior Function Prior Level of Function : Independent/Modified Independent ?  ?  ?  ?  ?  ?  ?Mobility Comments: normally ambulatory without a device. still drives ?  ?  ? ? ?Hand Dominance  ?   ? ?  ?Extremity/Trunk Assessment  ? Upper Extremity Assessment ?Upper Extremity Assessment: Defer to OT evaluation ?  ? ?Lower Extremity Assessment ?Lower Extremity Assessment: Generalized weakness ?  ? ?Cervical / Trunk Assessment ?Cervical / Trunk Assessment: Kyphotic  ?Communication  ? Communication: HOH  ?Cognition Arousal/Alertness: Awake/alert ?Behavior During Therapy: [facility] for tasks assessed/performed ?Overall Cognitive Status: Impaired/Different from baseline ?Area of Impairment: Orientation ?  ?  ?  ?  ?  ?  ?  ?  ?Orientation Level: Time ?  ?  ?  ?  ?  ?  ?General Comments: a little confused on date ?  ?  ? ?  ?General Comments   ? ?  ?Exercises    ? ?Assessment/Plan  ?  ?PT Assessment Patient needs continued PT services  ?PT Problem List Decreased strength;Decreased mobility;Decreased activity tolerance;Decreased balance;Decreased knowledge of use of DME;Pain ? ?   ?  ?PT Treatment Interventions DME instruction;Balance training;Therapeutic exercise;Gait training;Functional mobility training;Therapeutic activities;Patient/family education   ? ?PT Goals (Current goals can be found in the Care Plan section)  ?Acute Rehab PT Goals ?Patient Stated Goal: to feel better and regain independence/PLOF. to go home. ?PT Goal Formulation: With patient/family ?Time For Goal Achievement: 04/15/21 ?Potential to Achieve Goals: Good ? ?  ?Frequency Min 3X/week ?  ? ? ?Co-evaluation   ?  ?  ?  ?  ? ? ?  ?AM-PAC PT "6 Clicks" Mobility  ?Outcome Measure Help needed turning from your back to your side while in a flat bed without using bedrails?: A Little ?Help needed moving from lying on your back to sitting on the  side of a flat bed without using bedrails?: A Little ?Help needed moving to and from a bed to a chair (including a wheelchair)?: A Little ?Help needed standing up from a chair using your arms (e.g., wheelchair or bedside chair)?: A Little ?Help needed to walk in hospital room?: A Lot ?Help needed climbing 3-5 steps with a railing? : A Lot ?6 Click Score: 16 ? ?  ?End of Session Equipment Utilized During Treatment: Gait belt ?Activity Tolerance: Patient tolerated treatment well;Patient limited by fatigue ?Patient left: in chair;with call bell/phone within reach;with family/visitor present ?  ?PT Visit Diagnosis: Muscle weakness (generalized) (M62.81);Unsteadiness on feet (R26.81);Difficulty in walking, not elsewhere classified (R26.2);Pain ?Pain - part of body:  (scrotum, buttocks) ?  ? ?Time: 1655-3748 ?PT Time Calculation (min) (ACUTE ONLY): 19 min ? ? ?Charges:   PT Evaluation ?$PT Re-evaluation: 1 Re-eval ?  ?  ?   ? ? ? ? ?Doreatha Massed, PT ?Acute Rehabilitation  ?Office: [phone] ?Pager: [phone] ? ?  ? ?

## 2021-04-01 NOTE — Evaluation (Signed)
Occupational Therapy Evaluation ?Patient Details ?Name: Jesus Kim ?MRN: [id number] ?DOB: [birth year] ?Today's Date: 04/01/2021 ? ? ?History of Present Illness patient is a 88 year old male who presented to the hosptial after recommendation from PCP with abnormal lab work. patient was found to have AKI, severe urosepsis, positive proteus mirabilis, GI bleed with melena, acute blood loss anemia,  patient has 1 unit PRBC transfusion on 3/18. PMH: colon cancer, CKD,hyperlipidemia, melanoma, arthritis  ? ?Clinical Impression ?  ?Patient was noted to have been admitted for above. Patient was noted to have increased fatigue, decreased functional activity tolerance, decreased endurance, decreased strength and decreased safety awareness impacting participation in ADLs. Patient was min A +2 for standing balance and transfers with RW on this date with increased cues for safety. Patient and family in room report that plan is to take patient home at time of d/c from hospital. Patient will need 24/7 physical assistance in next level of care to be successful. Patient would continue to benefit from skilled OT services at this time while admitted and after d/c to address noted deficits in order to improve overall safety and independence in ADLs.  ? ?   ? ?Recommendations for follow up therapy are one component of a multi-disciplinary discharge planning process, led by the attending physician.  Recommendations may be updated based on patient status, additional functional criteria and insurance authorization.  ? ?Follow Up Recommendations ? Skilled nursing-short term rehab (<3 hours/day)  ?  ?Assistance Recommended at Discharge Frequent or constant Supervision/Assistance  ?Patient can return home with the following A lot of help with walking and/or transfers;A lot of help with bathing/dressing/bathroom;Assistance with cooking/housework;Direct supervision/assist for financial management;Assist for transportation;Help with stairs or ramp  for entrance;Direct supervision/assist for medications management ? ?  ?Functional Status Assessment ? Patient has had a recent decline in their functional status and demonstrates the ability to make significant improvements in function in a reasonable and predictable amount of time.  ?Equipment Recommendations ? BSC/3in1  ?  ?Recommendations for Other Services   ? ? ?  ?Precautions / Restrictions Precautions ?Precautions: Fall ?Precaution Comments: monitor HR ?Restrictions ?Weight Bearing Restrictions: No  ? ?  ? ?Mobility Bed Mobility ?  ?  ?  ?  ?  ?  ?  ?  ?  ? ?Transfers ?  ?  ?  ?  ?  ?  ?  ?  ?  ?  ?  ? ?  ?Balance Overall balance assessment: Needs assistance ?Sitting-balance support: No upper extremity supported, Feet supported ?Sitting balance-Leahy Scale: Fair ?  ?  ?Standing balance support: Bilateral upper extremity supported, Reliant on assistive device for balance, During functional activity ?Standing balance-Leahy Scale: Poor ?  ?  ?  ?  ?  ?  ?  ?  ?  ?  ?  ?  ?   ? ?ADL either performed or assessed with clinical judgement  ? ?ADL Overall ADL's : Needs assistance/impaired ?Eating/Feeding: Set up;Sitting ?  ?Grooming: Wash/dry face;Sitting ?  ?Upper Body Bathing: Sitting;Minimal assistance ?  ?Lower Body Bathing: Sit to/from stand;Moderate assistance;Sitting/lateral leans ?  ?Upper Body Dressing : Minimal assistance;Sitting ?  ?Lower Body Dressing: Sit to/from stand;Sitting/lateral leans;Moderate assistance ?Lower Body Dressing Details (indicate cue type and reason): patient was able to don/doff socks sititng in recliner with bringing ankles to lap ?Toilet Transfer: Moderate assistance;+2 for safety/equipment;Ambulation ?Toilet Transfer Details (indicate cue type and reason): patient was able to transfer from edge of bed to recliner ?  ?  ?  ?  ?  ?   ? ? ? ?  Vision Patient Visual Report: No change from baseline ?   ?   ?Perception   ?  ?Praxis   ?  ? ?Pertinent Vitals/Pain Pain Assessment ?Pain  Assessment: Faces ?Faces Pain Scale: Hurts a little bit ?Pain Location: scrotum (red/edema) and buttocks ?Pain Descriptors / Indicators: Grimacing, Guarding ?Pain Intervention(s): Monitored during session, Repositioned  ? ? ? ?Hand Dominance Right ?  ?Extremity/Trunk Assessment Upper Extremity Assessment ?Upper Extremity Assessment: RUE deficits/detail;LUE deficits/detail ?RUE Deficits / Details: ROM WFL, strength 4/5 shoulders ?LUE Deficits / Details: ROM WFL, strength 4/5 shoulders ?  ?Lower Extremity Assessment ?Lower Extremity Assessment: Defer to PT evaluation ?  ?Cervical / Trunk Assessment ?Cervical / Trunk Assessment: Kyphotic ?  ?Communication Communication ?Communication: HOH ?  ?Cognition Arousal/Alertness: Awake/alert ?Behavior During Therapy: [facility] for tasks assessed/performed ?Overall Cognitive Status: Impaired/Different from baseline ?Area of Impairment: Orientation ?  ?  ?  ?  ?  ?  ?  ?  ?Orientation Level: Time ?  ?  ?  ?  ?  ?  ?General Comments: a little confused on date ?  ?  ?General Comments    ? ?  ?Exercises   ?  ?Shoulder Instructions    ? ? ?Home Living Family/patient expects to be discharged to:: Private residence ?Living Arrangements: Spouse/significant other ?Available Help at Discharge: Family ?Type of Home: Apartment ?Home Access: Level entry ?  ?  ?Home Layout: One level ?  ?  ?  ?  ?Bathroom Toilet: Standard ?  ?  ?Home Equipment: Conservation officer, nature (2 wheels);Cane - single point ?  ?  ?  ? ?  ?Prior Functioning/Environment Prior Level of Function : Independent/Modified Independent ?  ?  ?  ?  ?  ?  ?Mobility Comments: normally ambulatory without a device. still drives ?  ?  ? ?  ?  ?OT Problem List: Decreased activity tolerance;Impaired balance (sitting and/or standing);Decreased safety awareness;Cardiopulmonary status limiting activity;Decreased knowledge of precautions;Decreased knowledge of use of DME or AE ?  ?   ?OT Treatment/Interventions: Self-care/ADL training;Therapeutic  exercise;Neuromuscular education;Energy conservation;DME and/or AE instruction;Therapeutic activities;Balance training;Patient/family education  ?  ?OT Goals(Current goals can be found in the care plan section) Acute Rehab OT Goals ?Patient Stated Goal: to get back home ?OT Goal Formulation: With patient ?Time For Goal Achievement: 04/15/21 ?Potential to Achieve Goals: Good  ?OT Frequency: Min 2X/week ?  ? ?Co-evaluation PT/OT/SLP Co-Evaluation/Treatment: Yes ?Reason for Co-Treatment: For patient/therapist safety ?PT goals addressed during session: Mobility/safety with mobility ?OT goals addressed during session: ADL's and self-care ?  ? ?  ?AM-PAC OT "6 Clicks" Daily Activity     ?Outcome Measure Help from another person eating meals?: A Little ?Help from another person taking care of personal grooming?: A Little ?Help from another person toileting, which includes using toliet, bedpan, or urinal?: A Lot ?Help from another person bathing (including washing, rinsing, drying)?: A Lot ?Help from another person to put on and taking off regular upper body clothing?: A Little ?Help from another person to put on and taking off regular lower body clothing?: A Little ?6 Click Score: 16 ?  ?End of Session Equipment Utilized During Treatment: Gait belt;Rolling walker (2 wheels) ?Nurse Communication: Mobility status ? ?Activity Tolerance: Patient tolerated treatment well ?Patient left: in chair;with call bell/phone within reach;with family/visitor present ? ?OT Visit Diagnosis: Unsteadiness on feet (R26.81);Other abnormalities of gait and mobility (R26.89)  ?              ?Time: 3382-5053 ?OT Time Calculation (  min): 19 min ?Charges:  OT General Charges ?$OT Visit: 1 Visit ?OT Evaluation ?$OT Eval Moderate Complexity: 1 Mod ? ?Adithya Difrancesco OTR/L, MS ?Acute Rehabilitation Department ?Office# [phone] ?Pager# [phone] ? ? ?Feliz Beam Alexee Delsanto ?04/01/2021, 4:32 PM ?

## 2021-04-02 DIAGNOSIS — N179 Acute kidney failure, unspecified: Secondary | ICD-10-CM | POA: Diagnosis not present

## 2021-04-02 DIAGNOSIS — N189 Chronic kidney disease, unspecified: Secondary | ICD-10-CM | POA: Diagnosis not present

## 2021-04-02 LAB — GLUCOSE, CAPILLARY
Glucose-Capillary: 96 mg/dL (ref 70–99)
Glucose-Capillary: 98 mg/dL (ref 70–99)
Glucose-Capillary: 105 mg/dL — ABNORMAL HIGH (ref 70–99)
Glucose-Capillary: 104 mg/dL — ABNORMAL HIGH (ref 70–99)
Glucose-Capillary: 128 mg/dL — ABNORMAL HIGH (ref 70–99)

## 2021-04-02 LAB — CBC WITH DIFFERENTIAL/PLATELET
Abs Immature Granulocytes: 0.28 10*3/uL — ABNORMAL HIGH (ref 0.00–0.07)
Basophils Absolute: 0.1 10*3/uL (ref 0.0–0.1)
Basophils Relative: 0 %
Eosinophils Absolute: 0.1 10*3/uL (ref 0.0–0.5)
Eosinophils Relative: 1 %
HCT: 27.7 % — ABNORMAL LOW (ref 39.0–52.0)
Hemoglobin: 8.8 g/dL — ABNORMAL LOW (ref 13.0–17.0)
Immature Granulocytes: 2 %
Lymphocytes Relative: 14 %
Lymphs Abs: 2 10*3/uL (ref 0.7–4.0)
MCH: 28.1 pg (ref 26.0–34.0)
MCHC: 31.8 g/dL (ref 30.0–36.0)
MCV: 88.5 fL (ref 80.0–100.0)
Monocytes Absolute: 1.1 10*3/uL — ABNORMAL HIGH (ref 0.1–1.0)
Monocytes Relative: 8 %
Neutro Abs: 10.8 10*3/uL — ABNORMAL HIGH (ref 1.7–7.7)
Neutrophils Relative %: 75 %
Platelets: 340 10*3/uL (ref 150–400)
RBC: 3.13 MIL/uL — ABNORMAL LOW (ref 4.22–5.81)
RDW: 19 % — ABNORMAL HIGH (ref 11.5–15.5)
WBC: 14.5 10*3/uL — ABNORMAL HIGH (ref 4.0–10.5)
nRBC: 0 % (ref 0.0–0.2)

## 2021-04-02 LAB — BPAM RBC
Blood Product Expiration Date: 202304092359
Blood Product Expiration Date: 202304092359
ISSUE DATE / TIME: 202303181022
Unit Type and Rh: 6200
Unit Type and Rh: 6200

## 2021-04-02 LAB — TYPE AND SCREEN
ABO/RH(D): A POS
Antibody Screen: NEGATIVE
Unit division: 0
Unit division: 0

## 2021-04-02 LAB — COMPREHENSIVE METABOLIC PANEL
ALT: 114 U/L — ABNORMAL HIGH (ref 0–44)
AST: 49 U/L — ABNORMAL HIGH (ref 15–41)
Albumin: 2 g/dL — ABNORMAL LOW (ref 3.5–5.0)
Alkaline Phosphatase: 91 U/L (ref 38–126)
Anion gap: 8 (ref 5–15)
BUN: 29 mg/dL — ABNORMAL HIGH (ref 8–23)
CO2: 21 mmol/L — ABNORMAL LOW (ref 22–32)
Calcium: 7.6 mg/dL — ABNORMAL LOW (ref 8.9–10.3)
Chloride: 107 mmol/L (ref 98–111)
Creatinine, Ser: 2.29 mg/dL — ABNORMAL HIGH (ref 0.61–1.24)
GFR, Estimated: 26 mL/min — ABNORMAL LOW (ref >60)
Glucose, Bld: 108 mg/dL — ABNORMAL HIGH (ref 70–99)
Potassium: 3.7 mmol/L (ref 3.5–5.1)
Sodium: 136 mmol/L (ref 135–145)
Total Bilirubin: 0.2 mg/dL — ABNORMAL LOW (ref 0.3–1.2)
Total Protein: 5.4 g/dL — ABNORMAL LOW (ref 6.5–8.1)

## 2021-04-02 LAB — MAGNESIUM: Magnesium: 1.8 mg/dL (ref 1.7–2.4)

## 2021-04-02 LAB — PHOSPHORUS: Phosphorus: 2.6 mg/dL (ref 2.5–4.6)

## 2021-04-02 MED ORDER — ACETAMINOPHEN 325 MG PO TABS
650.0000 mg | ORAL_TABLET | Freq: Four times a day (QID) | ORAL | Status: DC | PRN
  Administered 2021-04-04 – 2021-04-06 (×3): 650 mg via ORAL
  Filled 2021-04-02 (×3): qty 2

## 2021-04-02 MED ORDER — SIMVASTATIN 20 MG PO TABS
20.0000 mg | ORAL_TABLET | Freq: Every day | ORAL | Status: DC
  Administered 2021-04-03 – 2021-04-06 (×4): 20 mg via ORAL
  Filled 2021-04-02 (×4): qty 1

## 2021-04-02 MED ORDER — INSULIN ASPART 100 UNIT/ML IJ SOLN
0.0000 [IU] | Freq: Three times a day (TID) | INTRAMUSCULAR | Status: DC
  Administered 2021-04-03: 2 [IU] via SUBCUTANEOUS
  Administered 2021-04-04: 3 [IU] via SUBCUTANEOUS
  Administered 2021-04-05: 2 [IU] via SUBCUTANEOUS

## 2021-04-02 MED ORDER — PANTOPRAZOLE SODIUM 40 MG PO TBEC
40.0000 mg | DELAYED_RELEASE_TABLET | Freq: Two times a day (BID) | ORAL | Status: DC
  Administered 2021-04-02 – 2021-04-06 (×8): 40 mg via ORAL
  Filled 2021-04-02 (×8): qty 1

## 2021-04-02 NOTE — Progress Notes (Signed)
?PROGRESS NOTE ? ? ? ?Jesus Kim  AVW:[id number] DOB: [birth year] DOA: 03/19/2021 ?PCP: [name], MD  ? ?  ?Brief Narrative:  ?Jesus Kim is a 88 year old male with past medical history significant for CKD stage IV, hypertension, osteoarthritis, hyperlipidemia who presented to the hospital after being called by his PCP to present due to abnormal lab work.  He complained of decreased p.o. intake for the past couple of days and not feeling well.  Lab work revealed a creatinine of 4.58 which is increased from his baseline of 2.4-2.5.  Urology was consulted due to patient's history of ureteral stricture with chronic bladder outlet obstruction and chronic bilateral hydronephrosis.  Foley was placed.  Patient also met criteria for severe sepsis on admission and was treated with antibiotics.  Hospitalization further complicated by respiratory failure requiring intubation on 3/16.  He was extubated 3/17.  He also briefly required vasopressor support which was weaned on 3/13.  He had acute blood loss anemia and required 3 unit packed red blood cell on 3/12, 1 unit packed red blood cell 3/18.  He underwent EGD 3/13 which showed multiple ulcers without active bleed.  He also developed shock liver. ? ?New events last 24 hours / Subjective: ?Patient is sitting in a recliner.  He has no complaints of abdominal pain, nausea, vomiting or shortness of breath.  He is weak, but is ambulating with a walker.  He has declined SNF placement and working on discharging home with home health.  Overall feeling well and has been afebrile. ? ?Assessment & Plan: ?  ?Principal Problem: ?  Acute kidney injury superimposed on CKD ([place]) ?Active Problems: ?  Hyperkalemia ?  Essential hypertension ?  Chronic hyponatremia ?  Acute blood loss anemia ?  Hemorrhagic shock ([place]) ?  Acute hypoxemic respiratory failure ([place]) ?  Shock liver ?  Elevated liver enzymes ?  Rectal pain ?  Hemorrhoids ? ? ?AKI on CKD 4 ?-With bilateral chronic hydronephrosis  and pan urethral stricture  ?-Baseline creatinine 2.1-2.6 ?-Urology consulted, Dr. Abner Greenspan  ?-Continue proscar  ?-Foley placement until outpatient follow-up with urology ? ?Severe sepsis secondary to Proteus UTI ?-Sepsis not present on admission, met sepsis criteria 3/15 ?-Urine culture 3/14 revealed Proteus mirabilis ?-Blood cultures remain negative ?-Continue Unasyn ?-Continued leukocytosis, repeat CBC in the morning ? ?Hemorrhagic shock in setting of GI bleed, acute blood loss anemia ?-Status post 3 unit prbc 3/12, 1 unit prbc 3/18  ?-Status post EGD 3/13 which revealed multiple ulcers without active bleed ?-GI signed off 3/20 ?-PPI  ? ?Hypertension ?-Now off blood pressure medications due to hemorrhagic shock ? ?Acute hypoxemic respiratory failure ?-Required intubation 3/16, extubated 3/17 ?-Now on room air ? ?Shock liver ?-LFTs improving ? ?HLD ?-Zocor  ? ?DVT prophylaxis:  ?Place and maintain sequential compression device Start: 03/24/21 0816 ? ?Code Status: Full ?Family Communication: Son in Sports coach at bedside  ?Disposition Plan:  ?Status is: Inpatient ?Remains inpatient appropriate because: remains on IV antibiotics ? ? ?Antimicrobials:  ?Anti-infectives (From admission, onward)  ? ? Start     Dose/Rate Route Frequency Ordered Stop  ? 04/01/21 1200  Ampicillin-Sulbactam (UNASYN) 3 g in sodium chloride 0.9 % 100 mL IVPB       ? 3 g ?200 mL/hr over 30 Minutes Intravenous Every 12 hours 04/01/21 1128    ? 03/28/21 2000  piperacillin-tazobactam (ZOSYN) IVPB 2.25 g  Status:  Discontinued       ? 2.25 g ?100 mL/hr over 30 Minutes Intravenous Every  8 hours 03/28/21 1503 04/01/21 1128  ? 03/28/21 1200  piperacillin-tazobactam (ZOSYN) IVPB 3.375 g  Status:  Discontinued       ? 3.375 g ?12.5 mL/hr over 240 Minutes Intravenous Every 8 hours 03/28/21 1031 03/28/21 1503  ? 03/26/21 1000  cefTRIAXone (ROCEPHIN) 2 g in sodium chloride 0.9 % 100 mL IVPB  Status:  Discontinued       ? 2 g ?200 mL/hr over 30 Minutes Intravenous  Every 24 hours 03/25/21 1324 03/28/21 1031  ? 03/25/21 1400  cefTRIAXone (ROCEPHIN) 1 g in sodium chloride 0.9 % 100 mL IVPB       ? 1 g ?200 mL/hr over 30 Minutes Intravenous  Once 03/25/21 1324 03/25/21 1409  ? 03/25/21 0830  cefTRIAXone (ROCEPHIN) 1 g in sodium chloride 0.9 % 100 mL IVPB  Status:  Discontinued       ? 1 g ?200 mL/hr over 30 Minutes Intravenous Every 24 hours 03/25/21 0734 03/25/21 1329  ? ?  ? ? ? ?Objective: ?Vitals:  ? 04/01/21 1200 04/01/21 2255 04/02/21 0545 04/02/21 1137  ?BP: 135/69 (!) 141/80 (!) 146/77 (!) 154/79  ?Pulse: 78 83 91 (!) 104  ?Resp: (!) '24 18 18 20  ' ?Temp: 98.1 ?F (36.7 ?C) 97.8 ?F (36.6 ?C) 97.8 ?F (36.6 ?C) 98.8 ?F (37.1 ?C)  ?TempSrc: Oral Oral Oral Oral  ?SpO2: 98% 99% 100% 99%  ?Weight:      ?Height:      ? ? ?Intake/Output Summary (Last 24 hours) at 04/02/2021 1320 ?Last data filed at 04/02/2021 0746 ?Gross per 24 hour  ?Intake 1893.1 ml  ?Output 2850 ml  ?Net -956.9 ml  ? ?Filed Weights  ? 03/19/21 1648 03/28/21 1152  ?Weight: 70.3 kg 68 kg  ? ? ?Examination:  ?General exam: Appears calm and comfortable  ?Respiratory system: Clear to auscultation. Respiratory effort normal. No respiratory distress. No conversational dyspnea.  ?Cardiovascular system: S1 & S2 heard, RRR. No murmurs. No pedal edema. ?Gastrointestinal system: Abdomen is nondistended, soft and nontender. Normal bowel sounds heard. ?Central nervous system: Alert and oriented. No focal neurological deficits. Speech clear.  ?Extremities: Symmetric in appearance  ?Skin: No rashes, lesions or ulcers on exposed skin  ?Psychiatry: Judgement and insight appear normal. Mood & affect appropriate.  ? ?Data Reviewed: I have personally reviewed following labs and imaging studies ? ?CBC: ?Recent Labs  ?Lab 03/29/21 ?4098 03/29/21 ?1539 03/30/21 ?0339 03/31/21 ?1191 04/01/21 ?4782 04/02/21 ?9562  ?WBC 26.7*  --  34.2* 16.4* 13.9* 14.5*  ?NEUTROABS 25.2*  --  33.9* 13.7* 11.7* 10.8*  ?HGB 6.9* 8.2* 8.1* 8.1* 8.1* 8.8*  ?HCT  21.4* 25.8* 25.2* 25.7* 25.7* 27.7*  ?MCV 91.5  --  87.8 88.9 88.9 88.5  ?PLT 297  --  317 315 304 340  ? ?Basic Metabolic Panel: ?Recent Labs  ?Lab 03/29/21 ?1308 03/30/21 ?6578 03/31/21 ?4696 04/01/21 ?2952 04/02/21 ?8413  ?NA 134* 135 135 138 136  ?K 4.2 4.4 4.0 4.2 3.7  ?CL 106 108 109 109 107  ?CO2 16* 18* 19* 21* 21*  ?GLUCOSE 144* 122* 85 113* 108*  ?BUN 42* 48* 43* 37* 29*  ?CREATININE 2.71* 2.69* 2.61* 2.57* 2.29*  ?CALCIUM 7.6* 7.7* 7.5* 7.6* 7.6*  ?MG 2.3 2.2 2.1 2.0 1.8  ?PHOS 4.3 4.3 3.1 3.6 2.6  ? ?GFR: ?Estimated Creatinine Clearance: 17.4 mL/min (A) (by C-G formula based on SCr of 2.29 mg/dL (H)). ?Liver Function Tests: ?Recent Labs  ?Lab 03/29/21 ?2440 03/30/21 ?1027 03/31/21 ?2536 04/01/21 ?6440  04/02/21 ?0525  ?AST 143* 211* 183* 75* 49*  ?ALT 176* 204* 214* 153* 114*  ?ALKPHOS 141* 138* 124 105 91  ?BILITOT 0.5 0.1* 0.2* 0.5 0.2*  ?PROT 5.0* 5.4* 5.3* 5.3* 5.4*  ?ALBUMIN 1.7* 1.8* 1.8* 1.9* 2.0*  ? ?Recent Labs  ?Lab 03/27/21 ?0242  ?LIPASE 41  ? ?No results for input(s): AMMONIA in the last 168 hours. ?Coagulation Profile: ?No results for input(s): INR, PROTIME in the last 168 hours. ?Cardiac Enzymes: ?No results for input(s): CKTOTAL, CKMB, CKMBINDEX, TROPONINI in the last 168 hours. ?BNP (last 3 results) ?No results for input(s): PROBNP in the last 8760 hours. ?HbA1C: ?No results for input(s): HGBA1C in the last 72 hours. ?CBG: ?Recent Labs  ?Lab 04/01/21 ?1644 04/01/21 ?2257 04/02/21 ?0342 04/02/21 ?0744 04/02/21 ?1134  ?GLUCAP 79 99 96 98 105*  ? ?Lipid Profile: ?No results for input(s): CHOL, HDL, LDLCALC, TRIG, CHOLHDL, LDLDIRECT in the last 72 hours. ?Thyroid Function Tests: ?No results for input(s): TSH, T4TOTAL, FREET4, T3FREE, THYROIDAB in the last 72 hours. ?Anemia Panel: ?No results for input(s): VITAMINB12, FOLATE, FERRITIN, TIBC, IRON, RETICCTPCT in the last 72 hours. ?Sepsis Labs: ?Recent Labs  ?Lab 03/27/21 ?0242 03/27/21 ?1144 03/28/21 ?0343  ?PROCALCITON 1.13  --  46.31   ?LATICACIDVEN  --  2.1* 1.9  ? ? ?Recent Results (from the past 240 hour(s))  ?Culture, blood (routine x 2)     Status: None  ? Collection Time: 03/25/21  7:42 AM  ? Specimen: BLOOD  ?Result Value Ref Range Sta

## 2021-04-02 NOTE — TOC Progression Note (Signed)
Transition of Care (TOC) - Progression Note  ? ? ?Patient Details  ?Name: DEJOUR VOS ?MRN: [id number] ?Date of Birth: 08-Jan-1925 ? ?Transition of Care (TOC) CM/SW Contact  ?[place], LCSW ?Phone Number: ?04/02/2021, 12:58 PM ? ?Clinical Narrative:    ? ?CSW spoke with pt's daughter. She inquires about HTA coverage of custodial care. CSW contacted [place] with [facility]; he explained a different department arranges nurse aides. He will notify them regarding pt's DC tomorrow and they will contact family regarding aide services. Daughter states they do not need a 3-in1 or a walker. Family will transport pt home.  ? ?Expected Discharge Plan: [place] ?Barriers to Discharge: Continued Medical Work up ? ?Expected Discharge Plan and Services ?Expected Discharge Plan: [place] ?In-house Referral: Clinical Social Work ?  ?  ?Living arrangements for the past 2 months: Apartment ?                ?  ?  ?  ?  ?  ?  ?  ?  ?  ?  ? ? ?Social Determinants of Health (SDOH) Interventions ?  ? ?Readmission Risk Interventions ?   ? View : No data to display.  ?  ?  ?  ? ? ?

## 2021-04-03 ENCOUNTER — Inpatient Hospital Stay (HOSPITAL_COMMUNITY): Payer: PPO

## 2021-04-03 DIAGNOSIS — N179 Acute kidney failure, unspecified: Secondary | ICD-10-CM | POA: Diagnosis not present

## 2021-04-03 DIAGNOSIS — N189 Chronic kidney disease, unspecified: Secondary | ICD-10-CM | POA: Diagnosis not present

## 2021-04-03 LAB — CBC WITH DIFFERENTIAL/PLATELET
Abs Immature Granulocytes: 0.16 10*3/uL — ABNORMAL HIGH (ref 0.00–0.07)
Basophils Absolute: 0 10*3/uL (ref 0.0–0.1)
Basophils Relative: 0 %
Eosinophils Absolute: 0.1 10*3/uL (ref 0.0–0.5)
Eosinophils Relative: 1 %
HCT: 24.8 % — ABNORMAL LOW (ref 39.0–52.0)
Hemoglobin: 7.8 g/dL — ABNORMAL LOW (ref 13.0–17.0)
Immature Granulocytes: 2 %
Lymphocytes Relative: 16 %
Lymphs Abs: 1.7 10*3/uL (ref 0.7–4.0)
MCH: 27.8 pg (ref 26.0–34.0)
MCHC: 31.5 g/dL (ref 30.0–36.0)
MCV: 88.3 fL (ref 80.0–100.0)
Monocytes Absolute: 0.8 10*3/uL (ref 0.1–1.0)
Monocytes Relative: 8 %
Neutro Abs: 8.1 10*3/uL — ABNORMAL HIGH (ref 1.7–7.7)
Neutrophils Relative %: 73 %
Platelets: 298 10*3/uL (ref 150–400)
RBC: 2.81 MIL/uL — ABNORMAL LOW (ref 4.22–5.81)
RDW: 19.1 % — ABNORMAL HIGH (ref 11.5–15.5)
WBC: 11 10*3/uL — ABNORMAL HIGH (ref 4.0–10.5)
nRBC: 0 % (ref 0.0–0.2)

## 2021-04-03 LAB — GLUCOSE, CAPILLARY
Glucose-Capillary: 90 mg/dL (ref 70–99)
Glucose-Capillary: 128 mg/dL — ABNORMAL HIGH (ref 70–99)
Glucose-Capillary: 89 mg/dL (ref 70–99)
Glucose-Capillary: 102 mg/dL — ABNORMAL HIGH (ref 70–99)

## 2021-04-03 LAB — MAGNESIUM: Magnesium: 1.8 mg/dL (ref 1.7–2.4)

## 2021-04-03 LAB — COMPREHENSIVE METABOLIC PANEL
ALT: 86 U/L — ABNORMAL HIGH (ref 0–44)
AST: 37 U/L (ref 15–41)
Albumin: 1.9 g/dL — ABNORMAL LOW (ref 3.5–5.0)
Alkaline Phosphatase: 80 U/L (ref 38–126)
Anion gap: 7 (ref 5–15)
BUN: 22 mg/dL (ref 8–23)
CO2: 22 mmol/L (ref 22–32)
Calcium: 7.4 mg/dL — ABNORMAL LOW (ref 8.9–10.3)
Chloride: 109 mmol/L (ref 98–111)
Creatinine, Ser: 2.34 mg/dL — ABNORMAL HIGH (ref 0.61–1.24)
GFR, Estimated: 25 mL/min — ABNORMAL LOW (ref >60)
Glucose, Bld: 95 mg/dL (ref 70–99)
Potassium: 3.6 mmol/L (ref 3.5–5.1)
Sodium: 138 mmol/L (ref 135–145)
Total Bilirubin: 0.2 mg/dL — ABNORMAL LOW (ref 0.3–1.2)
Total Protein: 5.2 g/dL — ABNORMAL LOW (ref 6.5–8.1)

## 2021-04-03 LAB — PREPARE RBC (CROSSMATCH)

## 2021-04-03 LAB — PHOSPHORUS: Phosphorus: 3.4 mg/dL (ref 2.5–4.6)

## 2021-04-03 MED ORDER — DOXYCYCLINE HYCLATE 100 MG PO TABS: 100.0000 mg | ORAL_TABLET | Freq: Two times a day (BID) | ORAL | Status: DC

## 2021-04-03 MED ORDER — PIPERACILLIN-TAZOBACTAM IN DEX 2-0.25 GM/50ML IV SOLN
2.2500 g | Freq: Four times a day (QID) | INTRAVENOUS | Status: DC
  Administered 2021-04-03 – 2021-04-05 (×8): 2.25 g via INTRAVENOUS
  Filled 2021-04-03 (×9): qty 50

## 2021-04-03 MED ORDER — SODIUM CHLORIDE 0.9% IV SOLUTION: Freq: Once | INTRAVENOUS | Status: AC

## 2021-04-03 MED ORDER — AMLODIPINE BESYLATE 5 MG PO TABS
2.5000 mg | ORAL_TABLET | Freq: Two times a day (BID) | ORAL | Status: DC
  Administered 2021-04-03 – 2021-04-06 (×7): 2.5 mg via ORAL
  Filled 2021-04-03 (×8): qty 1

## 2021-04-03 MED ORDER — PIPERACILLIN-TAZOBACTAM 3.375 G IVPB: 3.3750 g | Freq: Three times a day (TID) | INTRAVENOUS | Status: DC

## 2021-04-03 NOTE — Progress Notes (Signed)
Occupational Therapy Treatment ?Patient Details ?Name: Jesus Kim ?MRN: [id number] ?DOB: [birth year] ?Today's Date: 04/03/2021 ? ? ?History of present illness patient is a 88 year old male who presented to the hosptial after recommendation from PCP with abnormal lab work. patient was found to have AKI, severe urosepsis, positive proteus mirabilis, GI bleed with melena, acute blood loss anemia,  patient has 1 unit PRBC transfusion on 3/18. PMH: colon cancer, CKD,hyperlipidemia, melanoma, arthritis ?  ?OT comments ? Patient progressing well towards goals. He is min guard with RW to ambulate to bathroom, min assist to power up from toilet and supervision to transfer back in to bed. Family's plan is to take patient home. Recommend [place] services.  ? ?Recommendations for follow up therapy are one component of a multi-disciplinary discharge planning process, led by the attending physician.  Recommendations may be updated based on patient status, additional functional criteria and insurance authorization. ?   ?Follow Up Recommendations ? Home health OT  ?  ?Assistance Recommended at Discharge Frequent or constant Supervision/Assistance  ?Patient can return home with the following ? A little help with walking and/or transfers;A lot of help with bathing/dressing/bathroom ?  ?Equipment Recommendations ? BSC/3in1  ?  ?Recommendations for Other Services   ? ?  ?Precautions / Restrictions Precautions ?Precautions: Fall ?Precaution Comments: monitor HR ?Restrictions ?Weight Bearing Restrictions: No  ? ? ?  ? ?Mobility Bed Mobility ?Overal bed mobility: Needs Assistance ?Bed Mobility: Sit to Supine ?  ?  ?  ?Sit to supine: Supervision, HOB elevated ?  ?  ?  ? ?Transfers ?Overall transfer level: Needs assistance ?Equipment used: Rolling walker (2 wheels) ?Transfers: Sit to/from Stand ?  ?  ?  ?  ?  ?  ?General transfer comment: Min guard wtih RW in room. ?  ?  ?Balance Overall balance assessment: Mild deficits observed, not formally  tested ?  ?  ?  ?  ?  ?  ?  ?  ?  ?  ?  ?  ?  ?  ?  ?  ?  ?  ?   ? ?ADL either performed or assessed with clinical judgement  ? ?ADL Overall ADL's : Needs assistance/impaired ?  ?  ?  ?  ?  ?  ?  ?  ?  ?  ?  ?  ?Toilet Transfer: Minimal assistance;Grab bars;Rolling walker (2 wheels) ?Toilet Transfer Details (indicate cue type and reason): min assist to power up from toilet height ?  ?  ?  ?  ?Functional mobility during ADLs: Minimal assistance;Rolling walker (2 wheels) ?General ADL Comments: Patient min guard to ambualte in room with RW but min assist to power up from toilet. ?  ? ?Extremity/Trunk Assessment Upper Extremity Assessment ?Upper Extremity Assessment: Overall WFL for tasks assessed ?  ?  ?  ?  ?  ? ?Vision Patient Visual Report: No change from baseline ?  ?  ?Perception   ?  ?Praxis   ?  ? ?Cognition Arousal/Alertness: Awake/alert ?Behavior During Therapy: [facility] for tasks assessed/performed ?Overall Cognitive Status: Within Functional Limits for tasks assessed ?  ?  ?  ?  ?  ?  ?  ?  ?  ?  ?  ?  ?  ?  ?  ?  ?  ?  ?  ?   ?Exercises   ? ?  ?Shoulder Instructions   ? ? ?  ?General Comments    ? ? ?Pertinent Vitals/ Pain  Pain Assessment ?Pain Assessment: No/denies pain ? ?Home Living   ?  ?  ?  ?  ?  ?  ?  ?  ?  ?  ?  ?  ?  ?  ?  ?  ?  ?  ? ?  ?Prior Functioning/Environment    ?  ?  ?  ?   ? ?Frequency ? Min 2X/week  ? ? ? ? ?  ?Progress Toward Goals ? ?OT Goals(current goals can now be found in the care plan section) ? Progress towards OT goals: Progressing toward goals ? ?Acute Rehab OT Goals ?Patient Stated Goal: go home ?OT Goal Formulation: With patient ?Time For Goal Achievement: 04/15/21 ?Potential to Achieve Goals: Good  ?Plan Discharge plan remains appropriate   ? ?Co-evaluation ? ? ?   ?  ?  ?OT goals addressed during session: ADL's and self-care ?  ? ?  ?AM-PAC OT "6 Clicks" Daily Activity     ?Outcome Measure ? ? Help from another person eating meals?: A Little ?Help from another person  taking care of personal grooming?: A Little ?Help from another person toileting, which includes using toliet, bedpan, or urinal?: A Little ?Help from another person bathing (including washing, rinsing, drying)?: A Little ?Help from another person to put on and taking off regular upper body clothing?: A Little ?Help from another person to put on and taking off regular lower body clothing?: A Lot ?6 Click Score: 17 ? ?  ?End of Session Equipment Utilized During Treatment: Rolling walker (2 wheels) ? ?OT Visit Diagnosis: Unsteadiness on feet (R26.81);Other abnormalities of gait and mobility (R26.89) ?  ?Activity Tolerance Patient tolerated treatment well ?  ?Patient Left in bed;with call bell/phone within reach;with bed alarm set ?  ?Nurse Communication Mobility status ?  ? ?   ? ?Time: 5945-8592 ?OT Time Calculation (min): 14 min ? ?Charges: OT General Charges ?$OT Visit: 1 Visit ?OT Treatments ?$Self Care/Home Management : 8-22 mins ? ?Mohit Zirbes, OTR/L ?Acute Care Rehab Services  ?Office [phone] ?Pager: [phone]  ? ?Rakeisha Nyce L Keyon Winnick ?04/03/2021, 3:32 PM ?

## 2021-04-03 NOTE — Care Management Important Message (Signed)
Important Message ? ?Patient Details IM Letter placed in Patients room. ?Name: Jesus Kim ?MRN: [id number] ?Date of Birth: 07/18/1924 ? ? ?Medicare Important Message Given:  Yes ? ? ? ? ?Kerin Salen ?04/03/2021, 1:47 PM ?

## 2021-04-03 NOTE — Consult Note (Addendum)
10 Days Post-Op Subjective:  Urology was reconsulted today for scrotal edema and perineal drainage.  He has a history of chronic bilateral hydro and urethral stricture disease.  Dr. Cardell Peach dilated stricture over a wire March 21, 2021 and placed a Foley catheter his creatinine has returned to baseline at 2.34.  White count 11 and no fevers, but patient noted drainage from the posterior scrotum this morning.  He also noted scrotal swelling and redness.  CT scan of the abdomen and pelvis was obtained which reveals stranding in the posterior scrotum and perineum.  No air.  Objective: Vital signs in last 24 hours: Temp:  [97.6 F (36.4 C)-97.8 F (36.6 C)] 97.8 F (36.6 C) (03/23 1158) Pulse Rate:  [90-98] 98 (03/23 1158) Resp:  [18-20] 20 (03/23 1158) BP: (147-153)/(65-84) 149/79 (03/23 1158) SpO2:  [98 %-100 %] 100 % (03/23 1158)  Intake/Output from previous day: 03/22 0701 - 03/23 0700 In: 320 [P.O.:120; IV Piggyback:200] Out: 3700 [Urine:3700] Intake/Output this shift: Total I/O In: -  Out: 700 [Urine:700]  Physical Exam:  He looks well, watching TV.  No acute distress.  He is alert and oriented. GU: Mild penile edema, 14 French Foley catheter in place.  Urine clear.  Scrotum with some edema and erythema more inferiorly.  With scrotal elevation the posterior scrotum and perineum has fluctuance with drainage of purulent and "dishwater" fluid.  This is draining into a pad.  No crepitus.  Lab Results: Recent Labs    04/01/21 0326 04/02/21 0338 04/03/21 0329  HGB 8.1* 8.8* 7.8*  HCT 25.7* 27.7* 24.8*   BMET Recent Labs    04/02/21 0338 04/03/21 0329  NA 136 138  K 3.7 3.6  CL 107 109  CO2 21* 22  GLUCOSE 108* 95  BUN 29* 22  CREATININE 2.29* 2.34*  CALCIUM 7.6* 7.4*   No results for input(s): LABPT, INR in the last 72 hours. No results for input(s): LABURIN in the last 72 hours. Results for orders placed or performed during the hospital encounter of 03/19/21  Resp Panel  by RT-PCR (Flu A&B, Covid) Nasopharyngeal Swab     Status: None   Collection Time: 03/19/21  6:30 PM   Specimen: Nasopharyngeal Swab; Nasopharyngeal(NP) swabs in vial transport medium  Result Value Ref Range Status   SARS Coronavirus 2 by RT PCR NEGATIVE NEGATIVE Final    Comment: (NOTE) SARS-CoV-2 target nucleic acids are NOT DETECTED.  The SARS-CoV-2 RNA is generally detectable in upper respiratory specimens during the acute phase of infection. The lowest concentration of SARS-CoV-2 viral copies this assay can detect is 138 copies/mL. A negative result does not preclude SARS-Cov-2 infection and should not be used as the sole basis for treatment or other patient management decisions. A negative result may occur with  improper specimen collection/handling, submission of specimen other than nasopharyngeal swab, presence of viral mutation(s) within the areas targeted by this assay, and inadequate number of viral copies(<138 copies/mL). A negative result must be combined with clinical observations, patient history, and epidemiological information. The expected result is Negative.  Fact Sheet for Patients:  BloggerCourse.com  Fact Sheet for Healthcare Providers:  SeriousBroker.it  This test is no t yet approved or cleared by the [place] FDA and  has been authorized for detection and/or diagnosis of SARS-CoV-2 by FDA under an Emergency Use Authorization (EUA). This EUA will remain  in effect (meaning this test can be used) for the duration of the COVID-19 declaration under Section 564(b)(1) of the Act, 21  U.S.C.section 360bbb-3(b)(1), unless the authorization is terminated  or revoked sooner.       Influenza A by PCR NEGATIVE NEGATIVE Final   Influenza B by PCR NEGATIVE NEGATIVE Final    Comment: (NOTE) The Xpert Xpress SARS-CoV-2/FLU/RSV plus assay is intended as an aid in the diagnosis of influenza from Nasopharyngeal swab  specimens and should not be used as a sole basis for treatment. Nasal washings and aspirates are unacceptable for Xpert Xpress SARS-CoV-2/FLU/RSV testing.  Fact Sheet for Patients: BloggerCourse.com  Fact Sheet for Healthcare Providers: SeriousBroker.it  This test is not yet approved or cleared by the [place] FDA and has been authorized for detection and/or diagnosis of SARS-CoV-2 by FDA under an Emergency Use Authorization (EUA). This EUA will remain in effect (meaning this test can be used) for the duration of the COVID-19 declaration under Section 564(b)(1) of the Act, 21 U.S.C. section 360bbb-3(b)(1), unless the authorization is terminated or revoked.  Performed at [facility], [address], [place], [place] [address]   MRSA Next Gen by PCR, Nasal     Status: None   Collection Time: 03/23/21  9:37 AM   Specimen: Nasal Mucosa; Nasal Swab  Result Value Ref Range Status   MRSA by PCR Next Gen NOT DETECTED NOT DETECTED Final    Comment: (NOTE) The GeneXpert MRSA Assay (FDA approved for NASAL specimens only), is one component of a comprehensive MRSA colonization surveillance program. It is not intended to diagnose MRSA infection nor to guide or monitor treatment for MRSA infections. Test performance is not FDA approved in patients less than 42 years old. Performed at [facility], [address] [place], [place] [address]   Culture, blood (routine x 2)     Status: None   Collection Time: 03/25/21  7:42 AM   Specimen: BLOOD  Result Value Ref Range Status   Specimen Description   Final    BLOOD RIGHT ANTECUBITAL Performed at [facility], [address] [place], [place] [address]    Special Requests   Final    BOTTLES DRAWN AEROBIC AND ANAEROBIC Blood Culture adequate volume Performed at [facility], [address], [place], [place]  [address]    Culture   Final    NO GROWTH 5 DAYS Performed at [facility], [address], [place], [place] [address]    Report Status 03/30/2021 FINAL  Final  Culture, blood (routine x 2)     Status: None   Collection Time: 03/25/21  7:45 AM   Specimen: BLOOD  Result Value Ref Range Status   Specimen Description   Final    BLOOD BLOOD LEFT HAND Performed at [facility], [address], [place], [place] [address]    Special Requests   Final    BOTTLES DRAWN AEROBIC AND ANAEROBIC Blood Culture adequate volume Performed at [facility], [address], [place], [place] [address]    Culture   Final    NO GROWTH 5 DAYS Performed at [facility], [address] [place], [place] [address]    Report Status 03/30/2021 FINAL  Final  Urine Culture     Status: Abnormal   Collection Time: 03/25/21  8:51 AM   Specimen: Urine, Catheterized  Result Value Ref Range Status   Specimen Description   Final    URINE, CATHETERIZED Performed at [facility], [address] [place], [place] [address]  Special Requests   Final    NONE Performed at [facility], [address], [place], [place] [address]    Culture >=100,000 COLONIES/mL PROTEUS MIRABILIS (A)  Final   Report Status 03/27/2021 FINAL  Final   Organism ID, Bacteria PROTEUS MIRABILIS (A)  Final      Susceptibility   Proteus mirabilis - MIC*    AMPICILLIN <=2 SENSITIVE Sensitive     CEFAZOLIN <=4 SENSITIVE Sensitive     CEFEPIME <=0.12 SENSITIVE Sensitive     CEFTRIAXONE <=0.25 SENSITIVE Sensitive     CIPROFLOXACIN <=0.25 SENSITIVE Sensitive     GENTAMICIN <=1 SENSITIVE Sensitive     IMIPENEM 2 SENSITIVE Sensitive     NITROFURANTOIN 256 RESISTANT Resistant     TRIMETH/SULFA <=20 SENSITIVE Sensitive     AMPICILLIN/SULBACTAM <=2 SENSITIVE Sensitive     PIP/TAZO <=4 SENSITIVE Sensitive     * >=100,000 COLONIES/mL PROTEUS MIRABILIS  Culture,  blood (routine x 2)     Status: None   Collection Time: 03/27/21 11:44 AM   Specimen: BLOOD RIGHT HAND  Result Value Ref Range Status   Specimen Description   Final    BLOOD RIGHT HAND Performed at [facility], [address] [place], [place] [address]    Special Requests   Final    BOTTLES DRAWN AEROBIC AND ANAEROBIC Blood Culture adequate volume Performed at [facility], [address], [place], [place] [address]    Culture   Final    NO GROWTH 5 DAYS Performed at [facility], [address], [place], [place] [address]    Report Status 04/01/2021 FINAL  Final  Culture, blood (routine x 2)     Status: None   Collection Time: 03/27/21 11:54 AM   Specimen: BLOOD LEFT HAND  Result Value Ref Range Status   Specimen Description   Final    BLOOD LEFT HAND Performed at [facility], [address], [place], [place] [address]    Special Requests   Final    BOTTLES DRAWN AEROBIC AND ANAEROBIC Blood Culture adequate volume Performed at [facility], [address] [place], [place] [address]    Culture   Final    NO GROWTH 5 DAYS Performed at [facility], [address], [place], [place] [address]    Report Status 04/01/2021 FINAL  Final  Aerobic Culture w Gram Stain (superficial specimen)     Status: None (Preliminary result)   Collection Time: 04/03/21  4:50 AM   Specimen: Scrotum  Result Value Ref Range Status   Specimen Description   Final    SCROTUM Performed at [facility], [address] [place], [place] [address]    Special Requests   Final    NONE Performed at [facility], [address], [place], [place] [address]    Gram Stain   Final    MODERATE WBC PRESENT, PREDOMINANTLY PMN RARE GRAM NEGATIVE RODS Performed at [facility], [address], [place], [place] [address]    Culture PENDING  Incomplete   Report Status PENDING   Incomplete    Studies/Results: CT PELVIS WO CONTRAST  Result Date: 04/03/2021 CLINICAL DATA:  Scrotal swelling and drainage. EXAM: CT PELVIS WITHOUT CONTRAST TECHNIQUE: Multidetector CT imaging of the pelvis was performed following the standard protocol without intravenous contrast. RADIATION DOSE REDUCTION: This exam was performed according to the departmental dose-optimization program which includes automated exposure  control, adjustment of the mA and/or kV according to patient size and/or use of iterative reconstruction technique. COMPARISON:  July 30, 2020. FINDINGS: Urinary Tract: No ureteral dilatation or calculus is noted. Urinary bladder is decompressed secondary to Foley catheter. Severe urinary bladder wall thickening is noted with small rounded foci of gas within the bladder wall, most likely representing air within bladder diverticula. Bowel:  Unremarkable visualized pelvic bowel loops. Vascular/Lymphatic: Atherosclerosis of abdominal aorta is noted. No adenopathy is noted. Reproductive: Mild prostatic enlargement is noted. There is moderate to severe inflammatory stranding involving the subcutaneous tissues of the scrotum and perineum concerning for cellulitis. Hydroceles may be present. No gas formation is seen at this time. Other: Small fat containing periumbilical hernia is noted. No ascites is noted. Musculoskeletal: No suspicious bone lesions identified. IMPRESSION: Moderate to severe inflammatory stranding is seen involving the subcutaneous tissues of the scrotum and perineum concerning for cellulitis. Bilateral hydroceles may be present. Urinary bladder is decompressed secondary to Foley catheter. Severe urinary bladder wall thickening is noted most consistent with cystitis. Small fat containing periumbilical hernia. Aortic Atherosclerosis (ICD10-I70.0). Electronically Signed   By: Lupita Raider M.D.   On: 04/03/2021 13:59    Assessment/Plan:  1) perineal abscess-concern for abscess  and "watering pot perineum".  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  Fluid and purulence spontaneously draining, but still some fluctuance on exam with drainage of fluid with palpation.   2) urethral stricture disease-has a 14 French Foley catheter after urethral dilation over a wire 03/21/2021.  Urine culture grew Proteus.  3) chronic bilateral hydro-creatinine has improved to baseline.  I discussed the CT and exam findings with the patient.  I recommended cystoscopy with urethral stricture dilation, replacement of Foley catheter with slight upsize, exam under anesthesia, incision and drainage of the scrotum/perineum with washout of the tissues and packing and we went over the nature risk benefits and alternatives to the procedure.  We will make n.p.o. after midnight and add onto the OR.  We will check on patient in the morning.  Dr. Alvino Chapel to give 1 unit of blood and broaden abx. I discussed the plan with her.  I think it is reasonable to plan to do this tomorrow when he is n.p.o., his anemia has been addressed, the collection is spontaneously draining, and he has no white count or fever.  I discussed with Dr. Alvester Morin who is on call tomorrow and one of [place] will proceed with the procedure.    LOS: 15 days   Jerilee Field 04/03/2021, 4:53 PM

## 2021-04-03 NOTE — Progress Notes (Addendum)
?PROGRESS NOTE ? ? ? ?Jesus Kim  GLO:[id number] DOB: Jun 14, 1924 DOA: 03/19/2021 ?PCP: [name], MD  ? ?  ?Brief Narrative:  ?Jesus Kim is a 88 year old male with past medical history significant for CKD stage IV, hypertension, osteoarthritis, hyperlipidemia who presented to the hospital after being called by his PCP to present due to abnormal lab work.  He complained of decreased p.o. intake for the past couple of days and not feeling well.  Lab work revealed a creatinine of 4.58 which is increased from his baseline of 2.4-2.5.  Urology was consulted due to patient's history of ureteral stricture with chronic bladder outlet obstruction and chronic bilateral hydronephrosis.  Foley was placed.  Patient also met criteria for severe sepsis on admission and was treated with antibiotics.  Hospitalization further complicated by respiratory failure requiring intubation on 3/16.  He was extubated 3/17.  He also briefly required vasopressor support which was weaned on 3/13.  He had acute blood loss anemia and required 3 unit packed red blood cell on 3/12, 1 unit packed red blood cell 3/18.  He underwent EGD 3/13 which showed multiple ulcers without active bleed.  He also developed shock liver. ? ?New events last 24 hours / Subjective: ?Early this morning, he noticed draining from his scrotum.  He admits to significant swelling of his scrotum, no pain at rest, but is tender to palpation.  Otherwise feeling well and no respiratory complaints or nausea, vomiting.  Some dried blood around head of penis at the site of Foley insertion.  Foley is draining well, documented 3700 mL urine output ? ?Assessment & Plan: ?  ?Principal Problem: ?  Acute kidney injury superimposed on CKD ([place]) ?Active Problems: ?  Hyperkalemia ?  Essential hypertension ?  Chronic hyponatremia ?  Acute blood loss anemia ?  Hemorrhagic shock ([place]) ?  Acute hypoxemic respiratory failure ([place]) ?  Shock liver ?  Elevated liver enzymes ?  Rectal pain ?   Hemorrhoids ? ? ?AKI on CKD 4 ?-With bilateral chronic hydronephrosis and pan urethral stricture  ?-Baseline creatinine 2.1-2.6 ?-Urology consulted, Dr. Abner Greenspan  ?-Continue proscar  ?-Foley placement until outpatient follow-up with urology ? ?Scrotal cellulitis ?-CT pelvis revealed moderate to severe inflammatory stranding involving the subcutaneous tissue of the scrotum and perineum concerning for cellulitis ?-Urology consulted, discussed with Dr. Junious Silk  ?-Switched to zosyn to cover proteus UTI as well as scrotal cellulitis  ? ?Severe sepsis secondary to Proteus UTI ?-Sepsis not present on admission, met sepsis criteria 3/15 ?-Urine culture 3/14 revealed Proteus mirabilis ?-Blood cultures remain negative ?-Zosyn 3/17-3/21, Unasyn 3/21-3/23 (7 days), back to Zosyn 3/23 >>  ?-Leukocytosis improved ? ?Hemorrhagic shock in setting of GI bleed, acute blood loss anemia ?-Status post 3 unit prbc 3/12, 1 unit prbc 3/18  ?-Status post EGD 3/13 which revealed multiple ulcers without active bleed ?-GI signed off 3/20 ?-PPI  ? ?Hypertension ?-Has been off blood pressure medications due to hemorrhagic shock.  Now blood pressure is elevated.  Resume Norvasc ? ?Acute hypoxemic respiratory failure ?-Required intubation 3/16, extubated 3/17 ?-Now on room air ? ?Shock liver ?-LFTs improving ? ?HLD ?-Zocor  ? ?DVT prophylaxis:  ?Place and maintain sequential compression device Start: 03/24/21 0816 ? ?Code Status: Full ?Family Communication: No family at bedside ?Disposition Plan:  ?Status is: Inpatient ?Remains inpatient appropriate because: Awaiting CT pelvis ? ? ?Antimicrobials:  ?Anti-infectives (From admission, onward)  ? ? Start     Dose/Rate Route Frequency Ordered Stop  ? 04/01/21 1200  Ampicillin-Sulbactam (UNASYN) 3 g in sodium chloride 0.9 % 100 mL IVPB       ? 3 g ?200 mL/hr over 30 Minutes Intravenous Every 12 hours 04/01/21 1128    ? 03/28/21 2000  piperacillin-tazobactam (ZOSYN) IVPB 2.25 g  Status:  Discontinued       ?  2.25 g ?100 mL/hr over 30 Minutes Intravenous Every 8 hours 03/28/21 1503 04/01/21 1128  ? 03/28/21 1200  piperacillin-tazobactam (ZOSYN) IVPB 3.375 g  Status:  Discontinued       ? 3.375 g ?12.5 mL/hr over 240 Minutes Intravenous Every 8 hours 03/28/21 1031 03/28/21 1503  ? 03/26/21 1000  cefTRIAXone (ROCEPHIN) 2 g in sodium chloride 0.9 % 100 mL IVPB  Status:  Discontinued       ? 2 g ?200 mL/hr over 30 Minutes Intravenous Every 24 hours 03/25/21 1324 03/28/21 1031  ? 03/25/21 1400  cefTRIAXone (ROCEPHIN) 1 g in sodium chloride 0.9 % 100 mL IVPB       ? 1 g ?200 mL/hr over 30 Minutes Intravenous  Once 03/25/21 1324 03/25/21 1409  ? 03/25/21 0830  cefTRIAXone (ROCEPHIN) 1 g in sodium chloride 0.9 % 100 mL IVPB  Status:  Discontinued       ? 1 g ?200 mL/hr over 30 Minutes Intravenous Every 24 hours 03/25/21 0734 03/25/21 1329  ? ?  ? ? ? ?Objective: ?Vitals:  ? 04/02/21 0545 04/02/21 1137 04/02/21 2131 04/03/21 0446  ?BP: (!) 146/77 (!) 154/79 (!) 147/65 (!) 153/84  ?Pulse: 91 (!) 104 90 96  ?Resp: _0 ?Temp: 97.8 ?F (36.6 ?C) 98.8 ?F (37.1 ?C) 97.6 ?F (36.4 ?C) 97.8 ?F (36.6 ?C)  ?TempSrc: Oral Oral Oral Oral  ?SpO2: 100% 99% 98% 98%  ?Weight:      ?Height:      ? ? ?Intake/Output Summary (Last 24 hours) at 04/03/2021 1033 ?Last data filed at 04/03/2021 0500 ?Gross per 24 hour  ?Intake 320 ml  ?Output 3150 ml  ?Net -2830 ml  ? ? ?Filed Weights  ? 03/19/21 1648 03/28/21 1152  ?Weight: 70.3 kg 68 kg  ? ? ?Examination:  ?General exam: Appears calm and comfortable  ?Respiratory system: Clear to auscultation. Respiratory effort normal. No respiratory distress. No conversational dyspnea.  On room air ?Cardiovascular system: S1 & S2 heard, RRR. No murmurs. No pedal edema. ?Gastrointestinal system: Abdomen is nondistended, soft and nontender. Normal bowel sounds heard. ?Central nervous system: Alert and oriented. No focal neurological deficits. Speech clear.  ?Extremities: Symmetric in appearance  ?GU: Erythema and  swelling of the scrotum, Foley catheter is intact, serous drainage from base of scrotum ?Psychiatry: Judgement and insight appear normal. Mood & affect appropriate.  ? ?Data Reviewed: I have personally reviewed following labs and imaging studies ? ?CBC: ?Recent Labs  ?Lab 03/30/21 ?0339 03/31/21 ?4315 04/01/21 ?4008 04/02/21 ?6761 04/03/21 ?9509  ?WBC 34.2* 16.4* 13.9* 14.5* 11.0*  ?NEUTROABS 33.9* 13.7* 11.7* 10.8* 8.1*  ?HGB 8.1* 8.1* 8.1* 8.8* 7.8*  ?HCT 25.2* 25.7* 25.7* 27.7* 24.8*  ?MCV 87.8 88.9 88.9 88.5 88.3  ?PLT 317 315 304 340 298  ? ? ?Basic Metabolic Panel: ?Recent Labs  ?Lab 03/30/21 ?0339 03/31/21 ?3267 04/01/21 ?1245 04/02/21 ?8099 04/03/21 ?8338  ?NA 135 135 138 136 138  ?K 4.4 4.0 4.2 3.7 3.6  ?CL 108 109 109 107 109  ?CO2 18* 19* 21* 21* 22  ?GLUCOSE 122* 85 113* 108* 95  ?BUN 48* 43* 37* 29* 22  ?CREATININE  2.69* 2.61* 2.57* 2.29* 2.34*  ?CALCIUM 7.7* 7.5* 7.6* 7.6* 7.4*  ?MG 2.2 2.1 2.0 1.8 1.8  ?PHOS 4.3 3.1 3.6 2.6 3.4  ? ? ?GFR: ?Estimated Creatinine Clearance: 17 mL/min (A) (by C-G formula based on SCr of 2.34 mg/dL (H)). ?Liver Function Tests: ?Recent Labs  ?Lab 03/30/21 ?0339 03/31/21 ?1675 04/01/21 ?6125 04/02/21 ?4832 04/03/21 ?3468  ?AST 211* 183* 75* 49* 37  ?ALT 204* 214* 153* 114* 86*  ?ALKPHOS 138* 124 105 91 80  ?BILITOT 0.1* 0.2* 0.5 0.2* 0.2*  ?PROT 5.4* 5.3* 5.3* 5.4* 5.2*  ?ALBUMIN 1.8* 1.8* 1.9* 2.0* 1.9*  ? ? ?No results for input(s): LIPASE, AMYLASE in the last 168 hours. ? ?No results for input(s): AMMONIA in the last 168 hours. ?Coagulation Profile: ?No results for input(s): INR, PROTIME in the last 168 hours. ?Cardiac Enzymes: ?No results for input(s): CKTOTAL, CKMB, CKMBINDEX, TROPONINI in the last 168 hours. ?BNP (last 3 results) ?No results for input(s): PROBNP in the last 8760 hours. ?HbA1C: ?No results for input(s): HGBA1C in the last 72 hours. ?CBG: ?Recent Labs  ?Lab 04/02/21 ?8737 04/02/21 ?1134 04/02/21 ?1710 04/02/21 ?2128 04/03/21 ?0734  ?GLUCAP 98 105* 104*  128* 90  ? ? ?Lipid Profile: ?No results for input(s): CHOL, HDL, LDLCALC, TRIG, CHOLHDL, LDLDIRECT in the last 72 hours. ?Thyroid Function Tests: ?No results for input(s): TSH, T4TOTAL, FREET4, T3FREE,

## 2021-04-03 NOTE — Progress Notes (Signed)
~  0408 04/03/2021 pt called RN to room, He stated "it appears that I am wet down here" and he pulled down the blanket.  RN could clearly see fluid on bed pad, sheet, blanket and gown.  RN pulled back the patients gown and could see a moderate amount of purulent, brown-yellow drainage.  RN had patient roll slightly to the and RN was able to visualize that the drainage was coming form the base of the scrotum.  Of note RN has had this patient for the past 2 night shifts and pt has had a noticeably swollen, and reddened scrotum the first night as was reported by his day time RN. (~0203 04/02/2021 pt called out to have BM, it was at this time that RN noticed a little more swelling with the penis but assumed it was from some trauma as the foley was not connected to the stat lok and there was a little bit of bloody discharge coming out from around the foley.)  RN immediately sent a secure chat to on call floor coverage Clarene Essex NP to come take a look at this patients scrotum and the drainage.  He verbally told RN to get a sample in a specimen cup if reasonably able to do so with out discomfort or distress to the patient.  RN collected a sample and sent it down per the orders that were written.   ? ?Pt washed up, all linens changed, put mesh underwear with dry gauze and ABD pad to help absorb the drainage.  RN informed the patient about what was going on and that he may not be discharging home today as was initially planned. Rn explained that next step was for RN to send down the fluid sample and his primary team would decide what to do when the see him in the morning.  RN asked if he needed something for pain of which he declined stating he was only a little tender down there and didn't need to take anything.  ?

## 2021-04-03 NOTE — Progress Notes (Signed)
Physical Therapy Treatment ?Patient Details ?Name: Jesus Kim ?MRN: [id number] ?DOB: [birth year] ?Today's Date: 04/03/2021 ? ? ?History of Present Illness patient is a 88 year old male who presented to the hosptial after recommendation from PCP with abnormal lab work. patient was found to have AKI, severe urosepsis, positive proteus mirabilis, GI bleed with melena, acute blood loss anemia,  patient has 1 unit PRBC transfusion on 3/18. PMH: colon cancer, CKD,hyperlipidemia, melanoma, arthritis ? ?  ?PT Comments  ? ? Pt OOB in recliner sleeping but easily aroused.  AxO x 1 pleasant with intermittent bouts of confusion to current situation.  Following repeat VC's to stay on task as he tends to drift.  General transfer comment: 50% VC's on proper hand placement also assisted with a toilet tranfer.  Required VC's for safety and peri care.  Static standing balance is fair.  Required asisst to steady.General Gait Details: assisted with amb to and from bathroom only due to fatigue.  Unsteady.  Required assist for direction.  Avg HR 109.  Weak.  Deconditioned. Pt dripping a small amount of bloody/serous drops from his scrotum on floor as he amb to the bathroom.  Assisted with peri care and new pad placed.   ?Pt looks better but remains profoundly weak.   ?Recommendations for follow up therapy are one component of a multi-disciplinary discharge planning process, led by the attending physician.  Recommendations may be updated based on patient status, additional functional criteria and insurance authorization. ? ?Follow Up Recommendations ? Home health PT ?  ?  ?Assistance Recommended at Discharge Frequent or constant Supervision/Assistance  ?Patient can return home with the following A little help with walking and/or transfers;A little help with bathing/dressing/bathroom;Assistance with cooking/housework;Assist for transportation;Help with stairs or ramp for entrance ?  ?Equipment Recommendations ? Rolling walker (2  wheels);BSC/3in1  ?  ?Recommendations for Other Services   ? ? ?  ?Precautions / Restrictions Precautions ?Precautions: Fall ?Precaution Comments: monitor HR ?Restrictions ?Weight Bearing Restrictions: No  ?  ? ?Mobility ? Bed Mobility ?  ?  ?  ?  ?  ?  ?  ?General bed mobility comments: OOB in recliner ?  ? ?Transfers ?Overall transfer level: Needs assistance ?Equipment used: Rolling walker (2 wheels) ?Transfers: Sit to/from Stand ?Sit to Stand: Min assist ?  ?  ?  ?  ?  ? ?  ? ?Ambulation/Gait ?Ambulation/Gait assistance: Min assist, +2 safety/equipment ?Gait Distance (Feet): 26 Feet ?Assistive device: Rolling walker (2 wheels) ?Gait Pattern/deviations: Step-through pattern, Decreased stride length ?Gait velocity: decreased ?  ?  ?General Gait Details: assisted with amb to and from bathroom only due to fatigue.  Unsteady.  Required assist for direction.  Avg HR 109.  Weak.  Deconditioned. ? ? ?Stairs ?  ?  ?  ?  ?  ? ? ?Wheelchair Mobility ?  ? ?Modified Rankin (Stroke Patients Only) ?  ? ? ?  ?Balance   ?  ?  ?  ?  ?  ?  ?  ?  ?  ?  ?  ?  ?  ?  ?  ?  ?  ?  ?  ? ?  ?Cognition Arousal/Alertness: Awake/alert ?Behavior During Therapy: [facility] for tasks assessed/performed ?Overall Cognitive Status: Within Functional Limits for tasks assessed ?Area of Impairment: Orientation ?  ?  ?  ?  ?  ?  ?  ?  ?  ?  ?  ?  ?  ?  ?  ?General Comments: AxO  pleasant but intermittent confusion and requiring repeat instruction. ?  ?  ? ?  ?Exercises   ? ?  ?General Comments   ?  ?  ? ?Pertinent Vitals/Pain Pain Assessment ?Pain Assessment: Faces ?Faces Pain Scale: Hurts little more ?Pain Location: scrotum (red/edema) dripping red/serous on floor as he amb to bathroom ?Pain Descriptors / Indicators: Grimacing, Guarding ?Pain Intervention(s): Monitored during session  ? ? ?Home Living   ?  ?  ?  ?  ?  ?  ?  ?  ?  ?   ?  ?Prior Function    ?  ?  ?   ? ?PT Goals (current goals can now be found in the care plan section) Progress towards PT  goals: Progressing toward goals ? ?  ?Frequency ? ? ? Min 3X/week ? ? ? ?  ?PT Plan Current plan remains appropriate  ? ? ?Co-evaluation   ?  ?  ?  ?  ? ?  ?AM-PAC PT "6 Clicks" Mobility   ?Outcome Measure ? Help needed turning from your back to your side while in a flat bed without using bedrails?: A Little ?Help needed moving from lying on your back to sitting on the side of a flat bed without using bedrails?: A Little ?Help needed moving to and from a bed to a chair (including a wheelchair)?: A Little ?Help needed standing up from a chair using your arms (e.g., wheelchair or bedside chair)?: A Little ?Help needed to walk in hospital room?: A Lot ?Help needed climbing 3-5 steps with a railing? : A Lot ?6 Click Score: 16 ? ?  ?End of Session Equipment Utilized During Treatment: Gait belt ?Activity Tolerance: Patient limited by fatigue ?Patient left: in chair;with call bell/phone within reach;with family/visitor present ?Nurse Communication: Mobility status ?PT Visit Diagnosis: Muscle weakness (generalized) (M62.81);Unsteadiness on feet (R26.81);Difficulty in walking, not elsewhere classified (R26.2);Pain ?  ? ? ?Time: 6659-9357 ?PT Time Calculation (min) (ACUTE ONLY): 26 min ? ?Charges:  $Gait Training: 8-22 mins ?$Therapeutic Activity: 8-22 mins          ?          ? ?{Thomas Mabry  PTA ?Acute  Rehabilitation Services ?Pager      [phone] ?Office      [phone] ? ? ?

## 2021-04-04 ENCOUNTER — Inpatient Hospital Stay (HOSPITAL_COMMUNITY): Payer: PPO | Admitting: Anesthesiology

## 2021-04-04 ENCOUNTER — Encounter (HOSPITAL_COMMUNITY)
Admission: EM | Disposition: A | Discharge status: Home Health | Payer: Self-pay | Source: Ambulatory Visit | Attending: Internal Medicine

## 2021-04-04 DIAGNOSIS — I129 Hypertensive chronic kidney disease with stage 1 through stage 4 chronic kidney disease, or unspecified chronic kidney disease: Secondary | ICD-10-CM

## 2021-04-04 DIAGNOSIS — N179 Acute kidney failure, unspecified: Secondary | ICD-10-CM

## 2021-04-04 DIAGNOSIS — L02215 Cutaneous abscess of perineum: Secondary | ICD-10-CM

## 2021-04-04 DIAGNOSIS — Z87891 Personal history of nicotine dependence: Secondary | ICD-10-CM

## 2021-04-04 DIAGNOSIS — N35919 Unspecified urethral stricture, male, unspecified site: Secondary | ICD-10-CM

## 2021-04-04 DIAGNOSIS — N189 Chronic kidney disease, unspecified: Secondary | ICD-10-CM | POA: Diagnosis not present

## 2021-04-04 DIAGNOSIS — N184 Chronic kidney disease, stage 4 (severe): Secondary | ICD-10-CM

## 2021-04-04 HISTORY — PX: INCISION AND DRAINAGE ABSCESS: SHX5864

## 2021-04-04 HISTORY — PX: CYSTOSCOPY WITH URETHRAL DILATATION: SHX5125

## 2021-04-04 LAB — CBC WITH DIFFERENTIAL/PLATELET
Abs Immature Granulocytes: 0.17 10*3/uL — ABNORMAL HIGH (ref 0.00–0.07)
Basophils Absolute: 0 10*3/uL (ref 0.0–0.1)
Basophils Relative: 1 %
Eosinophils Absolute: 0.2 10*3/uL (ref 0.0–0.5)
Eosinophils Relative: 2 %
HCT: 30.3 % — ABNORMAL LOW (ref 39.0–52.0)
Hemoglobin: 9.8 g/dL — ABNORMAL LOW (ref 13.0–17.0)
Immature Granulocytes: 2 %
Lymphocytes Relative: 19 %
Lymphs Abs: 1.6 10*3/uL (ref 0.7–4.0)
MCH: 29 pg (ref 26.0–34.0)
MCHC: 32.3 g/dL (ref 30.0–36.0)
MCV: 89.6 fL (ref 80.0–100.0)
Monocytes Absolute: 0.7 10*3/uL (ref 0.1–1.0)
Monocytes Relative: 8 %
Neutro Abs: 5.9 10*3/uL (ref 1.7–7.7)
Neutrophils Relative %: 68 %
Platelets: 294 10*3/uL (ref 150–400)
RBC: 3.38 MIL/uL — ABNORMAL LOW (ref 4.22–5.81)
RDW: 18.1 % — ABNORMAL HIGH (ref 11.5–15.5)
WBC: 8.7 10*3/uL (ref 4.0–10.5)
nRBC: 0 % (ref 0.0–0.2)

## 2021-04-04 LAB — COMPREHENSIVE METABOLIC PANEL
ALT: 80 U/L — ABNORMAL HIGH (ref 0–44)
AST: 45 U/L — ABNORMAL HIGH (ref 15–41)
Albumin: 2 g/dL — ABNORMAL LOW (ref 3.5–5.0)
Alkaline Phosphatase: 74 U/L (ref 38–126)
Anion gap: 7 (ref 5–15)
BUN: 18 mg/dL (ref 8–23)
CO2: 20 mmol/L — ABNORMAL LOW (ref 22–32)
Calcium: 7.3 mg/dL — ABNORMAL LOW (ref 8.9–10.3)
Chloride: 109 mmol/L (ref 98–111)
Creatinine, Ser: 2.28 mg/dL — ABNORMAL HIGH (ref 0.61–1.24)
GFR, Estimated: 26 mL/min — ABNORMAL LOW (ref >60)
Glucose, Bld: 95 mg/dL (ref 70–99)
Potassium: 3.4 mmol/L — ABNORMAL LOW (ref 3.5–5.1)
Sodium: 136 mmol/L (ref 135–145)
Total Bilirubin: 0.3 mg/dL (ref 0.3–1.2)
Total Protein: 5.4 g/dL — ABNORMAL LOW (ref 6.5–8.1)

## 2021-04-04 LAB — TYPE AND SCREEN
ABO/RH(D): A POS
Antibody Screen: NEGATIVE
Unit division: 0

## 2021-04-04 LAB — BPAM RBC
Blood Product Expiration Date: 202304162359
ISSUE DATE / TIME: 202303231946
Unit Type and Rh: 6200

## 2021-04-04 LAB — GLUCOSE, CAPILLARY
Glucose-Capillary: 99 mg/dL (ref 70–99)
Glucose-Capillary: 87 mg/dL (ref 70–99)
Glucose-Capillary: 153 mg/dL — ABNORMAL HIGH (ref 70–99)

## 2021-04-04 LAB — PHOSPHORUS: Phosphorus: 3.8 mg/dL (ref 2.5–4.6)

## 2021-04-04 LAB — MAGNESIUM: Magnesium: 1.9 mg/dL (ref 1.7–2.4)

## 2021-04-04 SURGERY — CYSTOSCOPY, WITH URETHRAL DILATION
Anesthesia: General | Site: Urethra

## 2021-04-04 MED ORDER — CHLORHEXIDINE GLUCONATE 0.12 % MT SOLN
15.0000 mL | Freq: Once | OROMUCOSAL | Status: AC
  Administered 2021-04-04: 15 mL via OROMUCOSAL

## 2021-04-04 MED ORDER — FENTANYL CITRATE PF 50 MCG/ML IJ SOSY
12.5000 ug | PREFILLED_SYRINGE | INTRAMUSCULAR | Status: DC | PRN
  Filled 2021-04-04: qty 1

## 2021-04-04 MED ORDER — POTASSIUM CHLORIDE CRYS ER 20 MEQ PO TBCR
40.0000 meq | EXTENDED_RELEASE_TABLET | Freq: Once | ORAL | Status: AC
  Administered 2021-04-04: 40 meq via ORAL
  Filled 2021-04-04: qty 2

## 2021-04-04 MED ORDER — STERILE WATER FOR IRRIGATION IR SOLN
Status: DC | PRN
  Administered 2021-04-04: 1000 mL

## 2021-04-04 MED ORDER — SODIUM CHLORIDE 0.9 % IV SOLN: INTRAVENOUS | Status: AC

## 2021-04-04 MED ORDER — ACETAMINOPHEN 10 MG/ML IV SOLN: 1000.0000 mg | Freq: Once | INTRAVENOUS | Status: DC | PRN

## 2021-04-04 MED ORDER — PROPOFOL 10 MG/ML IV BOLUS
INTRAVENOUS | Status: AC
Start: 2021-04-04 — End: ?
  Filled 2021-04-04: qty 20

## 2021-04-04 MED ORDER — ONDANSETRON HCL 4 MG/2ML IJ SOLN
INTRAMUSCULAR | Status: DC | PRN
  Administered 2021-04-04: 4 mg via INTRAVENOUS

## 2021-04-04 MED ORDER — FENTANYL CITRATE PF 50 MCG/ML IJ SOSY: 25.0000 ug | PREFILLED_SYRINGE | INTRAMUSCULAR | Status: DC | PRN

## 2021-04-04 MED ORDER — LIDOCAINE-EPINEPHRINE 1 %-1:100000 IJ SOLN
INTRAMUSCULAR | Status: AC
  Filled 2021-04-04: qty 1

## 2021-04-04 MED ORDER — ONDANSETRON HCL 4 MG/2ML IJ SOLN
INTRAMUSCULAR | Status: AC
  Filled 2021-04-04: qty 2

## 2021-04-04 MED ORDER — PROPOFOL 10 MG/ML IV BOLUS
INTRAVENOUS | Status: DC | PRN
  Administered 2021-04-04: 100 mg via INTRAVENOUS

## 2021-04-04 MED ORDER — LIDOCAINE-EPINEPHRINE 1 %-1:100000 IJ SOLN
INTRAMUSCULAR | Status: DC | PRN
  Administered 2021-04-04: 7 mL

## 2021-04-04 MED ORDER — NEPRO/CARBSTEADY PO LIQD
237.0000 mL | Freq: Two times a day (BID) | ORAL | Status: DC
  Administered 2021-04-04 – 2021-04-06 (×4): 237 mL via ORAL
  Filled 2021-04-04 (×4): qty 237

## 2021-04-04 MED ORDER — ONDANSETRON HCL 4 MG/2ML IJ SOLN: 4.0000 mg | Freq: Once | INTRAMUSCULAR | Status: DC | PRN

## 2021-04-04 MED ORDER — DEXAMETHASONE SODIUM PHOSPHATE 10 MG/ML IJ SOLN
INTRAMUSCULAR | Status: DC | PRN
  Administered 2021-04-04: 10 mg via INTRAVENOUS

## 2021-04-04 MED ORDER — LIDOCAINE HCL (PF) 2 % IJ SOLN
INTRAMUSCULAR | Status: AC
  Filled 2021-04-04: qty 5

## 2021-04-04 MED ORDER — LIDOCAINE HCL (CARDIAC) PF 100 MG/5ML IV SOSY
PREFILLED_SYRINGE | INTRAVENOUS | Status: DC | PRN
  Administered 2021-04-04: 50 mg via INTRAVENOUS

## 2021-04-04 MED ORDER — DEXAMETHASONE SODIUM PHOSPHATE 10 MG/ML IJ SOLN
INTRAMUSCULAR | Status: AC
  Filled 2021-04-04: qty 1

## 2021-04-04 MED ORDER — FENTANYL CITRATE (PF) 100 MCG/2ML IJ SOLN
INTRAMUSCULAR | Status: AC
  Filled 2021-04-04: qty 2

## 2021-04-04 MED ORDER — FENTANYL CITRATE (PF) 100 MCG/2ML IJ SOLN
INTRAMUSCULAR | Status: DC | PRN
  Administered 2021-04-04: 25 ug via INTRAVENOUS

## 2021-04-04 SURGICAL SUPPLY — 35 items
BAG COUNTER SPONGE SURGICOUNT (BAG) IMPLANT
BAG DRN RND TRDRP ANRFLXCHMBR (UROLOGICAL SUPPLIES) ×2
BAG SPNG CNTER NS LX DISP (BAG)
BAG URINE DRAIN 2000ML AR STRL (UROLOGICAL SUPPLIES) ×4 IMPLANT
BALLN NEPHROSTOMY (BALLOONS)
BALLOON NEPHROSTOMY (BALLOONS) IMPLANT
BNDG GAUZE ELAST 4 BULKY (GAUZE/BANDAGES/DRESSINGS) ×1 IMPLANT
CATH FOLEY 2W COUNCIL 5CC 16FR (CATHETERS) ×3 IMPLANT
CATH FOLEY 2W COUNCIL 5CC 18FR (CATHETERS) ×1 IMPLANT
CATH URETL OPEN END 6FR 70 (CATHETERS) IMPLANT
CLOTH BEACON ORANGE TIMEOUT ST (SAFETY) ×2 IMPLANT
DRAPE SHEET LG 3/4 BI-LAMINATE (DRAPES) ×4 IMPLANT
DRSG PAD ABDOMINAL 8X10 ST (GAUZE/BANDAGES/DRESSINGS) ×1 IMPLANT
ELECT REM PT RETURN 15FT ADLT (MISCELLANEOUS) ×3 IMPLANT
GAUZE SPONGE 4X4 12PLY STRL (GAUZE/BANDAGES/DRESSINGS) ×3 IMPLANT
GLOVE SURG ENC MOIS LTX SZ7.5 (GLOVE) ×4 IMPLANT
GLOVE SURG NEOPR MICRO LF SZ8 (GLOVE) ×5 IMPLANT
GLOVE SURG UNDER LTX SZ8 (GLOVE) ×8 IMPLANT
GLOVE SURG UNDER POLY LF SZ7 (GLOVE) ×3 IMPLANT
GOWN STRL REUS W/ TWL XL LVL3 (GOWN DISPOSABLE) ×4 IMPLANT
GOWN STRL REUS W/TWL XL LVL3 (GOWN DISPOSABLE) ×9
GUIDEWIRE ANG ZIPWIRE 038X150 (WIRE) IMPLANT
GUIDEWIRE STR DUAL SENSOR (WIRE) ×4 IMPLANT
KIT BASIN OR (CUSTOM PROCEDURE TRAY) ×3 IMPLANT
KIT TURNOVER KIT A (KITS) ×1 IMPLANT
MANIFOLD NEPTUNE II (INSTRUMENTS) ×1 IMPLANT
NS IRRIG 1000ML POUR BTL (IV SOLUTION) ×3 IMPLANT
PACK CYSTO (CUSTOM PROCEDURE TRAY) ×3 IMPLANT
PACK GENERAL/GYN (CUSTOM PROCEDURE TRAY) ×3 IMPLANT
PENCIL SMOKE EVACUATOR (MISCELLANEOUS) ×1 IMPLANT
SWAB COLLECTION DEVICE MRSA (MISCELLANEOUS) IMPLANT
SWAB CULTURE ESWAB REG 1ML (MISCELLANEOUS) IMPLANT
TOWEL OR 17X26 10 PK STRL BLUE (TOWEL DISPOSABLE) ×3 IMPLANT
UNDERPAD 30X36 HEAVY ABSORB (UNDERPADS AND DIAPERS) IMPLANT
WATER STERILE IRR 3000ML UROMA (IV SOLUTION) ×3 IMPLANT

## 2021-04-04 NOTE — Op Note (Signed)
Preoperative diagnosis: Perineal abscess, urethral stricture ? ?Postoperative diagnosis: Same ? ?Procedure: Cystoscopy with Foley catheter exchange, incision and drainage of posterior scrotal and perineal abscess 6 cm ? ?Surgeon: Junious Silk ? ?Anesthesia: General ? ?Indication for procedure: Wanes a 88 year old male with history of urethral stricture disease, bilateral hydronephrosis.  He presented with acute rise in his creatinine and nursing could not place the catheter.  Dr. Raliegh Ip dilated urethral stricture and placed Foley catheter.  Yesterday about 2 weeks from catheter placement he developed scrotal swelling and pain and drainage from the perineum.  There was no well organized fluid collection on the CT scan but heavy stranding and fluid in the perineum and posterior scrotum.  On exam there is a fluctuant draining area. ? ?Findings: As far as his urethra on cystoscopy there was a distal penile urethral/fossa stricture that was well dilated.  The remainder of the ureter-penile urethra, bulbar urethra all appeared healthy and viable.  Prostate was enlarged with obstructing lateral lobes but easily traversable.  Bladder was heavily trabeculated with ureteral orifice ease that were widely patent.  From the looks of this, it appears he would have chronic bilateral reflux.  There were no stones or foreign body in the bladder.  No obvious mucosal lesions. ? ?On exam under anesthesia there was penoscrotal edema.  When the penile skin was retracted the glans and meatus appeared normal.  The testicles were palpably normal.  Anterior scrotum was benign, the posterior scrotum was indurated with a fluctuant area in the posterior scrotum that connected to fluctuant area on the perineum.  1 small area on the perineum and come to ahead and was draining pus.  The area on the perineum with fluctuance was 4 cm and this cavity went 2 to 3 cm up into the posterior scrotum for a total with of about 6 to 7 cm.  A 4 cm perineal incision  was made to open this up in the finger probed the depth of the wound which was 3.5 cm.  And again more anterior up into the posterior scrotum for a few centimeters.  All these areas were opened up and drained.  Cultures were obtained.  The tissues appeared viable and nonnecrotic.  There was no involvement posteriorly of the rectum.  On digital rectal exam the rectal area was palpably normal and the prostate measured about 50 g and was smooth without hard area or nodule.  All landmarks preserved. ? ?Description of procedure: After consent was obtained the patient brought to the operating room.  He was placed on lithotomy position.  The Foley catheter was removed.  He was prepped and draped in the usual sterile fashion.  A timeout was performed to confirm the patient and procedure.  I passed a flexible cystoscope easily into the bladder inspected the bladder and the urethra.  Ureteral orifice ease inspected.  I passed a Glidewire and backed out the scope.  An 74 [place] council tip catheter was passed over the wire and left to gravity drainage.   ? ?I then turned my attention to the perineum.  The scrotum was elevated.  The area that was draining was incised with a 15 blade posteriorly but more anteriorly toward the scrotum.  This drained purulence.  Anaerobic and aerobic cultures were obtained out of the cavity.  Cavity was probed with a finger and extended to a depth of 3-1/2 cm more anteriorly toward the posterior scrotum 3-4 more centimeters.  Cavity was probed and broken up.  It was copiously  irrigated with sterile saline.  Hemostasis was ensured with the Bovie.  The wound was packed with saline moistened Kerlix.  He was awakened and placed supine.  An ABD and mesh underwear was placed.  He was awakened taken recovery in stable condition. ? ?Complications: None ? ?Blood loss: Minimal ? ?Specimens: Wound cultures to lab ? ?Drains: 18 [place] council tip catheter ? ?Disposition: Patient stable to PACU - I spoke with  Orrin Brigham and daughter (nurse)  - procedure , post-op care, follow-up ?

## 2021-04-04 NOTE — Anesthesia Postprocedure Evaluation (Signed)
Anesthesia Post Note ? ?Patient: TONIO SEIDER ? ?Procedure(s) Performed: CYSTOSCOPY WITH FOLEY CATHETER EXCHANGE (Urethra) ?INCISION AND DRAINAGE ABSCESS (Perineum) ? ?  ? ?Patient location during evaluation: PACU ?Anesthesia Type: General ?Level of consciousness: awake ?Pain management: pain level controlled ?Vital Signs Assessment: post-procedure vital signs reviewed and stable ?Respiratory status: spontaneous breathing, nonlabored ventilation, respiratory function stable and patient connected to nasal cannula oxygen ?Cardiovascular status: blood pressure returned to baseline and stable ?Postop Assessment: no apparent nausea or vomiting ?Anesthetic complications: no ? ? ?No notable events documented. ? ?Last Vitals:  ?Vitals:  ? 04/04/21 1330 04/04/21 1411  ?BP: (!) 117/93 (!) 148/80  ?Pulse: 83 77  ?Resp: 19 18  ?Temp: 36.4 ?C (!) 36.4 ?C  ?SpO2: 100% 100%  ?  ?Last Pain:  ?Vitals:  ? 04/04/21 1457  ?TempSrc:   ?PainSc: 7   ? ? ?  ?  ?  ?  ?  ?  ? ?Bion Todorov P Katana Berthold ? ? ? ? ?

## 2021-04-04 NOTE — Anesthesia Procedure Notes (Signed)
Procedure Name: LMA Insertion ?Date/Time: 04/04/2021 12:26 PM ?Performed by: Lind Covert, CRNA ?Pre-anesthesia Checklist: Patient identified, Emergency Drugs available, Suction available and Patient being monitored ?Patient Re-evaluated:Patient Re-evaluated prior to induction ?Oxygen Delivery Method: Circle system utilized ?Preoxygenation: Pre-oxygenation with 100% oxygen ?Induction Type: IV induction ?LMA: LMA inserted ?LMA Size: 4.0 ?Tube type: Oral ?Number of attempts: 1 ?Placement Confirmation: positive ETCO2 and breath sounds checked- equal and bilateral ?Tube secured with: Tape ?Dental Injury: Teeth and Oropharynx as per pre-operative assessment  ? ? ? ? ?

## 2021-04-04 NOTE — Progress Notes (Addendum)
Subjective: Jesus Kim is without complaint this morning.  He says the scrotal tenderness has improved on antibiotics. ? ?Objective: ?Vitals:  ? 04/03/21 2250 04/04/21 0458  ?BP: (!) 149/70 131/72  ?Pulse: 83 87  ?Resp: 18 20  ?Temp: 98.2 ?F (36.8 ?C) 97.9 ?F (36.6 ?C)  ?SpO2: 99% 99%  ? ?White count 8.7, hemoglobin 9.8, creatinine 2.28 ? ?Physical exam- ?No acute distress, alert and oriented ?Scrotal edema and erythema stable.  Posterior to the scrotum and the perineum there remains fluctuance and draining gray fluid and purulence.  Nontender which is an improvement from the exam last night. ? ?Assessment/plan- ? ?1) draining perineal abscess-although this is draining, his white count and tenderness has improved on abx, he still has some erythema and edema of the scrotum and some drainage and fluctuance in the perineum.  It is difficult to get a good exam in the bed.  He is going to need catheter change.  In light of all that again we went over the nature risks benefits and alternatives to cystoscopy with stricture dilation, Foley catheter exchange, exam under anesthesia with incision and drainage and washout of the perineum.  If we continue antibiotics and observation he will have a soupy, chronically draining perineal wound.  All questions answered and he elects to proceed. ? ?2) urethral stricture-again we discussed dilation.  We want to dilate enough to just upsize his catheter to a 16 [place].  Also lightly incise and drain the perineum. ? ?3) chronic bilateral hydro-creatinine has returned to baseline. ? ?Add; I spoke to [place]. I went over the procedures above. In thinking more about it, Jesus Kim may need an SP tube if the urethra is compromised. I discussed with Jesus Kim and Jesus Kim the nature r/b/a to [facility] and will have that as an option as well.  ?

## 2021-04-04 NOTE — Anesthesia Preprocedure Evaluation (Addendum)
Anesthesia Evaluation  ?Patient identified by MRN, date of birth, ID band ?Patient awake ? ? ? ?Reviewed: ?Allergy & Precautions, NPO status , Patient's Chart, lab work & pertinent test results ? ?Airway ?Mallampati: III ? ?TM Distance: >3 FB ?Neck ROM: Full ? ? ? Dental ? ?(+) Missing ?  ?Pulmonary ?former smoker,  ?  ?Pulmonary exam normal ?breath sounds clear to auscultation ? ? ? ? ? ? Cardiovascular ?hypertension, Pt. on medications ?Normal cardiovascular exam ?Rhythm:Regular Rate:Normal ? ? ?  ?Neuro/Psych ?negative neurological ROS ? negative psych ROS  ? GI/Hepatic ?negative GI ROS, Neg liver ROS,   ?Endo/Other  ?negative endocrine ROS ? Renal/GU ?CRFRenal disease  ? ?  ?Musculoskeletal ? ?(+) Arthritis ,  ? Abdominal ?  ?Peds ? Hematology ? ?(+) Blood dyscrasia, anemia ,   ?Anesthesia Other Findings ?URETERAL STRICTURE, PERINEAL ABCESS ? Reproductive/Obstetrics ? ?  ? ? ? ? ? ? ? ? ? ? ? ? ? ?  ?  ? ? ? ? ? ? ? ?Anesthesia Physical ?Anesthesia Plan ? ?ASA: 3 ? ?Anesthesia Plan: General  ? ?Post-op Pain Management:   ? ?Induction: Intravenous ? ?PONV Risk Score and Plan: 2 and Ondansetron, Dexamethasone and Treatment may vary due to age or medical condition ? ?Airway Management Planned: LMA ? ?Additional Equipment:  ? ?Intra-op Plan:  ? ?Post-operative Plan: Extubation in OR ? ?Informed Consent: I have reviewed the patients History and Physical, chart, labs and discussed the procedure including the risks, benefits and alternatives for the proposed anesthesia with the patient or authorized representative who has indicated his/her understanding and acceptance.  ? ? ? ?Dental advisory given ? ?Plan Discussed with: CRNA ? ?Anesthesia Plan Comments:   ? ? ? ? ? ?Anesthesia Quick Evaluation ? ?

## 2021-04-04 NOTE — Discharge Instructions (Signed)
Wet to dry dressing changes: ?All these items are available at your local pharmacy: ?Saline solution (or contact solution) ?Gauze (either 2?2 or 4?4) ?Tape ? ?INSTRUCTIONS: ? ?Open the gauze fully. ?Wet the gauze with the saline or contact solution. ?Squeeze the gauze until it is almost dry. ?Pack the gauze ONLY into the open area of the wound. ?Cover the wound with dry gauze. ?Use the tape to secure the gauze over the wound. ?Repeat this process twice daily (2) times a day. ?

## 2021-04-04 NOTE — Transfer of Care (Signed)
Immediate Anesthesia Transfer of Care Note ? ?Patient: Jesus Kim ? ?Procedure(s) Performed: FLEXIBLE CYSTOSCOPY WITH URETHRAL DILATATION (Urethra) ?INCISION AND DRAINAGE ABSCESS (Perineum) ? ?Patient Location: PACU ? ?Anesthesia Type:General ? ?Level of Consciousness: sedated ? ?Airway & Oxygen Therapy: Patient Spontanous Breathing and Patient connected to face mask oxygen ? ?Post-op Assessment: Report given to RN and Post -op Vital signs reviewed and stable ? ?Post vital signs: Reviewed and stable ? ?Last Vitals:  ?Vitals Value Taken Time  ?BP 104/61 04/04/21 1315  ?Temp    ?Pulse 83 04/04/21 1315  ?Resp 14 04/04/21 1315  ?SpO2 98 % 04/04/21 1315  ?Vitals shown include unvalidated device data. ? ?Last Pain:  ?Vitals:  ? 04/04/21 1047  ?TempSrc: Oral  ?PainSc:   ?   ? ?Patients Stated Pain Goal: 2 (03/30/21 2110) ? ?Complications: No notable events documented. ?

## 2021-04-04 NOTE — Progress Notes (Signed)
Occupational Therapy Treatment ?Patient Details ?Name: Jesus Kim ?MRN: [id number] ?DOB: 05/13/1924 ?Today's Date: 04/04/2021 ? ? ?History of present illness patient is a 88 year old male who presented to the hosptial after recommendation from PCP with abnormal lab work. patient was found to have AKI, severe urosepsis, positive proteus mirabilis, GI bleed with melena, acute blood loss anemia,  patient has 1 unit PRBC transfusion on 3/18. PMH: colon cancer, CKD,hyperlipidemia, melanoma, arthritis ?  ?OT comments ? Treatment focused on standing ADL task and mobility in room to promote strength and activity tolerance. Patient able to stand at sink for 4 minutes today. Min guard to supervision with RW with in room ambulation. Progressing well. Cont POC.  ? ?Recommendations for follow up therapy are one component of a multi-disciplinary discharge planning process, led by the attending physician.  Recommendations may be updated based on patient status, additional functional criteria and insurance authorization. ?   ?Follow Up Recommendations ? Home health OT  ?  ?Assistance Recommended at Discharge Frequent or constant Supervision/Assistance  ?Patient can return home with the following ? A little help with walking and/or transfers;A lot of help with bathing/dressing/bathroom;Assistance with cooking/housework;Help with stairs or ramp for entrance ?  ?Equipment Recommendations ? BSC/3in1  ?  ?Recommendations for Other Services   ? ?  ?Precautions / Restrictions Precautions ?Precautions: Fall ?Precaution Comments: monitor HR ?Restrictions ?Weight Bearing Restrictions: No  ? ? ?  ? ?Mobility Bed Mobility ?  ?  ?  ?  ?  ?  ?  ?  ?  ? ?Transfers ?  ?  ?  ?  ?  ?  ?  ?  ?  ?  ?  ?  ?Balance Overall balance assessment: Mild deficits observed, not formally tested ?  ?  ?  ?  ?  ?  ?  ?  ?  ?  ?  ?  ?  ?  ?  ?  ?  ?  ?   ? ?ADL either performed or assessed with clinical judgement  ? ?ADL Overall ADL's : Needs assistance/impaired ?   ?  ?Grooming: Dance movement psychotherapist;Wash/dry hands;Oral care ?Grooming Details (indicate cue type and reason): stood at sink x 4 minutes ?  ?  ?  ?  ?  ?  ?  ?  ?  ?  ?  ?  ?  ?  ?Functional mobility during ADLs: Min guard;Rolling walker (2 wheels) ?General ADL Comments: Patient supervision for bed mobility and min guard to supervision to ambulate in room with RW. No overt loss of balance. ?  ? ?Extremity/Trunk Assessment Upper Extremity Assessment ?Upper Extremity Assessment: Overall WFL for tasks assessed ?  ?  ?  ?  ?  ? ?Vision Patient Visual Report: No change from baseline ?  ?  ?Perception   ?  ?Praxis   ?  ? ?Cognition Arousal/Alertness: Awake/alert ?Behavior During Therapy: [facility] for tasks assessed/performed ?Overall Cognitive Status: Within Functional Limits for tasks assessed ?  ?  ?  ?  ?  ?  ?  ?  ?  ?  ?  ?  ?  ?  ?  ?  ?  ?  ?  ?   ?Exercises   ? ?  ?Shoulder Instructions   ? ? ?  ?General Comments    ? ? ?Pertinent Vitals/ Pain       Pain Assessment ?Pain Assessment: No/denies pain ? ?Home Living   ?  ?  ?  ?  ?  ?  ?  ?  ?  ?  ?  ?  ?  ?  ?  ?  ?  ?  ? ?  ?  Prior Functioning/Environment    ?  ?  ?  ?   ? ?Frequency ? Min 2X/week  ? ? ? ? ?  ?Progress Toward Goals ? ?OT Goals(current goals can now be found in the care plan section) ? Progress towards OT goals: Progressing toward goals ? ?Acute Rehab OT Goals ?Patient Stated Goal: go home ?OT Goal Formulation: With patient ?Time For Goal Achievement: 04/15/21 ?Potential to Achieve Goals: Good  ?Plan Discharge plan needs to be updated   ? ?Co-evaluation ? ? ?   ?  ?  ?OT goals addressed during session: ADL's and self-care ?  ? ?  ?AM-PAC OT "6 Clicks" Daily Activity     ?Outcome Measure ? ? Help from another person eating meals?: A Little ?Help from another person taking care of personal grooming?: A Little ?Help from another person toileting, which includes using toliet, bedpan, or urinal?: A Little ?Help from another person bathing (including washing, rinsing,  drying)?: A Little ?Help from another person to put on and taking off regular upper body clothing?: A Little ?Help from another person to put on and taking off regular lower body clothing?: A Lot ?6 Click Score: 17 ? ?  ?End of Session Equipment Utilized During Treatment: Rolling walker (2 wheels) ? ?OT Visit Diagnosis: Unsteadiness on feet (R26.81);Other abnormalities of gait and mobility (R26.89) ?  ?Activity Tolerance Patient tolerated treatment well ?  ?Patient Left in bed;with call bell/phone within reach;with bed alarm set ?  ?Nurse Communication Mobility status ?  ? ?   ? ?Time: 1610-9604 ?OT Time Calculation (min): 25 min ? ?Charges: OT General Charges ?$OT Visit: 1 Visit ?OT Evaluation ?$OT Eval Low Complexity: 1 Low ?OT Treatments ?$Self Care/Home Management : 8-22 mins ?$Therapeutic Activity: 8-22 mins ? ?Samanth Mirkin, OTR/L ?Acute Care Rehab Services  ?Office [phone] ?Pager: [phone]  ? ?Dezzie Badilla L Fadumo Heng ?04/04/2021, 8:46 AM ?

## 2021-04-04 NOTE — Progress Notes (Signed)
?PROGRESS NOTE ? ? ? ?Jesus Kim  ERX:[id number] DOB: [birth year] DOA: 03/19/2021 ?PCP: [name], MD  ? ?  ?Brief Narrative:  ?Jesus Kim is a 88 year old male with past medical history significant for CKD stage IV, hypertension, osteoarthritis, hyperlipidemia who presented to the hospital after being called by his PCP to present due to abnormal lab work.  He complained of decreased p.o. intake for the past couple of days and not feeling well.  Lab work revealed a creatinine of 4.58 which is increased from his baseline of 2.4-2.5.  Urology was consulted due to patient's history of ureteral stricture with chronic bladder outlet obstruction and chronic bilateral hydronephrosis.  Foley was placed.  Patient also met criteria for severe sepsis on admission and was treated with antibiotics.  Hospitalization further complicated by respiratory failure requiring intubation on 3/16.  He was extubated 3/17.  He also briefly required vasopressor support which was weaned on 3/13.  He had acute blood loss anemia and required 3 unit packed red blood cell on 3/12, 1 unit packed red blood cell 3/18.  He underwent EGD 3/13 which showed multiple ulcers without active bleed.  He also developed shock liver.  On 3/23, he developed scrotal cellulitis, drainage and urology was consulted.  He received additional unit of packed red blood cell on 3/23 preoperative management. ? ?New events last 24 hours / Subjective: ?Patient finished working with OT.  He is in good spirits and feeling well overall.  Continues to have scrotal swelling.  Planning for OR with urology today ? ?Assessment & Plan: ?  ?Principal Problem: ?  Acute kidney injury superimposed on CKD ([place]) ?Active Problems: ?  Hyperkalemia ?  Essential hypertension ?  Chronic hyponatremia ?  Acute blood loss anemia ?  Hemorrhagic shock ([place]) ?  Acute hypoxemic respiratory failure ([place]) ?  Shock liver ?  Elevated liver enzymes ?  Rectal pain ?  Hemorrhoids ? ? ?AKI on CKD 4 ?-With  bilateral chronic hydronephrosis and pan urethral stricture  ?-Baseline creatinine 2.1-2.6 ?-Urology consulted, Dr. Abner Greenspan who placed Foley catheter ?-Continue proscar  ?-Foley placement until outpatient follow-up with urology ?-AKI resolved and creatinine back to baseline ? ?Scrotal cellulitis ?-CT pelvis revealed moderate to severe inflammatory stranding involving the subcutaneous tissue of the scrotum and perineum concerning for cellulitis ?-Urology consulted, planning for cystoscopy with stricture dilatation and incision and drainage and washout of the perineum today ?-Switched to zosyn to cover proteus UTI as well as scrotal cellulitis  ? ?Severe sepsis secondary to Proteus UTI ?-Sepsis not present on admission, met sepsis criteria 3/15 ?-Urine culture 3/14 revealed Proteus mirabilis ?-Blood cultures remain negative ?-Zosyn 3/17-3/21, Unasyn 3/21-3/23 (7 days), back to Zosyn 3/23 >>  ?-Leukocytosis resolved ? ?Hemorrhagic shock in setting of GI bleed, acute blood loss anemia ?-Status post 3 unit prbc 3/12, 1 unit prbc 3/18, 1 unit prbc 3/23 ?-Status post EGD 3/13 which revealed multiple ulcers without active bleed ?-GI signed off 3/20 ?-PPI  ? ?Hypertension ?-Has been off blood pressure medications due to hemorrhagic shock.  Now blood pressure is elevated.  Resume Norvasc ? ?Acute hypoxemic respiratory failure ?-Required intubation 3/16, extubated 3/17 ?-Now on room air ? ?Shock liver ?-LFTs improved ? ?HLD ?-Zocor  ? ?DVT prophylaxis:  ?Place and maintain sequential compression device Start: 03/24/21 0816 ? ?Code Status: Full ?Family Communication: No family at bedside, updated wife over the phone ?Disposition Plan:  ?Status is: Inpatient ?Remains inpatient appropriate because: OR today ? ? ?Antimicrobials:  ?Anti-infectives (  From admission, onward)  ? ? Start     Dose/Rate Route Frequency Ordered Stop  ? 04/03/21 2000  piperacillin-tazobactam (ZOSYN) IVPB 3.375 g  Status:  Discontinued       ? 3.375 g ?12.5  mL/hr over 240 Minutes Intravenous Every 8 hours 04/03/21 1546 04/03/21 1549  ? 04/03/21 1800  doxycycline (VIBRA-TABS) tablet 100 mg  Status:  Discontinued       ? 100 mg Oral Every 12 hours 04/03/21 1431 04/03/21 1546  ? 04/03/21 1800  [MAR Hold]  piperacillin-tazobactam (ZOSYN) IVPB 2.25 g        (MAR Hold since Fri 04/04/2021 at 1036.Hold Reason: Transfer to a Procedural area)  ? 2.25 g ?100 mL/hr over 30 Minutes Intravenous Every 6 hours 04/03/21 1549    ? 04/01/21 1200  Ampicillin-Sulbactam (UNASYN) 3 g in sodium chloride 0.9 % 100 mL IVPB  Status:  Discontinued       ? 3 g ?200 mL/hr over 30 Minutes Intravenous Every 12 hours 04/01/21 1128 04/03/21 1431  ? 03/28/21 2000  piperacillin-tazobactam (ZOSYN) IVPB 2.25 g  Status:  Discontinued       ? 2.25 g ?100 mL/hr over 30 Minutes Intravenous Every 8 hours 03/28/21 1503 04/01/21 1128  ? 03/28/21 1200  piperacillin-tazobactam (ZOSYN) IVPB 3.375 g  Status:  Discontinued       ? 3.375 g ?12.5 mL/hr over 240 Minutes Intravenous Every 8 hours 03/28/21 1031 03/28/21 1503  ? 03/26/21 1000  cefTRIAXone (ROCEPHIN) 2 g in sodium chloride 0.9 % 100 mL IVPB  Status:  Discontinued       ? 2 g ?200 mL/hr over 30 Minutes Intravenous Every 24 hours 03/25/21 1324 03/28/21 1031  ? 03/25/21 1400  cefTRIAXone (ROCEPHIN) 1 g in sodium chloride 0.9 % 100 mL IVPB       ? 1 g ?200 mL/hr over 30 Minutes Intravenous  Once 03/25/21 1324 03/25/21 1409  ? 03/25/21 0830  cefTRIAXone (ROCEPHIN) 1 g in sodium chloride 0.9 % 100 mL IVPB  Status:  Discontinued       ? 1 g ?200 mL/hr over 30 Minutes Intravenous Every 24 hours 03/25/21 0734 03/25/21 1329  ? ?  ? ? ? ?Objective: ?Vitals:  ? 04/03/21 2011 04/03/21 2250 04/04/21 0458 04/04/21 1038  ?BP: (!) 143/78 (!) 149/70 131/72 (!) 158/82  ?Pulse: 90 83 87 85  ?Resp: _0 ?Temp: 97.9 ?F (36.6 ?C) 98.2 ?F (36.8 ?C) 97.9 ?F (36.6 ?C) 98.7 ?F (37.1 ?C)  ?TempSrc: Oral Axillary Oral Oral  ?SpO2: 100% 99% 99% 97%  ?Weight:      ?Height:       ? ? ?Intake/Output Summary (Last 24 hours) at 04/04/2021 1045 ?Last data filed at 04/04/2021 0505 ?Gross per 24 hour  ?Intake 820 ml  ?Output 2750 ml  ?Net -1930 ml  ? ? ?Filed Weights  ? 03/19/21 1648 03/28/21 1152  ?Weight: 70.3 kg 68 kg  ? ? ?Examination:  ?General exam: Appears calm and comfortable  ?Respiratory system: Clear to auscultation. Respiratory effort normal. No respiratory distress. No conversational dyspnea.  On room air ?Cardiovascular system: S1 & S2 heard, RRR. No murmurs. No pedal edema. ?Gastrointestinal system: Abdomen is nondistended, soft and nontender. Normal bowel sounds heard. ?Central nervous system: Alert and oriented. No focal neurological deficits. Speech clear.  ?Extremities: Symmetric in appearance  ?Psychiatry: Judgement and insight appear normal. Mood & affect appropriate.  ? ?Data Reviewed: I have personally reviewed following labs and imaging  studies ? ?CBC: ?Recent Labs  ?Lab 03/31/21 ?5053 04/01/21 ?9767 04/02/21 ?3419 04/03/21 ?3790 04/04/21 ?0327  ?WBC 16.4* 13.9* 14.5* 11.0* 8.7  ?NEUTROABS 13.7* 11.7* 10.8* 8.1* 5.9  ?HGB 8.1* 8.1* 8.8* 7.8* 9.8*  ?HCT 25.7* 25.7* 27.7* 24.8* 30.3*  ?MCV 88.9 88.9 88.5 88.3 89.6  ?PLT 315 304 340 298 294  ? ? ?Basic Metabolic Panel: ?Recent Labs  ?Lab 03/31/21 ?2409 04/01/21 ?7353 04/02/21 ?2992 04/03/21 ?4268 04/04/21 ?0327  ?NA 135 138 136 138 136  ?K 4.0 4.2 3.7 3.6 3.4*  ?CL 109 109 107 109 109  ?CO2 19* 21* 21* 22 20*  ?GLUCOSE 85 113* 108* 95 95  ?BUN 43* 37* 29* 22 18  ?CREATININE 2.61* 2.57* 2.29* 2.34* 2.28*  ?CALCIUM 7.5* 7.6* 7.6* 7.4* 7.3*  ?MG 2.1 2.0 1.8 1.8 1.9  ?PHOS 3.1 3.6 2.6 3.4 3.8  ? ? ?GFR: ?Estimated Creatinine Clearance: 17.5 mL/min (A) (by C-G formula based on SCr of 2.28 mg/dL (H)). ?Liver Function Tests: ?Recent Labs  ?Lab 03/31/21 ?3419 04/01/21 ?6222 04/02/21 ?9798 04/03/21 ?9211 04/04/21 ?0327  ?AST 183* 75* 49* 37 45*  ?ALT 214* 153* 114* 86* 80*  ?ALKPHOS 124 105 91 80 74  ?BILITOT 0.2* 0.5 0.2* 0.2* 0.3   ?PROT 5.3* 5.3* 5.4* 5.2* 5.4*  ?ALBUMIN 1.8* 1.9* 2.0* 1.9* 2.0*  ? ? ?No results for input(s): LIPASE, AMYLASE in the last 168 hours. ? ?No results for input(s): AMMONIA in the last 168 hours. ?Coagulation Prof

## 2021-04-04 NOTE — Progress Notes (Signed)
Initial Nutrition Assessment ? ?DOCUMENTATION CODES:  ? ?Not applicable ? ?INTERVENTION:  ?- will order Nepro Shake BID, each supplement provides 425 kcal and 19 grams protein. ?- weigh patient today.  ?- complete NFPE when feasible. ? ? ?NUTRITION DIAGNOSIS:  ? ?Increased nutrient needs related to acute illness as evidenced by estimated needs. ? ?GOAL:  ? ?Patient will meet greater than or equal to 90% of their needs ? ?MONITOR:  ? ?PO intake, Supplement acceptance, Labs, Weight trends ? ?REASON FOR ASSESSMENT:  ? ?LOS (15 days) ? ?ASSESSMENT:  ? ?88 year old male with medical history of CKD stage IV, HTN, melanoma, chronic bladder outlet obstruction, chronic bilateral hydronephrosis, osteoarthritis, and HLD. He presented to the ED due to abnormal lab work (increased creatinine) at [place] office. He reported decreased PO intake and overall not feeling well for several days prior. On admission he met criteria for severe sepsis and was treated with abx. He had EGD on 3/13 which showed multiple ulcers without active bleeding; respiratory failure requiring intubation on 3/16 and extubation on 3/17. During this hospitalization he developed shock liver. On 3/23 he developed scrotal cellulitis. ? ?Patient is currently out of the room to OR. He was previously on Heart Healthy/Carb Modified diet since 3/18 and eating mainly 75-100%.  ? ?Weight on admission date of 3/8 documented as 155 lb and weight on 3/17 was 150 lb. He has not been weighed since 3/17. ? ?Per notes: ?- AKI on stage 4 CKD--AKI resolved ?- bilateral chronic hydronephrosis ?- scrotal cellulitis--in OR for cystoscopy with stricture dilatation, I&D, and perineum washout ?- met sepsis criteria on 3/15 ?- hemorrhagic shock in setting of GIB and ABLA--GI signed off on 3/20 ?- shock liver this admission with LFTs improving ? ? ?Labs reviewed; CBG: 99 mg/dl, K: 3.4 mmol/l, creatinine: 2.28 mg/dl, Ca: 7.3 mg/dl, GFR: 26 ml/min. ? ?Medications reviewed; sliding scale  novolog, 1 tablet prosight/day, 40 mg oral protonix BID, 40 mEq Klor-Con x1 dose 3/24. ? ?IVF; NS @ 50 ml/hr. ?  ? ?NUTRITION - FOCUSED PHYSICAL EXAM: ? ?Patient out of the room to OR. ? ?Diet Order:   ?Diet Order   ? ?       ?  Diet NPO time specified Except for: Sips with Meds  Diet effective midnight       ?  ? ?  ?  ? ?  ? ? ?EDUCATION NEEDS:  ? ?No education needs have been identified at this time ? ?Skin:  Skin Assessment: Reviewed RN Assessment ? ?Last BM:  3/23 (type 6 x1, large amount) ? ?Height:  ? ?Ht Readings from Last 1 Encounters:  ?03/19/21 _0  (1.676 m)  ? ? ?Weight:  ? ?Wt Readings from Last 1 Encounters:  ?03/28/21 68 kg  ? ? ?BMI:  Body mass index is 24.2 kg/m?. ? ?Estimated Nutritional Needs:  ?Kcal:  1800-2000 kcal ?Protein:  90-100 grams ?Fluid:  >/= 1.8 L/day ? ? ? ? ?Jesus Matin, MS, RD, LDN ?Registered Dietitian II ?Inpatient Clinical Nutrition ?RD pager # and on-call/weekend pager # available in [place]  ? ?

## 2021-04-04 NOTE — Progress Notes (Signed)
Physical Therapy Treatment ?Patient Details ?Name: Jesus Kim ?MRN: [id number] ?DOB: 1924/03/22 ?Today's Date: 04/04/2021 ? ? ?History of Present Illness patient is a 88 year old male who presented to the hosptial after recommendation from PCP with abnormal lab work. patient was found to have AKI, severe urosepsis, positive proteus mirabilis, GI bleed with melena, acute blood loss anemia,  patient has 1 unit PRBC transfusion on 3/18. PMH: colon cancer, CKD,hyperlipidemia, melanoma, arthritis ? ?  ?PT Comments  ? ? Pt looking better.  General Comments: AxO very pleasant and appears less confused but does ask repeat questions. Assisted OOB to amb in hallway went well.  General transfer comment: good safety cognition and use of hands to steady self. General Gait Details: tolerated an increased distance of 85 feet in hallway with walker.  Slow but steady.  Feeling "better" everyday.  Only c/o scrotal discomfort "from the foley". ?Pt scheduled for a Cystoscopy and cath exchange later today.  Pt plans to return home with full family support.  Pt will need a RW and BSC.    ?Recommendations for follow up therapy are one component of a multi-disciplinary discharge planning process, led by the attending physician.  Recommendations may be updated based on patient status, additional functional criteria and insurance authorization. ? ?Follow Up Recommendations ? Home health PT ?  ?  ?Assistance Recommended at Discharge Frequent or constant Supervision/Assistance  ?Patient can return home with the following A little help with walking and/or transfers;A little help with bathing/dressing/bathroom;Assistance with cooking/housework;Assist for transportation;Help with stairs or ramp for entrance ?  ?Equipment Recommendations ? Rolling walker (2 wheels);BSC/3in1  ?  ?Recommendations for Other Services   ? ? ?  ?Precautions / Restrictions Precautions ?Precautions: Fall ?Precaution Comments: monitor HR ?Restrictions ?Weight Bearing  Restrictions: No  ?  ? ?Mobility ? Bed Mobility ?Overal bed mobility: Needs Assistance ?Bed Mobility: Supine to Sit, Sit to Supine ?  ?  ?Supine to sit: Supervision, Min guard ?Sit to supine: Supervision, Min guard ?  ?General bed mobility comments: self able with increased time and use of rail ?  ? ?Transfers ?Overall transfer level: Needs assistance ?Equipment used: Rolling walker (2 wheels) ?Transfers: Sit to/from Stand ?Sit to Stand: Supervision, Min guard ?  ?  ?  ?  ?  ?General transfer comment: good safety cognition and use of hands to steady self. ?  ? ?Ambulation/Gait ?Ambulation/Gait assistance: Supervision, Min guard ?Gait Distance (Feet): 85 Feet ?Assistive device: Rolling walker (2 wheels) ?Gait Pattern/deviations: Step-through pattern, Decreased stride length ?Gait velocity: decreased ?  ?  ?General Gait Details: tolerated an increased distance of 85 feet in hallway with walker.  Slow but steady.  Feeling "better" everyday.  Only c/o scrotal discomfort "from the foley". ? ? ?Stairs ?  ?  ?  ?  ?  ? ? ?Wheelchair Mobility ?  ? ?Modified Rankin (Stroke Patients Only) ?  ? ? ?  ?Balance   ?  ?  ?  ?  ?  ?  ?  ?  ?  ?  ?  ?  ?  ?  ?  ?  ?  ?  ?  ? ?  ?Cognition Arousal/Alertness: Awake/alert ?Behavior During Therapy: [facility] for tasks assessed/performed ?Overall Cognitive Status: Within Functional Limits for tasks assessed ?  ?  ?  ?  ?  ?  ?  ?  ?  ?  ?  ?  ?  ?  ?  ?  ?General Comments: AxO very  pleasant and appears less confused but does ask repeat questions. ?  ?  ? ?  ?Exercises   ? ?  ?General Comments   ?  ?  ? ?Pertinent Vitals/Pain Pain Assessment ?Pain Assessment: Faces ?Faces Pain Scale: Hurts little more ?Pain Location: scrotum (red/edema) ?Pain Descriptors / Indicators: Grimacing, Guarding ?Pain Intervention(s): Monitored during session  ? ? ?Home Living   ?  ?  ?  ?  ?  ?  ?  ?  ?  ?   ?  ?Prior Function    ?  ?  ?   ? ?PT Goals (current goals can now be found in the care plan section) Progress  towards PT goals: Progressing toward goals ? ?  ?Frequency ? ? ? Min 3X/week ? ? ? ?  ?PT Plan Current plan remains appropriate  ? ? ?Co-evaluation   ?  ?  ?  ?  ? ?  ?AM-PAC PT "6 Clicks" Mobility   ?Outcome Measure ? Help needed turning from your back to your side while in a flat bed without using bedrails?: A Little ?Help needed moving from lying on your back to sitting on the side of a flat bed without using bedrails?: A Little ?Help needed moving to and from a bed to a chair (including a wheelchair)?: A Little ?Help needed standing up from a chair using your arms (e.g., wheelchair or bedside chair)?: A Little ?Help needed to walk in hospital room?: A Little ?Help needed climbing 3-5 steps with a railing? : A Lot ?6 Click Score: 17 ? ?  ?End of Session Equipment Utilized During Treatment: Gait belt ?Activity Tolerance: Patient limited by fatigue ?Patient left: in bed;with call bell/phone within reach;with bed alarm set;with family/visitor present ?Nurse Communication: Mobility status ?PT Visit Diagnosis: Muscle weakness (generalized) (M62.81);Unsteadiness on feet (R26.81);Difficulty in walking, not elsewhere classified (R26.2);Pain ?  ? ? ?Time: 6195-0932 ?PT Time Calculation (min) (ACUTE ONLY): 23 min ? ?Charges:  $Gait Training: 8-22 mins ?$Therapeutic Activity: 8-22 mins          ?          ? ?{Attikus Bartoszek  PTA ?Acute  Rehabilitation Services ?Pager      [phone] ?Office      [phone] ? ?

## 2021-04-05 ENCOUNTER — Encounter (HOSPITAL_COMMUNITY): Payer: Self-pay | Admitting: Urology

## 2021-04-05 LAB — COMPREHENSIVE METABOLIC PANEL
ALT: 68 U/L — ABNORMAL HIGH (ref 0–44)
AST: 31 U/L (ref 15–41)
Albumin: 2 g/dL — ABNORMAL LOW (ref 3.5–5.0)
Alkaline Phosphatase: 66 U/L (ref 38–126)
Anion gap: 5 (ref 5–15)
BUN: 22 mg/dL (ref 8–23)
CO2: 21 mmol/L — ABNORMAL LOW (ref 22–32)
Calcium: 7.4 mg/dL — ABNORMAL LOW (ref 8.9–10.3)
Chloride: 109 mmol/L (ref 98–111)
Creatinine, Ser: 2.31 mg/dL — ABNORMAL HIGH (ref 0.61–1.24)
GFR, Estimated: 25 mL/min — ABNORMAL LOW (ref >60)
Glucose, Bld: 120 mg/dL — ABNORMAL HIGH (ref 70–99)
Potassium: 4.5 mmol/L (ref 3.5–5.1)
Sodium: 135 mmol/L (ref 135–145)
Total Bilirubin: 0.2 mg/dL — ABNORMAL LOW (ref 0.3–1.2)
Total Protein: 5.5 g/dL — ABNORMAL LOW (ref 6.5–8.1)

## 2021-04-05 LAB — CBC
HCT: 28.4 % — ABNORMAL LOW (ref 39.0–52.0)
Hemoglobin: 9 g/dL — ABNORMAL LOW (ref 13.0–17.0)
MCH: 28.3 pg (ref 26.0–34.0)
MCHC: 31.7 g/dL (ref 30.0–36.0)
MCV: 89.3 fL (ref 80.0–100.0)
Platelets: 271 10*3/uL (ref 150–400)
RBC: 3.18 MIL/uL — ABNORMAL LOW (ref 4.22–5.81)
RDW: 18 % — ABNORMAL HIGH (ref 11.5–15.5)
WBC: 7.5 10*3/uL (ref 4.0–10.5)
nRBC: 0 % (ref 0.0–0.2)

## 2021-04-05 LAB — GLUCOSE, CAPILLARY
Glucose-Capillary: 101 mg/dL — ABNORMAL HIGH (ref 70–99)
Glucose-Capillary: 96 mg/dL (ref 70–99)
Glucose-Capillary: 132 mg/dL — ABNORMAL HIGH (ref 70–99)
Glucose-Capillary: 94 mg/dL (ref 70–99)

## 2021-04-05 MED ORDER — AMOXICILLIN-POT CLAVULANATE 500-125 MG PO TABS
1.0000 | ORAL_TABLET | Freq: Two times a day (BID) | ORAL | Status: DC
  Administered 2021-04-05 – 2021-04-06 (×2): 500 mg via ORAL
  Filled 2021-04-05 (×2): qty 1

## 2021-04-05 NOTE — Progress Notes (Addendum)
Urology Inpatient Progress Report ? ? ? ?Intv/Subj: ?No acute events overnight. ?Patient is without complaint. ? ?Principal Problem: ?  Acute kidney injury superimposed on CKD ([place]) ?Active Problems: ?  Chronic hyponatremia ?  Hyperkalemia ?  Essential hypertension ?  Acute blood loss anemia ?  Hemorrhagic shock ([place]) ?  Acute hypoxemic respiratory failure ([place]) ?  Shock liver ?  Elevated liver enzymes ?  Rectal pain ?  Hemorrhoids ? ?Current Facility-Administered Medications  ?Medication Dose Route Frequency Provider Last Rate Last Admin  ? 0.9 %  sodium chloride infusion  250 mL Intravenous Continuous Chesley Mires, MD 10 mL/hr at 04/04/21 1218 Continued from Pre-op at 04/04/21 1218  ? acetaminophen (TYLENOL) tablet 650 mg  650 mg Oral Q6H PRN Dessa Phi, DO   650 mg at 04/04/21 2058  ? amLODipine (NORVASC) tablet 2.5 mg  2.5 mg Oral BID Dessa Phi, DO   2.5 mg at 04/05/21 2637  ? chlorhexidine (PERIDEX) 0.12 % solution 15 mL  15 mL Mouth Rinse BID Olalere, Adewale A, MD   15 mL at 04/05/21 0940  ? Chlorhexidine Gluconate Cloth 2 % PADS 6 each  6 each Topical Daily Donne Hazel, MD   6 each at 04/04/21 2200  ? feeding supplement (NEPRO CARB STEADY) liquid 237 mL  237 mL Oral BID BM Festus Aloe, MD   237 mL at 04/05/21 8588  ? fentaNYL (SUBLIMAZE) injection 12.5 mcg  12.5 mcg Intravenous Q2H PRN Dessa Phi, DO      ? finasteride (PROSCAR) tablet 5 mg  5 mg Oral Daily Eudelia Bunch, RPH   5 mg at 04/05/21 5027  ? gabapentin (NEURONTIN) capsule 200 mg  200 mg Oral QHS Eudelia Bunch, RPH   200 mg at 04/04/21 2050  ? hydrocortisone (ANUSOL-HC) suppository 25 mg  25 mg Rectal BID Allie Bossier, MD   25 mg at 04/05/21 0944  ? insulin aspart (novoLOG) injection 0-15 Units  0-15 Units Subcutaneous TID WC Dessa Phi, DO   3 Units at 04/04/21 1754  ? levalbuterol (XOPENEX) nebulizer solution 0.63 mg  0.63 mg Nebulization Q6H PRN Olalere, Adewale A, MD   0.63 mg at 03/28/21 1634  ? lidocaine  (LMX) 4 % cream   Topical BID Allie Bossier, MD   Given at 04/05/21 [phone]  ? lip balm (CARMEX) ointment   Topical PRN Gerald Leitz D, NP      ? MEDLINE mouth rinse  15 mL Mouth Rinse q12n4p Olalere, Adewale A, MD   15 mL at 04/05/21 1226  ? multivitamin (PROSIGHT) tablet 1 tablet  1 tablet Oral Daily Eudelia Bunch, RPH   1 tablet at 04/05/21 8786  ? ondansetron (ZOFRAN) injection 4 mg  4 mg Intravenous Q6H PRN Chotiner, Yevonne Aline, MD      ? pantoprazole (PROTONIX) EC tablet 40 mg  40 mg Oral BID AC Dessa Phi, DO   40 mg at 04/05/21 7672  ? piperacillin-tazobactam (ZOSYN) IVPB 2.25 g  2.25 g Intravenous Q6H Festus Aloe, MD 100 mL/hr at 04/05/21 1226 2.25 g at 04/05/21 1226  ? senna-docusate (Senokot-S) tablet 1 tablet  1 tablet Oral QHS PRN Chotiner, Yevonne Aline, MD   1 tablet at 04/02/21 [phone]  ? simvastatin (ZOCOR) tablet 20 mg  20 mg Oral Daily Dessa Phi, DO   20 mg at 04/05/21 0962  ? ? ? ?Objective: ?Vital: ?Vitals:  ? 04/04/21 2103 04/05/21 0005 04/05/21 8366 04/05/21 2947  ?BP: (!) 141/75 133/86  140/78 124/71  ?Pulse: 87 74 86   ?Resp: '20 20 16   '$ ?Temp: 97.6 ?F (36.4 ?C) 97.6 ?F (36.4 ?C) 97.6 ?F (36.4 ?C)   ?TempSrc: Oral Oral Oral   ?SpO2: 100% 98% 98%   ?Weight:      ?Height:      ? ?I/Os: ?I/O last 3 completed shifts: ?In: 1620 [P.O.:840; I.V.:120; Blood:360; IV Piggyback:300] ?Out: 3650 [Urine:3650] ? ?Physical Exam:  ?General: Patient is in no apparent distress ?Lungs: Normal respiratory effort, chest expands symmetrically. ?GI: The abdomen is soft and nontender without mass. ?Genitourinary: Left lower scrotal/perineal wound is clean without any fluctuance or surrounding crepitus.  No evidence of worsening infection ?Foley: Draining light red urine ?Ext: lower extremities symmetric ? ?Lab Results: ?Recent Labs  ?  04/03/21 ?0329 04/04/21 ?0327 04/05/21 ?0350  ?WBC 11.0* 8.7 7.5  ?HGB 7.8* 9.8* 9.0*  ?HCT 24.8* 30.3* 28.4*  ? ?Recent Labs  ?  04/03/21 ?0329 04/04/21 ?0327  04/05/21 ?0350  ?NA 138 136 135  ?K 3.6 3.4* 4.5  ?CL 109 109 109  ?CO2 22 20* 21*  ?GLUCOSE 95 95 120*  ?BUN '22 18 22  '$ ?CREATININE 2.34* 2.28* 2.31*  ?CALCIUM 7.4* 7.3* 7.4*  ? ?No results for input(s): LABPT, INR in the last 72 hours. ?No results for input(s): LABURIN in the last 72 hours. ?Results for orders placed or performed during the hospital encounter of 03/19/21  ?Resp Panel by RT-PCR (Flu A&B, Covid) Nasopharyngeal Swab     Status: None  ? Collection Time: 03/19/21  6:30 PM  ? Specimen: Nasopharyngeal Swab; Nasopharyngeal(NP) swabs in vial transport medium  ?Result Value Ref Range Status  ? SARS Coronavirus 2 by RT PCR NEGATIVE NEGATIVE Final  ?  Comment: (NOTE) ?SARS-CoV-2 target nucleic acids are NOT DETECTED. ? ?The SARS-CoV-2 RNA is generally detectable in upper respiratory ?specimens during the acute phase of infection. The lowest ?concentration of SARS-CoV-2 viral copies this assay can detect is ?138 copies/mL. A negative result does not preclude SARS-Cov-2 ?infection and should not be used as the sole basis for treatment or ?other patient management decisions. A negative result may occur with  ?improper specimen collection/handling, submission of specimen other ?than nasopharyngeal swab, presence of viral mutation(s) within the ?areas targeted by this assay, and inadequate number of viral ?copies(<138 copies/mL). A negative result must be combined with ?clinical observations, patient history, and epidemiological ?information. The expected result is Negative. ? ?Fact Sheet for Patients:  ?EntrepreneurPulse.com.au ? ?Fact Sheet for Healthcare Providers:  ?IncredibleEmployment.be ? ?This test is no t yet approved or cleared by the [place] FDA and  ?has been authorized for detection and/or diagnosis of SARS-CoV-2 by ?FDA under an Emergency Use Authorization (EUA). This EUA will remain  ?in effect (meaning this test can be used) for the duration of the ?COVID-19  declaration under Section 564(b)(1) of the Act, 21 ?U.S.C.section 360bbb-3(b)(1), unless the authorization is terminated  ?or revoked sooner.  ? ? ?  ? Influenza A by PCR NEGATIVE NEGATIVE Final  ? Influenza B by PCR NEGATIVE NEGATIVE Final  ?  Comment: (NOTE) ?The Xpert Xpress SARS-CoV-2/FLU/RSV plus assay is intended as an aid ?in the diagnosis of influenza from Nasopharyngeal swab specimens and ?should not be used as a sole basis for treatment. Nasal washings and ?aspirates are unacceptable for Xpert Xpress SARS-CoV-2/FLU/RSV ?testing. ? ?Fact Sheet for Patients: ?EntrepreneurPulse.com.au ? ?Fact Sheet for Healthcare Providers: ?IncredibleEmployment.be ? ?This test is not yet approved or cleared by the [place]  FDA and ?has been authorized for detection and/or diagnosis of SARS-CoV-2 by ?FDA under an Emergency Use Authorization (EUA). This EUA will remain ?in effect (meaning this test can be used) for the duration of the ?COVID-19 declaration under Section 564(b)(1) of the Act, 21 U.S.C. ?section 360bbb-3(b)(1), unless the authorization is terminated or ?revoked. ? ?Performed at [facility], [address] ?[place], [place] [address] ?  ?MRSA Next Gen by PCR, Nasal     Status: None  ? Collection Time: 03/23/21  9:37 AM  ? Specimen: Nasal Mucosa; Nasal Swab  ?Result Value Ref Range Status  ? MRSA by PCR Next Gen NOT DETECTED NOT DETECTED Final  ?  Comment: (NOTE) ?The GeneXpert MRSA Assay (FDA approved for NASAL specimens only), ?is one component of a comprehensive MRSA colonization surveillance ?program. It is not intended to diagnose MRSA infection nor to guide ?or monitor treatment for MRSA infections. ?Test performance is not FDA approved in patients less than 2 years ?old. ?Performed at [facility], [address] ?[place], [place] [address] ?  ?Culture, blood (routine x 2)     Status: None  ? Collection Time: 03/25/21  7:42 AM   ? Specimen: BLOOD  ?Result Value Ref Range Status  ? Specimen Description   Final  ?  BLOOD RIGHT ANTECUBITAL ?Performed at [facility], [place] [address], [place], [place] [address] ?  ? Special R

## 2021-04-05 NOTE — Progress Notes (Signed)
PHARMACY NOTE:  ANTIMICROBIAL RENAL DOSAGE ADJUSTMENT ? ?Current antimicrobial regimen includes a mismatch between antimicrobial dosage and estimated renal function.  As per policy approved by the Pharmacy & Therapeutics and Medical Executive Committees, the antimicrobial dosage will be adjusted accordingly. ? ?Current antimicrobial dosage:  augmentin '875mg'$  BID ? ?Indication: cellulitis ? ?Renal Function: ? ?Estimated Creatinine Clearance: 17.3 mL/min (A) (by C-G formula based on SCr of 2.31 mg/dL (H)). ?'[]'$      On intermittent HD, scheduled: ?'[]'$      On CRRT ?   ?Antimicrobial dosage has been changed to:  '500mg'$  BID ? ?Additional comments: ? ? ?Thank you for allowing pharmacy to be a part of this patient's care. ? ?Peggyann Juba, PharmD, BCPS ?Pharmacy: [phone] ?04/05/2021 12:38 PM ? ? ?  ? ?

## 2021-04-05 NOTE — Plan of Care (Signed)
?  Problem: Activity: ?Goal: Risk for activity intolerance will decrease ?Outcome: Progressing ?  ?Problem: Coping: ?Goal: Level of anxiety will decrease ?Outcome: Progressing ?  ?Problem: Clinical Measurements: ?Goal: Signs and symptoms of infection will decrease ?Outcome: Progressing ?  ?

## 2021-04-05 NOTE — Progress Notes (Signed)
Physical Therapy Treatment ?Patient Details ?Name: Jesus Kim ?MRN: [id number] ?DOB: March 31, 1924 ?Today's Date: 04/05/2021 ? ? ?History of Present Illness Patient is a 88 year old male who presented to the hosptial on 03/19/21 after recommendation from PCP with abnormal lab work. Patient was found to have AKI, severe urosepsis, positive proteus mirabilis, GI bleed with melena, acute blood loss anemia,  patient has 1 unit PRBC transfusion on 3/18 and Cystoscopy with Foley catheter exchange, incision and drainage of posterior scrotal and perineal abscess on 04/04/21. PMH: colon cancer, CKD,hyperlipidemia, melanoma, arthritis ? ?  ?PT Comments  ? ? Pt making good progress.  No significant changes since procedure yesterday - continue goals and plan of care.  Pt motivated to mobilize requiring only supervision and min cues.  Does have difficulty sitting due to scrotal and buttock pain.  ?   ?Recommendations for follow up therapy are one component of a multi-disciplinary discharge planning process, led by the attending physician.  Recommendations may be updated based on patient status, additional functional criteria and insurance authorization. ? ?Follow Up Recommendations ? Home health PT ?  ?  ?Assistance Recommended at Discharge Frequent or constant Supervision/Assistance  ?Patient can return home with the following A little help with walking and/or transfers;A little help with bathing/dressing/bathroom;Assistance with cooking/housework;Assist for transportation;Help with stairs or ramp for entrance ?  ?Equipment Recommendations ? Rolling walker (2 wheels);BSC/3in1  ?  ?Recommendations for Other Services   ? ? ?  ?Precautions / Restrictions Precautions ?Precautions: Fall  ?  ? ?Mobility ? Bed Mobility ?Overal bed mobility: Needs Assistance ?Bed Mobility: Supine to Sit, Sit to Supine ?  ?  ?Supine to sit: Supervision, HOB elevated ?Sit to supine: Supervision, HOB elevated ?  ?General bed mobility comments: self able with  increased time and use of rail ?  ? ?Transfers ?Overall transfer level: Needs assistance ?Equipment used: Rolling walker (2 wheels) ?Transfers: Sit to/from Stand ?Sit to Stand: Supervision ?  ?  ?  ?  ?  ?General transfer comment: performed x 2 with close supervision ?  ? ?Ambulation/Gait ?Ambulation/Gait assistance: Supervision ?Gait Distance (Feet): 160 Feet ?Assistive device: Rolling walker (2 wheels) ?Gait Pattern/deviations: Step-through pattern, Decreased stride length ?Gait velocity: decreased ?  ?  ?General Gait Details: Min cues for RW proximity with turns;  slow but steady gait; pt trying to decrease reliance on RW but feels he still needs ? ? ?Stairs ?  ?  ?  ?  ?  ? ? ?Wheelchair Mobility ?  ? ?Modified Rankin (Stroke Patients Only) ?  ? ? ?  ?Balance Overall balance assessment: Needs assistance ?Sitting-balance support: No upper extremity supported, Feet supported ?Sitting balance-Leahy Scale: Good ?  ?  ?Standing balance support: Bilateral upper extremity supported, No upper extremity supported ?Standing balance-Leahy Scale: Fair ?Standing balance comment: RW to ambulate but could static stand without support ?  ?  ?  ?  ?  ?  ?  ?  ?  ?  ?  ?  ? ?  ?Cognition Arousal/Alertness: Awake/alert ?Behavior During Therapy: [facility] for tasks assessed/performed ?Overall Cognitive Status: Within Functional Limits for tasks assessed ?  ?  ?  ?  ?  ?  ?  ?  ?  ?  ?  ?  ?  ?  ?  ?  ?  ?  ?  ? ?  ?Exercises   ? ?  ?General Comments General comments (skin integrity, edema, etc.): VSS ?  ?  ? ?Pertinent  Vitals/Pain Pain Assessment ?Pain Assessment: Faces ?Faces Pain Scale: Hurts little more ?Pain Location: scrotum (red/edema) ?Pain Descriptors / Indicators: Grimacing, Guarding ?Pain Intervention(s): Monitored during session, Repositioned (returned to bed to offload; pt asking about cream - notified RN)  ? ? ?Home Living   ?  ?  ?  ?  ?  ?  ?  ?  ?  ?   ?  ?Prior Function    ?  ?  ?   ? ?PT Goals (current goals can now be  found in the care plan section) Progress towards PT goals: Progressing toward goals ? ?  ?Frequency ? ? ? Min 3X/week ? ? ? ?  ?PT Plan Current plan remains appropriate  ? ? ?Co-evaluation   ?  ?  ?  ?  ? ?  ?AM-PAC PT "6 Clicks" Mobility   ?Outcome Measure ? Help needed turning from your back to your side while in a flat bed without using bedrails?: A Little ?Help needed moving from lying on your back to sitting on the side of a flat bed without using bedrails?: A Little ?Help needed moving to and from a bed to a chair (including a wheelchair)?: A Little ?Help needed standing up from a chair using your arms (e.g., wheelchair or bedside chair)?: A Little ?Help needed to walk in hospital room?: A Little ?Help needed climbing 3-5 steps with a railing? : A Little ?6 Click Score: 18 ? ?  ?End of Session Equipment Utilized During Treatment: Gait belt ?Activity Tolerance: Patient tolerated treatment well ?Patient left: in bed;with call bell/phone within reach;with bed alarm set ?Nurse Communication: Mobility status;Other (comment) (pt asking about cream for incision/wound and he was concerned about blood in foley) ?PT Visit Diagnosis: Muscle weakness (generalized) (M62.81);Unsteadiness on feet (R26.81);Difficulty in walking, not elsewhere classified (R26.2);Pain ?  ? ? ?Time: 8032-1224 ?PT Time Calculation (min) (ACUTE ONLY): 19 min ? ?Charges:  $Gait Training: 8-22 mins          ?          ? ?Abran Richard, PT ?Acute Rehab Services ?Pager [phone] ?[facility] [phone] ? ? ? ?Mikael Spray Lean Jaeger ?04/05/2021, 11:39 AM ? ?

## 2021-04-05 NOTE — Progress Notes (Signed)
?PROGRESS NOTE ? ? ? ?Jesus Kim  IWP:[id number] DOB: 03-Mar-1924 DOA: 03/19/2021 ?PCP: [name], MD  ? ?  ?Brief Narrative:  ?Jesus Kim is a 88 year old male with past medical history significant for CKD stage IV, hypertension, osteoarthritis, hyperlipidemia who presented to the hospital after being called by his PCP to present due to abnormal lab work.  He complained of decreased p.o. intake for the past couple of days and not feeling well.  Lab work revealed a creatinine of 4.58 which is increased from his baseline of 2.4-2.5.  Urology was consulted due to patient's history of ureteral stricture with chronic bladder outlet obstruction and chronic bilateral hydronephrosis.  Foley was placed.  Patient also met criteria for severe sepsis on admission and was treated with antibiotics.  Hospitalization further complicated by respiratory failure requiring intubation on 3/16.  He was extubated 3/17.  He also briefly required vasopressor support which was weaned on 3/13.  He had acute blood loss anemia and required 3 unit packed red blood cell on 3/12, 1 unit packed red blood cell 3/18.  He underwent EGD 3/13 which showed multiple ulcers without active bleed.  He also developed shock liver.  On 3/23, he developed scrotal cellulitis, drainage and urology was consulted.  He received additional unit of packed red blood cell on 3/23 preoperative management.  On 3/24, patient underwent cystoscopy with Foley catheter exchange, incision and drainage of posterior scrotal and perineal abscess with Dr. Junious Silk. ? ?New events last 24 hours / Subjective: ?Patient complains of some soreness in his groin but otherwise feeling well overall.  Has no complaints today.  In good spirits. ? ?Assessment & Plan: ?  ?Principal Problem: ?  Acute kidney injury superimposed on CKD ([place]) ?Active Problems: ?  Hyperkalemia ?  Essential hypertension ?  Chronic hyponatremia ?  Acute blood loss anemia ?  Hemorrhagic shock ([place]) ?  Acute  hypoxemic respiratory failure ([place]) ?  Shock liver ?  Elevated liver enzymes ?  Rectal pain ?  Hemorrhoids ? ? ?AKI on CKD 4 ?-With bilateral chronic hydronephrosis and pan urethral stricture  ?-Baseline creatinine 2.1-2.6 ?-Urology consulted, Dr. Abner Greenspan who placed Foley catheter, Foley catheter replaced in the OR 3/24 ?-Continue proscar  ?-Foley placement until outpatient follow-up with urology ?-AKI resolved and creatinine back to baseline ? ?Scrotal cellulitis ?-CT pelvis revealed moderate to severe inflammatory stranding involving the subcutaneous tissue of the scrotum and perineum concerning for cellulitis ?-Urology consulted, status post cystoscopy with Foley catheter exchange, incision and drainage of posterior scrotal and perineal abscess ?-Switched to zosyn to cover proteus UTI as well as scrotal cellulitis  ?-Wound culture from the OR pending ? ?Severe sepsis secondary to Proteus UTI ?-Sepsis not present on admission, met sepsis criteria 3/15 ?-Urine culture 3/14 revealed Proteus mirabilis ?-Blood cultures remain negative ?-Zosyn 3/17-3/21, Unasyn 3/21-3/23 (7 days), back to Zosyn 3/23 >>  ?-Leukocytosis resolved ? ?Hemorrhagic shock in setting of GI bleed, acute blood loss anemia ?-Status post 3 unit prbc 3/12, 1 unit prbc 3/18, 1 unit prbc 3/23 ?-Status post EGD 3/13 which revealed multiple ulcers without active bleed ?-GI signed off 3/20 ?-PPI  ? ?Hypertension ?-Has been off blood pressure medications due to hemorrhagic shock.  Now blood pressure is elevated.  Resume Norvasc ? ?Acute hypoxemic respiratory failure ?-Required intubation 3/16, extubated 3/17 ?-Now on room air ? ?Shock liver ?-LFTs improved ? ?HLD ?-Zocor  ? ?DVT prophylaxis:  ?Place and maintain sequential compression device Start: 03/24/21 0816 ? ?Code Status:  Full ?Family Communication: No family at bedside ?Disposition Plan:  ?Status is: Inpatient ?Remains inpatient appropriate because: Awaiting abscess wound culture.  Remains on IV  Zosyn ? ? ?Antimicrobials:  ?Anti-infectives (From admission, onward)  ? ? Start     Dose/Rate Route Frequency Ordered Stop  ? 04/03/21 2000  piperacillin-tazobactam (ZOSYN) IVPB 3.375 g  Status:  Discontinued       ? 3.375 g ?12.5 mL/hr over 240 Minutes Intravenous Every 8 hours 04/03/21 1546 04/03/21 1549  ? 04/03/21 1800  doxycycline (VIBRA-TABS) tablet 100 mg  Status:  Discontinued       ? 100 mg Oral Every 12 hours 04/03/21 1431 04/03/21 1546  ? 04/03/21 1800  piperacillin-tazobactam (ZOSYN) IVPB 2.25 g       ? 2.25 g ?100 mL/hr over 30 Minutes Intravenous Every 6 hours 04/03/21 1549    ? 04/01/21 1200  Ampicillin-Sulbactam (UNASYN) 3 g in sodium chloride 0.9 % 100 mL IVPB  Status:  Discontinued       ? 3 g ?200 mL/hr over 30 Minutes Intravenous Every 12 hours 04/01/21 1128 04/03/21 1431  ? 03/28/21 2000  piperacillin-tazobactam (ZOSYN) IVPB 2.25 g  Status:  Discontinued       ? 2.25 g ?100 mL/hr over 30 Minutes Intravenous Every 8 hours 03/28/21 1503 04/01/21 1128  ? 03/28/21 1200  piperacillin-tazobactam (ZOSYN) IVPB 3.375 g  Status:  Discontinued       ? 3.375 g ?12.5 mL/hr over 240 Minutes Intravenous Every 8 hours 03/28/21 1031 03/28/21 1503  ? 03/26/21 1000  cefTRIAXone (ROCEPHIN) 2 g in sodium chloride 0.9 % 100 mL IVPB  Status:  Discontinued       ? 2 g ?200 mL/hr over 30 Minutes Intravenous Every 24 hours 03/25/21 1324 03/28/21 1031  ? 03/25/21 1400  cefTRIAXone (ROCEPHIN) 1 g in sodium chloride 0.9 % 100 mL IVPB       ? 1 g ?200 mL/hr over 30 Minutes Intravenous  Once 03/25/21 1324 03/25/21 1409  ? 03/25/21 0830  cefTRIAXone (ROCEPHIN) 1 g in sodium chloride 0.9 % 100 mL IVPB  Status:  Discontinued       ? 1 g ?200 mL/hr over 30 Minutes Intravenous Every 24 hours 03/25/21 0734 03/25/21 1329  ? ?  ? ? ? ?Objective: ?Vitals:  ? 04/04/21 2103 04/05/21 0005 04/05/21 0449 04/05/21 2353  ?BP: (!) 141/75 133/86 140/78 124/71  ?Pulse: 87 74 86   ?Resp: '20 20 16   ' ?Temp: 97.6 ?F (36.4 ?C) 97.6 ?F (36.4 ?C)  97.6 ?F (36.4 ?C)   ?TempSrc: Oral Oral Oral   ?SpO2: 100% 98% 98%   ?Weight:      ?Height:      ? ? ?Intake/Output Summary (Last 24 hours) at 04/05/2021 1049 ?Last data filed at 04/05/2021 0400 ?Gross per 24 hour  ?Intake 800 ml  ?Output 1600 ml  ?Net -800 ml  ? ? ?Filed Weights  ? 03/19/21 1648 03/28/21 1152  ?Weight: 70.3 kg 68 kg  ? ? ?Examination:  ?General exam: Appears calm and comfortable  ?Respiratory system: Clear to auscultation. Respiratory effort normal. No respiratory distress. No conversational dyspnea.  On room air ?Cardiovascular system: S1 & S2 heard, RRR. No murmurs. No pedal edema. ?Gastrointestinal system: Abdomen is nondistended, soft and nontender. Normal bowel sounds heard. ?Central nervous system: Alert and oriented. No focal neurological deficits. Speech clear.  ?Extremities: Symmetric in appearance  ?Psychiatry: Judgement and insight appear normal. Mood & affect appropriate.  ? ?Data Reviewed:  I have personally reviewed following labs and imaging studies ? ?CBC: ?Recent Labs  ?Lab 03/31/21 ?5747 04/01/21 ?3403 04/02/21 ?7096 04/03/21 ?4383 04/04/21 ?0327 04/05/21 ?0350  ?WBC 16.4* 13.9* 14.5* 11.0* 8.7 7.5  ?NEUTROABS 13.7* 11.7* 10.8* 8.1* 5.9  --   ?HGB 8.1* 8.1* 8.8* 7.8* 9.8* 9.0*  ?HCT 25.7* 25.7* 27.7* 24.8* 30.3* 28.4*  ?MCV 88.9 88.9 88.5 88.3 89.6 89.3  ?PLT 315 304 340 298 294 271  ? ? ?Basic Metabolic Panel: ?Recent Labs  ?Lab 03/31/21 ?8184 04/01/21 ?0375 04/02/21 ?4360 04/03/21 ?6770 04/04/21 ?0327 04/05/21 ?0350  ?NA 135 138 136 138 136 135  ?K 4.0 4.2 3.7 3.6 3.4* 4.5  ?CL 109 109 107 109 109 109  ?CO2 19* 21* 21* 22 20* 21*  ?GLUCOSE 85 113* 108* 95 95 120*  ?BUN 43* 37* 29* '22 18 22  ' ?CREATININE 2.61* 2.57* 2.29* 2.34* 2.28* 2.31*  ?CALCIUM 7.5* 7.6* 7.6* 7.4* 7.3* 7.4*  ?MG 2.1 2.0 1.8 1.8 1.9  --   ?PHOS 3.1 3.6 2.6 3.4 3.8  --   ? ? ?GFR: ?Estimated Creatinine Clearance: 17.3 mL/min (A) (by C-G formula based on SCr of 2.31 mg/dL (H)). ?Liver Function Tests: ?Recent Labs   ?Lab 04/01/21 ?0326 04/02/21 ?3403 04/03/21 ?0329 04/04/21 ?0327 04/05/21 ?0350  ?AST 75* 49* 37 45* 31  ?ALT 153* 114* 86* 80* 68*  ?ALKPHOS 105 91 80 74 66  ?BILITOT 0.5 0.2* 0.2* 0.3 0.2*  ?PROT 5.3* 5.4* 5.2* 5.4*

## 2021-04-06 LAB — CBC
HCT: 29 % — ABNORMAL LOW (ref 39.0–52.0)
Hemoglobin: 9.2 g/dL — ABNORMAL LOW (ref 13.0–17.0)
MCH: 28.8 pg (ref 26.0–34.0)
MCHC: 31.7 g/dL (ref 30.0–36.0)
MCV: 90.6 fL (ref 80.0–100.0)
Platelets: 286 10*3/uL (ref 150–400)
RBC: 3.2 MIL/uL — ABNORMAL LOW (ref 4.22–5.81)
RDW: 18.1 % — ABNORMAL HIGH (ref 11.5–15.5)
WBC: 9.4 10*3/uL (ref 4.0–10.5)
nRBC: 0 % (ref 0.0–0.2)

## 2021-04-06 LAB — COMPREHENSIVE METABOLIC PANEL
ALT: 57 U/L — ABNORMAL HIGH (ref 0–44)
AST: 28 U/L (ref 15–41)
Albumin: 2 g/dL — ABNORMAL LOW (ref 3.5–5.0)
Alkaline Phosphatase: 60 U/L (ref 38–126)
Anion gap: 7 (ref 5–15)
BUN: 24 mg/dL — ABNORMAL HIGH (ref 8–23)
CO2: 22 mmol/L (ref 22–32)
Calcium: 7.4 mg/dL — ABNORMAL LOW (ref 8.9–10.3)
Chloride: 110 mmol/L (ref 98–111)
Creatinine, Ser: 2.54 mg/dL — ABNORMAL HIGH (ref 0.61–1.24)
GFR, Estimated: 23 mL/min — ABNORMAL LOW (ref >60)
Glucose, Bld: 100 mg/dL — ABNORMAL HIGH (ref 70–99)
Potassium: 3.9 mmol/L (ref 3.5–5.1)
Sodium: 139 mmol/L (ref 135–145)
Total Bilirubin: 0.3 mg/dL (ref 0.3–1.2)
Total Protein: 5.3 g/dL — ABNORMAL LOW (ref 6.5–8.1)

## 2021-04-06 LAB — AEROBIC CULTURE W GRAM STAIN (SUPERFICIAL SPECIMEN)

## 2021-04-06 LAB — GLUCOSE, CAPILLARY
Glucose-Capillary: 84 mg/dL (ref 70–99)
Glucose-Capillary: 88 mg/dL (ref 70–99)

## 2021-04-06 MED ORDER — AMOXICILLIN-POT CLAVULANATE 500-125 MG PO TABS: 1.0000 | ORAL_TABLET | Freq: Two times a day (BID) | ORAL | 0 refills | Status: AC

## 2021-04-06 MED ORDER — PANTOPRAZOLE SODIUM 40 MG PO TBEC: 40.0000 mg | DELAYED_RELEASE_TABLET | Freq: Two times a day (BID) | ORAL | 1 refills | Status: AC

## 2021-04-06 NOTE — Discharge Summary (Signed)
Physician Discharge Summary  ?Jesus Kim:[id number] DOB: 1924-11-13 DOA: 03/19/2021 ? ?PCP: [name], MD ? ?Admit date: 03/19/2021 ?Discharge date: 04/06/2021 ? ?Admitted From: Home ?Disposition:  Home with home health  ? ?Recommendations for Outpatient Follow-up:  ?Follow up with PCP in 1 week ?Follow up with [facility] ?Follow up with [facility] as needed  ? ?Discharge Condition: Stable, improved ?CODE STATUS: Full  ?Diet recommendation:  ?Diet Orders (From admission, onward)  ? ?  Start     Ordered  ? 04/04/21 1412  Diet Carb Modified Fluid consistency: Thin; Room service appropriate? Yes  Diet effective now       ?Question Answer Comment  ?Diet-HS Snack? Nothing   ?Calorie Level Medium 1600-2000   ?Fluid consistency: Thin   ?Room service appropriate? Yes   ?  ? 04/04/21 1411  ? ?  ?  ? ?  ? ?Brief/Interim Summary: ?Jesus Kim is a 88 year old male with past medical history significant for CKD stage IV, hypertension, osteoarthritis, hyperlipidemia who presented to the hospital after being called by his PCP to present due to abnormal lab work.  He complained of decreased p.o. intake for the past couple of days and not feeling well.  Lab work revealed a creatinine of 4.58 which is increased from his baseline of 2.4-2.5.  Urology was consulted due to patient's history of ureteral stricture with chronic bladder outlet obstruction and chronic bilateral hydronephrosis.  Foley was placed.  Patient also met criteria for severe sepsis on admission and was treated with antibiotics.  Hospitalization further complicated by respiratory failure requiring intubation on 3/16.  He was extubated 3/17.  He also briefly required vasopressor support which was weaned on 3/13.  He had acute blood loss anemia and required 3 unit packed red blood cell on 3/12, 1 unit packed red blood cell 3/18.  He underwent EGD 3/13 which showed multiple ulcers without active bleed.  He also developed shock liver.  On 3/23, he developed  scrotal cellulitis, drainage and urology was consulted.  He received additional unit of packed red blood cell on 3/23 preoperative management.  On 3/24, patient underwent cystoscopy with Foley catheter exchange, incision and drainage of posterior scrotal and perineal abscess with Dr. Junious Silk. ? ?Discharge Diagnoses:  ?Principal Problem: ?  Acute kidney injury superimposed on CKD ([place]) ?Active Problems: ?  Hyperkalemia ?  Essential hypertension ?  Chronic hyponatremia ?  Acute blood loss anemia ?  Hemorrhagic shock ([place]) ?  Acute hypoxemic respiratory failure ([place]) ?  Shock liver ?  Elevated liver enzymes ?  Rectal pain ?  Hemorrhoids ? ? ?AKI on CKD 4 ?-With bilateral chronic hydronephrosis and pan urethral stricture  ?-Baseline creatinine 2.1-2.6 ?-Urology consulted, Dr. Abner Greenspan who placed Foley catheter then Foley catheter replaced in the OR 3/24 ?-Continue proscar  ?-Lisinopril discontinued  ?-Foley placement until outpatient follow-up with urology ?-AKI resolved and creatinine back to baseline ?  ?Scrotal cellulitis ?-CT pelvis revealed moderate to severe inflammatory stranding involving the subcutaneous tissue of the scrotum and perineum concerning for cellulitis ?-Urology consulted, status post cystoscopy with Foley catheter exchange, incision and drainage of posterior scrotal and perineal abscess ?-Zosyn --> Augmentin  ?  ?Severe sepsis secondary to Proteus UTI ?-Sepsis not present on admission, met sepsis criteria 3/15 ?-Urine culture 3/14 revealed Proteus mirabilis ?-Blood cultures remain negative ?-Zosyn 3/17-3/21, Unasyn 3/21-3/23 (7 days), back to Zosyn 3/23 >> Augmentin 3/25  ?-Leukocytosis resolved ?  ?Hemorrhagic shock in setting of GI bleed, acute blood  loss anemia ?-Status post 3 unit prbc 3/12, 1 unit prbc 3/18, 1 unit prbc 3/23 ?-Status post EGD 3/13 which revealed multiple ulcers without active bleed ?-GI signed off 3/20 ?-PPI  ?-Hgb stable  ?  ?Hypertension ?-Has been off blood pressure medications  due to hemorrhagic shock.  Now blood pressure is elevated.  Resume Norvasc ? ?Acute hypoxemic respiratory failure ?-Required intubation 3/16, extubated 3/17 ?-Now on room air ?  ?Shock liver ?-LFTs improved ?  ?HLD ?-Zocor  ? ?Discharge Instructions ? ?Discharge Instructions   ? ? Call MD for:  difficulty breathing, headache or visual disturbances   Complete by: As directed ?  ? Call MD for:  extreme fatigue   Complete by: As directed ?  ? Call MD for:  hives   Complete by: As directed ?  ? Call MD for:  persistant dizziness or light-headedness   Complete by: As directed ?  ? Call MD for:  persistant nausea and vomiting   Complete by: As directed ?  ? Call MD for:  redness, tenderness, or signs of infection (pain, swelling, redness, odor or green/yellow discharge around incision site)   Complete by: As directed ?  ? Call MD for:  severe uncontrolled pain   Complete by: As directed ?  ? Call MD for:  temperature >100.4   Complete by: As directed ?  ? Change dressing (specify)   Complete by: As directed ?  ? Change packing once daily with saline moistened Kerlix  ? Discharge instructions   Complete by: As directed ?  ? You were cared for by a hospitalist during your hospital stay. If you have any questions about your discharge medications or the care you received while you were in the hospital after you are discharged, you can call the unit and ask to speak with the hospitalist on call if the hospitalist that took care of you is not available. Once you are discharged, your primary care physician will handle any further medical issues. Please note that NO REFILLS for any discharge medications will be authorized once you are discharged, as it is imperative that you return to your primary care physician (or establish a relationship with a primary care physician if you do not have one) for your aftercare needs so that they can reassess your need for medications and monitor your lab values.  ? Increase activity slowly    Complete by: As directed ?  ? ?  ? ?Allergies as of 04/06/2021   ? ?   Reactions  ? Sulfa Antibiotics Rash  ? ?  ? ?  ?Medication List  ?  ? ?STOP taking these medications   ? ?diphenhydramine-acetaminophen 25-500 MG Tabs tablet ?Commonly known as: TYLENOL PM ?  ?lisinopril 20 MG tablet ?Commonly known as: ZESTRIL ?  ? ?  ? ?TAKE these medications   ? ?acetaminophen 650 MG CR tablet ?Commonly known as: TYLENOL ?Take 650 mg by mouth every 8 (eight) hours as needed for pain. ?  ?amLODipine 2.5 MG tablet ?Commonly known as: NORVASC ?Take 2.5 mg by mouth 2 (two) times daily. ?  ?amoxicillin-clavulanate 500-125 MG tablet ?Commonly known as: AUGMENTIN ?Take 1 tablet (500 mg total) by mouth every 12 (twelve) hours for 7 days. ?  ?finasteride 5 MG tablet ?Commonly known as: PROSCAR ?Take 5 mg by mouth daily. ?  ?gabapentin 100 MG capsule ?Commonly known as: NEURONTIN ?Take 2 capsules (200 mg total) by mouth at bedtime. ?  ?pantoprazole 40 MG tablet ?Commonly known  as: PROTONIX ?Take 1 tablet (40 mg total) by mouth 2 (two) times daily before a meal. ?  ?PRESERVISION AREDS 2+MULTI VIT PO ?Take 2 tablets by mouth daily. ?  ?simvastatin 20 MG tablet ?Commonly known as: ZOCOR ?Take 20 mg by mouth daily. ?  ? ?  ? ?  ?  ? ? ?  ?Discharge Care Instructions  ?(From admission, onward)  ?  ? ? ?  ? ?  Start     Ordered  ? 04/06/21 0000  Change dressing (specify)       ?Comments: Change packing once daily with saline moistened Kerlix  ? 04/06/21 0904  ? ?  ?  ? ?  ? ? Follow-up Information   ? ? Bjorn Loser, MD. Call.   ?Specialty: Urology ?Why: foley removal / wound check in 1-2 weeks ?Contact information: ?[address] ?[place] [address] ?[phone] ? ? ?  ?  ? ? [name], MD Follow up.   ?Specialty: Family Medicine ?Contact information: ?[address] ?[address] ?[place] [address] ?[phone] ? ? ?  ?  ? ? [facility] Follow up.   ?Why: As needed, If symptoms worsen ?Contact  information: ?[address] ?[address] ?[place] [address] ?[phone] ? ? ?  ?  ? ?  ?  ? ?  ? ?Allergies  ?Allergen Reactions  ? Sulfa Antibiotics Rash  ? ? ? ?Procedures/Studies: ?DG Chest 1 View ? ?Result Date: 3/1

## 2021-04-06 NOTE — Progress Notes (Signed)
Patient discharge teaching provided to patient and daughter at bedside.  Home health nurse given report just before discharge.   ?Belongings sent with patient - ipad, clothes, items dressing change, and leg bag, foley bag.   ?Patient taken to front entrance in wheelchair where daughter provided transport.    ?

## 2021-04-06 NOTE — TOC Transition Note (Signed)
Transition of Care (TOC) - CM/SW Discharge Note ? ? ?Patient Details  ?Name: Jesus Kim ?MRN: [id number] ?Date of Birth: [birth year] ? ?Transition of Care (TOC) CM/SW Contact:  ?Tawanna Cooler, RN ?Phone Number: ?04/06/2021, 9:29 AM ? ? ?Clinical Narrative:    ? ?Confirmed with Tommi Rumps at [facility] that patient is discharging today and will be on [facility] service with them. Patient's family had previously declined any DME.   ?TOC signing off.  ? ?  ?Barriers to Discharge: Barriers Resolved ? ? ?Patient Goals and CMS Choice ?Patient states their goals for this hospitalization and ongoing recovery are:: return home ?  ?  ?

## 2021-04-08 DIAGNOSIS — I1310 Hypertensive heart and chronic kidney disease without heart failure, with stage 1 through stage 4 chronic kidney disease, or unspecified chronic kidney disease: Secondary | ICD-10-CM | POA: Diagnosis not present

## 2021-04-08 DIAGNOSIS — Z466 Encounter for fitting and adjustment of urinary device: Secondary | ICD-10-CM | POA: Diagnosis not present

## 2021-04-08 DIAGNOSIS — N390 Urinary tract infection, site not specified: Secondary | ICD-10-CM | POA: Diagnosis not present

## 2021-04-08 DIAGNOSIS — K2100 Gastro-esophageal reflux disease with esophagitis, without bleeding: Secondary | ICD-10-CM | POA: Diagnosis not present

## 2021-04-08 DIAGNOSIS — M47812 Spondylosis without myelopathy or radiculopathy, cervical region: Secondary | ICD-10-CM | POA: Diagnosis not present

## 2021-04-08 DIAGNOSIS — K2101 Gastro-esophageal reflux disease with esophagitis, with bleeding: Secondary | ICD-10-CM | POA: Diagnosis not present

## 2021-04-08 DIAGNOSIS — K2970 Gastritis, unspecified, without bleeding: Secondary | ICD-10-CM | POA: Diagnosis not present

## 2021-04-08 DIAGNOSIS — K259 Gastric ulcer, unspecified as acute or chronic, without hemorrhage or perforation: Secondary | ICD-10-CM | POA: Diagnosis not present

## 2021-04-08 DIAGNOSIS — I509 Heart failure, unspecified: Secondary | ICD-10-CM | POA: Diagnosis not present

## 2021-04-08 DIAGNOSIS — K610 Anal abscess: Secondary | ICD-10-CM | POA: Diagnosis not present

## 2021-04-08 DIAGNOSIS — N184 Chronic kidney disease, stage 4 (severe): Secondary | ICD-10-CM | POA: Diagnosis not present

## 2021-04-08 DIAGNOSIS — N492 Inflammatory disorders of scrotum: Secondary | ICD-10-CM | POA: Diagnosis not present

## 2021-04-08 DIAGNOSIS — M1990 Unspecified osteoarthritis, unspecified site: Secondary | ICD-10-CM | POA: Diagnosis not present

## 2021-04-08 DIAGNOSIS — J9601 Acute respiratory failure with hypoxia: Secondary | ICD-10-CM | POA: Diagnosis not present

## 2021-04-08 DIAGNOSIS — E871 Hypo-osmolality and hyponatremia: Secondary | ICD-10-CM | POA: Diagnosis not present

## 2021-04-08 DIAGNOSIS — M5031 Other cervical disc degeneration,  high cervical region: Secondary | ICD-10-CM | POA: Diagnosis not present

## 2021-04-08 DIAGNOSIS — J9811 Atelectasis: Secondary | ICD-10-CM | POA: Diagnosis not present

## 2021-04-08 DIAGNOSIS — M4313 Spondylolisthesis, cervicothoracic region: Secondary | ICD-10-CM | POA: Diagnosis not present

## 2021-04-08 DIAGNOSIS — I700 Atherosclerosis of aorta: Secondary | ICD-10-CM | POA: Diagnosis not present

## 2021-04-08 DIAGNOSIS — A419 Sepsis, unspecified organism: Secondary | ICD-10-CM | POA: Diagnosis not present

## 2021-04-08 DIAGNOSIS — I130 Hypertensive heart and chronic kidney disease with heart failure and stage 1 through stage 4 chronic kidney disease, or unspecified chronic kidney disease: Secondary | ICD-10-CM | POA: Diagnosis not present

## 2021-04-08 DIAGNOSIS — D62 Acute posthemorrhagic anemia: Secondary | ICD-10-CM | POA: Diagnosis not present

## 2021-04-08 DIAGNOSIS — K429 Umbilical hernia without obstruction or gangrene: Secondary | ICD-10-CM | POA: Diagnosis not present

## 2021-04-08 DIAGNOSIS — K649 Unspecified hemorrhoids: Secondary | ICD-10-CM | POA: Diagnosis not present

## 2021-04-08 DIAGNOSIS — N1330 Unspecified hydronephrosis: Secondary | ICD-10-CM | POA: Diagnosis not present

## 2021-04-08 DIAGNOSIS — K449 Diaphragmatic hernia without obstruction or gangrene: Secondary | ICD-10-CM | POA: Diagnosis not present

## 2021-04-09 LAB — AEROBIC/ANAEROBIC CULTURE W GRAM STAIN (SURGICAL/DEEP WOUND)

## 2021-04-11 DIAGNOSIS — I10 Essential (primary) hypertension: Secondary | ICD-10-CM | POA: Diagnosis not present

## 2021-04-11 DIAGNOSIS — N184 Chronic kidney disease, stage 4 (severe): Secondary | ICD-10-CM | POA: Diagnosis not present

## 2021-04-11 DIAGNOSIS — L02215 Cutaneous abscess of perineum: Secondary | ICD-10-CM | POA: Diagnosis not present

## 2021-04-11 DIAGNOSIS — Z09 Encounter for follow-up examination after completed treatment for conditions other than malignant neoplasm: Secondary | ICD-10-CM | POA: Diagnosis not present

## 2021-04-14 DIAGNOSIS — M4313 Spondylolisthesis, cervicothoracic region: Secondary | ICD-10-CM | POA: Diagnosis not present

## 2021-04-14 DIAGNOSIS — A419 Sepsis, unspecified organism: Secondary | ICD-10-CM | POA: Diagnosis not present

## 2021-04-14 DIAGNOSIS — N1330 Unspecified hydronephrosis: Secondary | ICD-10-CM | POA: Diagnosis not present

## 2021-04-14 DIAGNOSIS — Z466 Encounter for fitting and adjustment of urinary device: Secondary | ICD-10-CM | POA: Diagnosis not present

## 2021-04-14 DIAGNOSIS — N390 Urinary tract infection, site not specified: Secondary | ICD-10-CM | POA: Diagnosis not present

## 2021-04-14 DIAGNOSIS — I130 Hypertensive heart and chronic kidney disease with heart failure and stage 1 through stage 4 chronic kidney disease, or unspecified chronic kidney disease: Secondary | ICD-10-CM | POA: Diagnosis not present

## 2021-04-14 DIAGNOSIS — K259 Gastric ulcer, unspecified as acute or chronic, without hemorrhage or perforation: Secondary | ICD-10-CM | POA: Diagnosis not present

## 2021-04-14 DIAGNOSIS — K649 Unspecified hemorrhoids: Secondary | ICD-10-CM | POA: Diagnosis not present

## 2021-04-14 DIAGNOSIS — K2970 Gastritis, unspecified, without bleeding: Secondary | ICD-10-CM | POA: Diagnosis not present

## 2021-04-14 DIAGNOSIS — K2100 Gastro-esophageal reflux disease with esophagitis, without bleeding: Secondary | ICD-10-CM | POA: Diagnosis not present

## 2021-04-14 DIAGNOSIS — N184 Chronic kidney disease, stage 4 (severe): Secondary | ICD-10-CM | POA: Diagnosis not present

## 2021-04-14 DIAGNOSIS — J9811 Atelectasis: Secondary | ICD-10-CM | POA: Diagnosis not present

## 2021-04-14 DIAGNOSIS — I509 Heart failure, unspecified: Secondary | ICD-10-CM | POA: Diagnosis not present

## 2021-04-14 DIAGNOSIS — K449 Diaphragmatic hernia without obstruction or gangrene: Secondary | ICD-10-CM | POA: Diagnosis not present

## 2021-04-14 DIAGNOSIS — K610 Anal abscess: Secondary | ICD-10-CM | POA: Diagnosis not present

## 2021-04-14 DIAGNOSIS — M1990 Unspecified osteoarthritis, unspecified site: Secondary | ICD-10-CM | POA: Diagnosis not present

## 2021-04-14 DIAGNOSIS — K429 Umbilical hernia without obstruction or gangrene: Secondary | ICD-10-CM | POA: Diagnosis not present

## 2021-04-14 DIAGNOSIS — M47812 Spondylosis without myelopathy or radiculopathy, cervical region: Secondary | ICD-10-CM | POA: Diagnosis not present

## 2021-04-14 DIAGNOSIS — M5031 Other cervical disc degeneration,  high cervical region: Secondary | ICD-10-CM | POA: Diagnosis not present

## 2021-04-14 DIAGNOSIS — N492 Inflammatory disorders of scrotum: Secondary | ICD-10-CM | POA: Diagnosis not present

## 2021-04-14 DIAGNOSIS — E871 Hypo-osmolality and hyponatremia: Secondary | ICD-10-CM | POA: Diagnosis not present

## 2021-04-14 DIAGNOSIS — J9601 Acute respiratory failure with hypoxia: Secondary | ICD-10-CM | POA: Diagnosis not present

## 2021-04-14 DIAGNOSIS — I700 Atherosclerosis of aorta: Secondary | ICD-10-CM | POA: Diagnosis not present

## 2021-04-14 DIAGNOSIS — D62 Acute posthemorrhagic anemia: Secondary | ICD-10-CM | POA: Diagnosis not present

## 2021-04-17 DIAGNOSIS — T8189XA Other complications of procedures, not elsewhere classified, initial encounter: Secondary | ICD-10-CM | POA: Diagnosis not present

## 2021-04-22 DIAGNOSIS — N492 Inflammatory disorders of scrotum: Secondary | ICD-10-CM | POA: Diagnosis not present

## 2021-04-22 DIAGNOSIS — N130 Hydronephrosis with ureteropelvic junction obstruction: Secondary | ICD-10-CM | POA: Diagnosis not present

## 2021-04-28 DIAGNOSIS — N492 Inflammatory disorders of scrotum: Secondary | ICD-10-CM | POA: Diagnosis not present

## 2021-04-29 DIAGNOSIS — D62 Acute posthemorrhagic anemia: Secondary | ICD-10-CM | POA: Diagnosis not present

## 2021-04-29 DIAGNOSIS — J9811 Atelectasis: Secondary | ICD-10-CM | POA: Diagnosis not present

## 2021-04-29 DIAGNOSIS — M4313 Spondylolisthesis, cervicothoracic region: Secondary | ICD-10-CM | POA: Diagnosis not present

## 2021-04-29 DIAGNOSIS — I509 Heart failure, unspecified: Secondary | ICD-10-CM | POA: Diagnosis not present

## 2021-04-29 DIAGNOSIS — N390 Urinary tract infection, site not specified: Secondary | ICD-10-CM | POA: Diagnosis not present

## 2021-04-29 DIAGNOSIS — I700 Atherosclerosis of aorta: Secondary | ICD-10-CM | POA: Diagnosis not present

## 2021-04-29 DIAGNOSIS — K610 Anal abscess: Secondary | ICD-10-CM | POA: Diagnosis not present

## 2021-04-29 DIAGNOSIS — M1990 Unspecified osteoarthritis, unspecified site: Secondary | ICD-10-CM | POA: Diagnosis not present

## 2021-04-29 DIAGNOSIS — M47812 Spondylosis without myelopathy or radiculopathy, cervical region: Secondary | ICD-10-CM | POA: Diagnosis not present

## 2021-04-29 DIAGNOSIS — K2100 Gastro-esophageal reflux disease with esophagitis, without bleeding: Secondary | ICD-10-CM | POA: Diagnosis not present

## 2021-04-29 DIAGNOSIS — M5031 Other cervical disc degeneration,  high cervical region: Secondary | ICD-10-CM | POA: Diagnosis not present

## 2021-04-29 DIAGNOSIS — A419 Sepsis, unspecified organism: Secondary | ICD-10-CM | POA: Diagnosis not present

## 2021-04-29 DIAGNOSIS — K449 Diaphragmatic hernia without obstruction or gangrene: Secondary | ICD-10-CM | POA: Diagnosis not present

## 2021-04-29 DIAGNOSIS — K649 Unspecified hemorrhoids: Secondary | ICD-10-CM | POA: Diagnosis not present

## 2021-04-29 DIAGNOSIS — N492 Inflammatory disorders of scrotum: Secondary | ICD-10-CM | POA: Diagnosis not present

## 2021-04-29 DIAGNOSIS — J9601 Acute respiratory failure with hypoxia: Secondary | ICD-10-CM | POA: Diagnosis not present

## 2021-04-29 DIAGNOSIS — K429 Umbilical hernia without obstruction or gangrene: Secondary | ICD-10-CM | POA: Diagnosis not present

## 2021-04-29 DIAGNOSIS — E871 Hypo-osmolality and hyponatremia: Secondary | ICD-10-CM | POA: Diagnosis not present

## 2021-04-29 DIAGNOSIS — K2970 Gastritis, unspecified, without bleeding: Secondary | ICD-10-CM | POA: Diagnosis not present

## 2021-04-29 DIAGNOSIS — N1330 Unspecified hydronephrosis: Secondary | ICD-10-CM | POA: Diagnosis not present

## 2021-04-29 DIAGNOSIS — Z466 Encounter for fitting and adjustment of urinary device: Secondary | ICD-10-CM | POA: Diagnosis not present

## 2021-04-29 DIAGNOSIS — I130 Hypertensive heart and chronic kidney disease with heart failure and stage 1 through stage 4 chronic kidney disease, or unspecified chronic kidney disease: Secondary | ICD-10-CM | POA: Diagnosis not present

## 2021-04-29 DIAGNOSIS — K259 Gastric ulcer, unspecified as acute or chronic, without hemorrhage or perforation: Secondary | ICD-10-CM | POA: Diagnosis not present

## 2021-04-29 DIAGNOSIS — N184 Chronic kidney disease, stage 4 (severe): Secondary | ICD-10-CM | POA: Diagnosis not present

## 2021-04-30 DIAGNOSIS — L02215 Cutaneous abscess of perineum: Secondary | ICD-10-CM | POA: Diagnosis not present

## 2021-04-30 DIAGNOSIS — N184 Chronic kidney disease, stage 4 (severe): Secondary | ICD-10-CM | POA: Diagnosis not present

## 2021-04-30 DIAGNOSIS — R413 Other amnesia: Secondary | ICD-10-CM | POA: Diagnosis not present

## 2021-05-02 DIAGNOSIS — T8189XA Other complications of procedures, not elsewhere classified, initial encounter: Secondary | ICD-10-CM | POA: Diagnosis not present

## 2021-05-06 DIAGNOSIS — N492 Inflammatory disorders of scrotum: Secondary | ICD-10-CM | POA: Diagnosis not present

## 2021-05-12 DIAGNOSIS — K259 Gastric ulcer, unspecified as acute or chronic, without hemorrhage or perforation: Secondary | ICD-10-CM | POA: Diagnosis not present

## 2021-05-12 DIAGNOSIS — K2970 Gastritis, unspecified, without bleeding: Secondary | ICD-10-CM | POA: Diagnosis not present

## 2021-05-12 DIAGNOSIS — Z466 Encounter for fitting and adjustment of urinary device: Secondary | ICD-10-CM | POA: Diagnosis not present

## 2021-05-12 DIAGNOSIS — K649 Unspecified hemorrhoids: Secondary | ICD-10-CM | POA: Diagnosis not present

## 2021-05-12 DIAGNOSIS — J9601 Acute respiratory failure with hypoxia: Secondary | ICD-10-CM | POA: Diagnosis not present

## 2021-05-12 DIAGNOSIS — K449 Diaphragmatic hernia without obstruction or gangrene: Secondary | ICD-10-CM | POA: Diagnosis not present

## 2021-05-12 DIAGNOSIS — I700 Atherosclerosis of aorta: Secondary | ICD-10-CM | POA: Diagnosis not present

## 2021-05-12 DIAGNOSIS — D62 Acute posthemorrhagic anemia: Secondary | ICD-10-CM | POA: Diagnosis not present

## 2021-05-12 DIAGNOSIS — K2100 Gastro-esophageal reflux disease with esophagitis, without bleeding: Secondary | ICD-10-CM | POA: Diagnosis not present

## 2021-05-12 DIAGNOSIS — M5031 Other cervical disc degeneration,  high cervical region: Secondary | ICD-10-CM | POA: Diagnosis not present

## 2021-05-12 DIAGNOSIS — J9811 Atelectasis: Secondary | ICD-10-CM | POA: Diagnosis not present

## 2021-05-12 DIAGNOSIS — M1990 Unspecified osteoarthritis, unspecified site: Secondary | ICD-10-CM | POA: Diagnosis not present

## 2021-05-12 DIAGNOSIS — N1330 Unspecified hydronephrosis: Secondary | ICD-10-CM | POA: Diagnosis not present

## 2021-05-12 DIAGNOSIS — N184 Chronic kidney disease, stage 4 (severe): Secondary | ICD-10-CM | POA: Diagnosis not present

## 2021-05-12 DIAGNOSIS — I130 Hypertensive heart and chronic kidney disease with heart failure and stage 1 through stage 4 chronic kidney disease, or unspecified chronic kidney disease: Secondary | ICD-10-CM | POA: Diagnosis not present

## 2021-05-12 DIAGNOSIS — N390 Urinary tract infection, site not specified: Secondary | ICD-10-CM | POA: Diagnosis not present

## 2021-05-12 DIAGNOSIS — M4313 Spondylolisthesis, cervicothoracic region: Secondary | ICD-10-CM | POA: Diagnosis not present

## 2021-05-12 DIAGNOSIS — A419 Sepsis, unspecified organism: Secondary | ICD-10-CM | POA: Diagnosis not present

## 2021-05-12 DIAGNOSIS — I509 Heart failure, unspecified: Secondary | ICD-10-CM | POA: Diagnosis not present

## 2021-05-12 DIAGNOSIS — M47812 Spondylosis without myelopathy or radiculopathy, cervical region: Secondary | ICD-10-CM | POA: Diagnosis not present

## 2021-05-12 DIAGNOSIS — N492 Inflammatory disorders of scrotum: Secondary | ICD-10-CM | POA: Diagnosis not present

## 2021-05-12 DIAGNOSIS — K610 Anal abscess: Secondary | ICD-10-CM | POA: Diagnosis not present

## 2021-05-12 DIAGNOSIS — E871 Hypo-osmolality and hyponatremia: Secondary | ICD-10-CM | POA: Diagnosis not present

## 2021-05-12 DIAGNOSIS — K429 Umbilical hernia without obstruction or gangrene: Secondary | ICD-10-CM | POA: Diagnosis not present

## 2021-05-29 ENCOUNTER — Encounter: Payer: Self-pay | Admitting: Neurology

## 2021-05-29 ENCOUNTER — Ambulatory Visit: Payer: PPO | Admitting: Neurology

## 2021-05-29 VITALS — BP 145/82 | HR 88 | Ht 66.0 in | Wt 161.0 lb

## 2021-05-29 DIAGNOSIS — G3184 Mild cognitive impairment, so stated: Secondary | ICD-10-CM | POA: Diagnosis not present

## 2021-05-29 MED ORDER — DONEPEZIL HCL 10 MG PO TABS: 10.0000 mg | ORAL_TABLET | Freq: Every day | ORAL | 3 refills | Status: DC

## 2021-05-29 NOTE — Progress Notes (Signed)
[facility]  PATIENT: Jesus Kim DOB: [birth year]  REQUESTING CLINICIAN: [name], MD HISTORY FROM: Patient and daughter  REASON FOR VISIT: Memory problems    HISTORICAL  CHIEF COMPLAINT:  Chief Complaint  Patient presents with   New Patient (Initial Visit)    Rm 13. Accompanied by daughter, Jesus Kim. NP/[facility] [employer]/[facility] MD /short term memory decline.    HISTORY OF PRESENT ILLNESS:  This is a 88 year old gentleman past medical history of hypertension, hyperlipidemia, BPH who is presenting with problem with memory.  Patient describes the trouble with memory as short-term memory dysfunction, and tendency to repeat himself.  He is forgetful also about recent conversation.  Symptom has been going on for the past year but worse after his recent hospitalization in March.  In March he was admitted for UTI which was complicated by GI bleed, urinary retention and since discharge from the hospital on he has worsening of his memory.   Daughter who is present today also mention that patient had a tendency to repeat himself.  He will say the same thing over and over.  He does not ask the same question over and over but will repeat himself.  Other than that his getting better since being home, he still independent, helps with his wife, help with cleaning, does not need any help with bathing and toileting, he was driving prior to his hospitalization but since being discharged, he has not driven a car.  Denies being lost in familiar places while driving.  Denies having trouble using familiar tools around the house and reports occasional word finding difficulty but does not have trouble with names.  Wife is handling the bills, she always did.     TBI:   No past history of TBI Stroke:   no past history of stroke Seizures:   no past history of seizures Sleep:   no history of sleep apnea.   Mood:   patient denies anxiety and depression  Functional  status: independent in all  ADLs and IADLs Patient lives with wife  Cooking: wife  Cleaning: help  Shopping: with bath  Bathing: patient  Toileting: patient  Driving: Not driving yet  Bills: Wife  Ever left the stove on by accident?: No  Forget how to use items around the house?: No  Getting lost going to familiar places?: No  Forgetting loved ones names?: No Word finding difficulty? No Sleep: Good, will get about 8 hrs    OTHER MEDICAL CONDITIONS: Hypertension, hyperlipidemia, BPH   REVIEW OF SYSTEMS: Full 14 system review of systems performed and negative with exception of: As noted in the HPI  ALLERGIES: Allergies  Allergen Reactions   Sulfa Antibiotics Rash    HOME MEDICATIONS: Outpatient Medications Prior to Visit  Medication Sig Dispense Refill   acetaminophen (TYLENOL) 650 MG CR tablet Take 650 mg by mouth every 8 (eight) hours as needed for pain.     amLODipine (NORVASC) 2.5 MG tablet Take 2.5 mg by mouth 2 (two) times daily.     finasteride (PROSCAR) 5 MG tablet Take 5 mg by mouth daily.     gabapentin (NEURONTIN) 100 MG capsule Take 2 capsules (200 mg total) by mouth at bedtime. 180 capsule 3   Multiple Vitamins-Minerals (PRESERVISION AREDS 2+MULTI VIT PO) Take 2 tablets by mouth daily.     pantoprazole (PROTONIX) 40 MG tablet Take 1 tablet (40 mg total) by mouth 2 (two) times daily before a meal. 60 tablet 1  simvastatin (ZOCOR) 20 MG tablet Take 20 mg by mouth daily.     No facility-administered medications prior to visit.    PAST MEDICAL HISTORY: Past Medical History:  Diagnosis Date   Arthritis    Cancer ([place])    Colon   Hyperlipidemia    Hypertension    Melanoma ([place])     PAST SURGICAL HISTORY: Past Surgical History:  Procedure Laterality Date   BALLOON DILATION N/A 04/02/2015   Procedure: BALLOON DILATION;  Surgeon: Bjorn Loser, MD;  Location: [facility];  Service: Urology;  Laterality: N/A;   BIOPSY  03/24/2021   Procedure: BIOPSY;   Surgeon: Otis Brace, MD;  Location: [facility];  Service: Gastroenterology;;   CATARACT EXTRACTION     [place] - colon cancer   CYSTOSCOPY WITH RETROGRADE PYELOGRAM, URETEROSCOPY AND STENT PLACEMENT N/A 04/02/2015   Procedure: CYSTO WITH RETROGRADE ;  Surgeon: Bjorn Loser, MD;  Location: [facility];  Service: Urology;  Laterality: N/A;   CYSTOSCOPY WITH URETHRAL DILATATION N/A 04/04/2021   Procedure: CYSTOSCOPY WITH FOLEY CATHETER EXCHANGE;  Surgeon: Festus Aloe, MD;  Location: [facility];  Service: Urology;  Laterality: N/A;   ESOPHAGOGASTRODUODENOSCOPY (EGD) WITH PROPOFOL N/A 03/24/2021   Procedure: ESOPHAGOGASTRODUODENOSCOPY (EGD) WITH PROPOFOL;  Surgeon: Otis Brace, MD;  Location: [facility];  Service: Gastroenterology;  Laterality: N/A;   INCISION AND DRAINAGE ABSCESS N/A 04/04/2021   Procedure: INCISION AND DRAINAGE ABSCESS;  Surgeon: Festus Aloe, MD;  Location: [facility];  Service: Urology;  Laterality: N/A;   Left thub surgery     TONSILLECTOMY     URETHRAL STRICTURE DILATATION      FAMILY HISTORY: History reviewed. No pertinent family history.  SOCIAL HISTORY: Social History   Socioeconomic History   Marital status: Married    Spouse name: Not on file   Number of children: 3   Years of education: 14   Highest education level: Not on file  Occupational History   Not on file  Tobacco Use   Smoking status: Former    Types: Cigarettes    Quit date: 01/31/1977    Years since quitting: 44.3   Smokeless tobacco: Never  Vaping Use   Vaping Use: Never used  Substance and Sexual Activity   Alcohol use: Yes    Alcohol/week: 1.0 standard drink    Types: 1 Glasses of wine per week    Comment: occasionally   Drug use: No   Sexual activity: Not on file  Other Topics Concern   Not on file  Social History Narrative   Not on file   Social Determinants of Health   Financial Resource Strain: Not on file  Food Insecurity: Not on file   Transportation Needs: Not on file  Physical Activity: Not on file  Stress: Not on file  Social Connections: Not on file  Intimate Partner Violence: Not on file    PHYSICAL EXAM  GENERAL EXAM/CONSTITUTIONAL: Vitals:  Vitals:   05/29/21 1043  BP: (!) 145/82  Pulse: 88  Weight: 161 lb (73 kg)  Height: '5\' 6"'$  (1.676 m)   Body mass index is 25.99 kg/m. Wt Readings from Last 3 Encounters:  05/29/21 161 lb (73 kg)  04/05/21 154 lb 12.2 oz (70.2 kg)  09/10/20 160 lb (72.6 kg)   Patient is in no distress; well developed, nourished and groomed; neck is supple  EYES: Pupils round and reactive to light, Visual fields full to confrontation, Extraocular movements intacts,   MUSCULOSKELETAL: Gait, strength, tone,  movements noted in Neurologic exam below  NEUROLOGIC: MENTAL STATUS:      View : No data to display.            05/29/2021   10:45 AM  Montreal Cognitive Assessment   Visuospatial/ Executive (0/5) 3  Naming (0/3) 3  Attention: Read list of digits (0/2) 2  Attention: Read list of letters (0/1) 1  Attention: Serial 7 subtraction starting at 100 (0/3) 3  Language: Repeat phrase (0/2) 1  Language : Fluency (0/1) 1  Abstraction (0/2) 2  Delayed Recall (0/5) 0  Orientation (0/6) 2  Total 18    CRANIAL NERVE:  2nd, 3rd, 4th, 6th - pupils equal and reactive to light, visual fields full to confrontation, extraocular muscles intact, no nystagmus 5th - facial sensation symmetric 7th - facial strength symmetric 8th - hearing intact 9th - palate elevates symmetrically, uvula midline 11th - shoulder shrug symmetric 12th - tongue protrusion midline  MOTOR:  normal bulk and tone, full strength in the BUE, BLE  SENSORY:  normal and symmetric to light touch, vibration  COORDINATION:  finger-nose-finger, fine finger movements normal  REFLEXES:  deep tendon reflexes present and symmetric  GAIT/STATION:  normal   DIAGNOSTIC DATA (LABS, IMAGING, TESTING) - I  reviewed patient records, labs, notes, testing and imaging myself where available.  Lab Results  Component Value Date   WBC 9.4 04/06/2021   HGB 9.2 (L) 04/06/2021   HCT 29.0 (L) 04/06/2021   MCV 90.6 04/06/2021   PLT 286 04/06/2021      Component Value Date/Time   NA 139 04/06/2021 0338   K 3.9 04/06/2021 0338   CL 110 04/06/2021 0338   CO2 22 04/06/2021 0338   GLUCOSE 100 (H) 04/06/2021 0338   BUN 24 (H) 04/06/2021 0338   CREATININE 2.54 (H) 04/06/2021 0338   CALCIUM 7.4 (L) 04/06/2021 0338   PROT 5.3 (L) 04/06/2021 0338   ALBUMIN 2.0 (L) 04/06/2021 0338   AST 28 04/06/2021 0338   ALT 57 (H) 04/06/2021 0338   ALKPHOS 60 04/06/2021 0338   BILITOT 0.3 04/06/2021 0338   GFRNONAA 23 (L) 04/06/2021 0338   GFRAA 23 (L) 03/01/2017 1346   No results found for: CHOL, HDL, LDLCALC, LDLDIRECT, TRIG, CHOLHDL Lab Results  Component Value Date   HGBA1C 5.5 03/27/2021   No results found for: [id number] Lab Results  Component Value Date   TSH 1.277 11/15/2019    Head CT 03/19/21 1. No acute intracranial abnormality. No skull fracture. 2. Normal for age brain atrophy and chronic small vessel ischemia.    ASSESSMENT AND PLAN  88 y.o. year old male with hypertension, hyperlipidemia and BPH who is presenting with memory deficit described as problem with short-term memory and repeating himself. The patient is still independent, able to do all activities of daily living.  However today on exam he scored 18 out of 30 on the [place], consistent with cognitive impairment.  Daughter reports since being discharged from the hospital he is improving.  We discussed starting Aricept for memory, discussed side effects and patient and daughter would like to start.  Advised him to start Aricept 10 mg, half tablet nightly for 1 month then increase to full tablet.  If they do have side effect of dizziness or diarrhea to contact me.  Since he was driving before his recent hospitalization and he continued to  improve, and since passing the [employer] vision exam, I think it is reasonable for patient to resume driving, only  locally, and to familiar places.  I will see him in 1 year for follow-up    1. Mild cognitive impairment     Patient Instructions  Start with Aricept 10 mg, 1/2 tablet nightly for one month then increase to full tablet if no side effect  Continue your other medications  Return in 1 year    There are well-accepted and sensible ways to reduce risk for Alzheimers disease and other degenerative brain disorders .  Exercise Daily Walk A daily 20 minute walk should be part of your routine. Disease related apathy can be a significant roadblock to exercise and the only way to overcome this is to make it a daily routine and perhaps have a reward at the end (something your loved one loves to eat or drink perhaps) or a personal trainer coming to the home can also be very useful. Most importantly, the patient is much more likely to exercise if the caregiver / spouse does it with him/her. In general a structured, repetitive schedule is best.  General Health: Any diseases which effect your body will effect your brain such as a pneumonia, urinary infection, blood clot, heart attack or stroke. Keep contact with your primary care doctor for regular follow ups.  Sleep. A good nights sleep is healthy for the brain. Seven hours is recommended. If you have insomnia or poor sleep habits we can give you some instructions. If you have sleep apnea wear your mask.  Diet: Eating a heart healthy diet is also a good idea; fish and poultry instead of red meat, nuts (mostly non-peanuts), vegetables, fruits, olive oil or canola oil (instead of butter), minimal salt (use other spices to flavor foods), whole grain rice, bread, cereal and pasta and wine in moderation.Research is now showing that the MIND diet, which is a combination of The Mediterranean diet and the DASH diet, is beneficial for cognitive processing and  longevity. Information about this diet can be found in The MIND Diet, a book by Doyne Keel, MS, RDN, and online at [employer].si  Finances, Power of Attorney and Advance Directives: You should consider putting legal safeguards in place with regard to financial and medical decision making. While the spouse always has power of attorney for medical and financial issues in the absence of any form, you should consider what you want in case the spouse / caregiver is no longer around or capable of making decisions.   No orders of the defined types were placed in this encounter.   Meds ordered this encounter  Medications   donepezil (ARICEPT) 10 MG tablet    Sig: Take 1 tablet (10 mg total) by mouth at bedtime.    Dispense:  30 tablet    Refill:  3    Return in about 1 year (around 05/30/2022).  I have spent a total of 65 minutes dedicated to this patient today, preparing to see patient, performing a medically appropriate examination and evaluation, ordering tests and/or medications and procedures, and counseling and educating the patient/family/caregiver; independently interpreting result and communicating results to the family/patient/caregiver; and documenting clinical information in the electronic medical record.   Alric Ran, MD 05/29/2021, 5:10 PM  [facility] [address] [place], [place] [address] [phone]

## 2021-05-29 NOTE — Patient Instructions (Addendum)
Start with Aricept 10 mg, 1/2 tablet nightly for one month then increase to full tablet if no side effect  Continue your other medications  Return in 1 year    There are well-accepted and sensible ways to reduce risk for Alzheimers disease and other degenerative brain disorders .  Exercise Daily Walk A daily 20 minute walk should be part of your routine. Disease related apathy can be a significant roadblock to exercise and the only way to overcome this is to make it a daily routine and perhaps have a reward at the end (something your loved one loves to eat or drink perhaps) or a personal trainer coming to the home can also be very useful. Most importantly, the patient is much more likely to exercise if the caregiver / spouse does it with him/her. In general a structured, repetitive schedule is best.  General Health: Any diseases which effect your body will effect your brain such as a pneumonia, urinary infection, blood clot, heart attack or stroke. Keep contact with your primary care doctor for regular follow ups.  Sleep. A good nights sleep is healthy for the brain. Seven hours is recommended. If you have insomnia or poor sleep habits we can give you some instructions. If you have sleep apnea wear your mask.  Diet: Eating a heart healthy diet is also a good idea; fish and poultry instead of red meat, nuts (mostly non-peanuts), vegetables, fruits, olive oil or canola oil (instead of butter), minimal salt (use other spices to flavor foods), whole grain rice, bread, cereal and pasta and wine in moderation.Research is now showing that the MIND diet, which is a combination of The Mediterranean diet and the DASH diet, is beneficial for cognitive processing and longevity. Information about this diet can be found in The MIND Diet, a book by Doyne Keel, MS, RDN, and online at [employer].si  Finances, Power of Attorney and Advance Directives: You should consider putting legal  safeguards in place with regard to financial and medical decision making. While the spouse always has power of attorney for medical and financial issues in the absence of any form, you should consider what you want in case the spouse / caregiver is no longer around or capable of making decisions.

## 2021-06-19 DIAGNOSIS — N130 Hydronephrosis with ureteropelvic junction obstruction: Secondary | ICD-10-CM | POA: Diagnosis not present

## 2021-06-19 DIAGNOSIS — N201 Calculus of ureter: Secondary | ICD-10-CM | POA: Diagnosis not present

## 2021-06-19 DIAGNOSIS — R350 Frequency of micturition: Secondary | ICD-10-CM | POA: Diagnosis not present

## 2021-07-18 DIAGNOSIS — D485 Neoplasm of uncertain behavior of skin: Secondary | ICD-10-CM | POA: Diagnosis not present

## 2021-07-18 DIAGNOSIS — L0889 Other specified local infections of the skin and subcutaneous tissue: Secondary | ICD-10-CM | POA: Diagnosis not present

## 2021-07-18 DIAGNOSIS — Z85828 Personal history of other malignant neoplasm of skin: Secondary | ICD-10-CM | POA: Diagnosis not present

## 2021-07-18 DIAGNOSIS — Z85820 Personal history of malignant melanoma of skin: Secondary | ICD-10-CM | POA: Diagnosis not present

## 2021-07-18 DIAGNOSIS — L905 Scar conditions and fibrosis of skin: Secondary | ICD-10-CM | POA: Diagnosis not present

## 2021-07-18 DIAGNOSIS — Z79899 Other long term (current) drug therapy: Secondary | ICD-10-CM | POA: Diagnosis not present

## 2021-07-18 DIAGNOSIS — L011 Impetiginization of other dermatoses: Secondary | ICD-10-CM | POA: Diagnosis not present

## 2021-07-18 DIAGNOSIS — C44229 Squamous cell carcinoma of skin of left ear and external auricular canal: Secondary | ICD-10-CM | POA: Diagnosis not present

## 2021-07-26 ENCOUNTER — Encounter: Payer: Self-pay | Admitting: Neurology

## 2021-07-28 DIAGNOSIS — C44229 Squamous cell carcinoma of skin of left ear and external auricular canal: Secondary | ICD-10-CM | POA: Diagnosis not present

## 2021-08-05 DIAGNOSIS — C44229 Squamous cell carcinoma of skin of left ear and external auricular canal: Secondary | ICD-10-CM | POA: Diagnosis not present

## 2021-08-06 ENCOUNTER — Encounter: Payer: Self-pay | Admitting: Neurology

## 2021-08-14 ENCOUNTER — Other Ambulatory Visit: Payer: Self-pay | Admitting: Otolaryngology

## 2021-08-28 ENCOUNTER — Encounter (HOSPITAL_COMMUNITY): Payer: Self-pay | Admitting: Otolaryngology

## 2021-08-28 ENCOUNTER — Other Ambulatory Visit: Payer: Self-pay

## 2021-08-28 NOTE — Progress Notes (Signed)
Jesus. Jesus Kim denies chest pain or shortness of breath. Patient denies having any s/s of Covid in his household, also denies any known exposure to Covid.   PCP is Dr. Hebert Soho, with [facility], I requested last office visit, EKG and labs.  Jesus Kim Creatine was elevated > 2 in March, patient reports that he does not see a nephrologist.  Jesus Jarvis, PA- C reviewed the chart.

## 2021-08-29 ENCOUNTER — Ambulatory Visit (HOSPITAL_COMMUNITY)
Admission: RE | Admit: 2021-08-29 | Discharge: 2021-08-29 | Disposition: A | Discharge status: Home / Self Care | Payer: PPO | Attending: Otolaryngology | Admitting: Otolaryngology

## 2021-08-29 ENCOUNTER — Other Ambulatory Visit (HOSPITAL_COMMUNITY): Payer: Self-pay

## 2021-08-29 ENCOUNTER — Ambulatory Visit (HOSPITAL_BASED_OUTPATIENT_CLINIC_OR_DEPARTMENT_OTHER): Payer: PPO | Admitting: Anesthesiology

## 2021-08-29 ENCOUNTER — Encounter (HOSPITAL_COMMUNITY)
Admission: RE | Disposition: A | Discharge status: Home / Self Care | Payer: Self-pay | Source: Home / Self Care | Attending: Otolaryngology

## 2021-08-29 ENCOUNTER — Other Ambulatory Visit: Payer: Self-pay

## 2021-08-29 ENCOUNTER — Encounter (HOSPITAL_COMMUNITY): Payer: Self-pay | Admitting: Otolaryngology

## 2021-08-29 ENCOUNTER — Ambulatory Visit (HOSPITAL_COMMUNITY): Payer: PPO | Admitting: Anesthesiology

## 2021-08-29 DIAGNOSIS — C44229 Squamous cell carcinoma of skin of left ear and external auricular canal: Secondary | ICD-10-CM

## 2021-08-29 DIAGNOSIS — I129 Hypertensive chronic kidney disease with stage 1 through stage 4 chronic kidney disease, or unspecified chronic kidney disease: Secondary | ICD-10-CM | POA: Diagnosis not present

## 2021-08-29 DIAGNOSIS — Z87891 Personal history of nicotine dependence: Secondary | ICD-10-CM | POA: Insufficient documentation

## 2021-08-29 DIAGNOSIS — Z85828 Personal history of other malignant neoplasm of skin: Secondary | ICD-10-CM | POA: Insufficient documentation

## 2021-08-29 DIAGNOSIS — N189 Chronic kidney disease, unspecified: Secondary | ICD-10-CM | POA: Insufficient documentation

## 2021-08-29 DIAGNOSIS — C44209 Unspecified malignant neoplasm of skin of left ear and external auricular canal: Secondary | ICD-10-CM | POA: Diagnosis not present

## 2021-08-29 HISTORY — DX: Chronic kidney disease, unspecified: N18.9

## 2021-08-29 HISTORY — PX: LESION EXCISION WITH COMPLEX REPAIR: SHX6700

## 2021-08-29 LAB — BASIC METABOLIC PANEL
Anion gap: 6 (ref 5–15)
BUN: 32 mg/dL — ABNORMAL HIGH (ref 8–23)
CO2: 18 mmol/L — ABNORMAL LOW (ref 22–32)
Calcium: 8.5 mg/dL — ABNORMAL LOW (ref 8.9–10.3)
Chloride: 109 mmol/L (ref 98–111)
Creatinine, Ser: 2.83 mg/dL — ABNORMAL HIGH (ref 0.61–1.24)
GFR, Estimated: 20 mL/min — ABNORMAL LOW (ref >60)
Glucose, Bld: 116 mg/dL — ABNORMAL HIGH (ref 70–99)
Potassium: 4 mmol/L (ref 3.5–5.1)
Sodium: 133 mmol/L — ABNORMAL LOW (ref 135–145)

## 2021-08-29 LAB — CBC
HCT: 39.4 % (ref 39.0–52.0)
Hemoglobin: 12.8 g/dL — ABNORMAL LOW (ref 13.0–17.0)
MCH: 30.4 pg (ref 26.0–34.0)
MCHC: 32.5 g/dL (ref 30.0–36.0)
MCV: 93.6 fL (ref 80.0–100.0)
Platelets: 224 10*3/uL (ref 150–400)
RBC: 4.21 MIL/uL — ABNORMAL LOW (ref 4.22–5.81)
RDW: 13.7 % (ref 11.5–15.5)
WBC: 8.2 10*3/uL (ref 4.0–10.5)
nRBC: 0 % (ref 0.0–0.2)

## 2021-08-29 SURGERY — LESION EXCISION WITH COMPLEX REPAIR
Anesthesia: General | Site: Ear | Laterality: Left

## 2021-08-29 MED ORDER — CEFAZOLIN SODIUM-DEXTROSE 2-4 GM/100ML-% IV SOLN
2.0000 g | INTRAVENOUS | Status: AC
  Administered 2021-08-29: 2 g via INTRAVENOUS

## 2021-08-29 MED ORDER — PROPOFOL 10 MG/ML IV BOLUS
INTRAVENOUS | Status: AC
  Filled 2021-08-29: qty 20

## 2021-08-29 MED ORDER — LIDOCAINE-EPINEPHRINE 1 %-1:100000 IJ SOLN
INTRAMUSCULAR | Status: AC
  Filled 2021-08-29: qty 1

## 2021-08-29 MED ORDER — PHENYLEPHRINE 80 MCG/ML (10ML) SYRINGE FOR IV PUSH (FOR BLOOD PRESSURE SUPPORT)
PREFILLED_SYRINGE | INTRAVENOUS | Status: AC
  Filled 2021-08-29: qty 10

## 2021-08-29 MED ORDER — ONDANSETRON HCL 4 MG/2ML IJ SOLN
INTRAMUSCULAR | Status: AC
  Filled 2021-08-29: qty 2

## 2021-08-29 MED ORDER — LEVOFLOXACIN 250 MG PO TABS
250.0000 mg | ORAL_TABLET | Freq: Two times a day (BID) | ORAL | 0 refills | Status: AC
  Filled 2021-08-29: qty 14, 7d supply, fill #0

## 2021-08-29 MED ORDER — LIDOCAINE 2% (20 MG/ML) 5 ML SYRINGE
INTRAMUSCULAR | Status: AC
  Filled 2021-08-29: qty 5

## 2021-08-29 MED ORDER — FENTANYL CITRATE (PF) 250 MCG/5ML IJ SOLN
INTRAMUSCULAR | Status: DC | PRN
  Administered 2021-08-29: 50 ug via INTRAVENOUS
  Administered 2021-08-29: 20 ug via INTRAVENOUS

## 2021-08-29 MED ORDER — CHLORHEXIDINE GLUCONATE 0.12 % MT SOLN: 15.0000 mL | Freq: Once | OROMUCOSAL | Status: AC

## 2021-08-29 MED ORDER — LIDOCAINE-EPINEPHRINE 1 %-1:100000 IJ SOLN
INTRAMUSCULAR | Status: DC | PRN
  Administered 2021-08-29: 16 mL

## 2021-08-29 MED ORDER — ONDANSETRON HCL 4 MG/2ML IJ SOLN
INTRAMUSCULAR | Status: DC | PRN
  Administered 2021-08-29: 4 mg via INTRAVENOUS

## 2021-08-29 MED ORDER — PHENYLEPHRINE HCL-NACL 20-0.9 MG/250ML-% IV SOLN
INTRAVENOUS | Status: DC | PRN
  Administered 2021-08-29: 30 ug/min via INTRAVENOUS

## 2021-08-29 MED ORDER — LACTATED RINGERS IV SOLN: INTRAVENOUS | Status: DC

## 2021-08-29 MED ORDER — ACETAMINOPHEN-CODEINE 300-30 MG PO TABS
1.0000 | ORAL_TABLET | Freq: Four times a day (QID) | ORAL | 0 refills | Status: AC | PRN
  Filled 2021-08-29: qty 20, 5d supply, fill #0

## 2021-08-29 MED ORDER — DOCUSATE SODIUM 100 MG PO CAPS
100.0000 mg | ORAL_CAPSULE | Freq: Two times a day (BID) | ORAL | 0 refills | Status: AC | PRN
  Filled 2021-08-29: qty 20, 10d supply, fill #0

## 2021-08-29 MED ORDER — ACETAMINOPHEN 500 MG PO TABS
ORAL_TABLET | ORAL | Status: AC
  Administered 2021-08-29: 1000 mg via ORAL
  Filled 2021-08-29: qty 2

## 2021-08-29 MED ORDER — BACITRACIN ZINC 500 UNIT/GM EX OINT
TOPICAL_OINTMENT | CUTANEOUS | Status: AC
Start: 2021-08-29 — End: ?
  Filled 2021-08-29: qty 28.35

## 2021-08-29 MED ORDER — CHLORHEXIDINE GLUCONATE 0.12 % MT SOLN
OROMUCOSAL | Status: AC
  Administered 2021-08-29: 15 mL via OROMUCOSAL
  Filled 2021-08-29: qty 15

## 2021-08-29 MED ORDER — CEFAZOLIN SODIUM-DEXTROSE 2-4 GM/100ML-% IV SOLN
INTRAVENOUS | Status: AC
  Filled 2021-08-29: qty 100

## 2021-08-29 MED ORDER — PHENYLEPHRINE HCL (PRESSORS) 10 MG/ML IV SOLN
INTRAVENOUS | Status: DC | PRN
  Administered 2021-08-29: 160 ug via INTRAVENOUS
  Administered 2021-08-29 (×3): 80 ug via INTRAVENOUS
  Administered 2021-08-29: 160 ug via INTRAVENOUS
  Administered 2021-08-29 (×2): 80 ug via INTRAVENOUS

## 2021-08-29 MED ORDER — 0.9 % SODIUM CHLORIDE (POUR BTL) OPTIME
TOPICAL | Status: DC | PRN
  Administered 2021-08-29: 1000 mL

## 2021-08-29 MED ORDER — ROCURONIUM BROMIDE 10 MG/ML (PF) SYRINGE
PREFILLED_SYRINGE | INTRAVENOUS | Status: AC
  Filled 2021-08-29: qty 10

## 2021-08-29 MED ORDER — PROPOFOL 10 MG/ML IV BOLUS
INTRAVENOUS | Status: DC | PRN
  Administered 2021-08-29: 40 mg via INTRAVENOUS
  Administered 2021-08-29: 50 mg via INTRAVENOUS

## 2021-08-29 MED ORDER — ORAL CARE MOUTH RINSE: 15.0000 mL | Freq: Once | OROMUCOSAL | Status: AC

## 2021-08-29 MED ORDER — ACETAMINOPHEN-CODEINE 300-30 MG PO TABS: 1.0000 | ORAL_TABLET | Freq: Four times a day (QID) | ORAL | 0 refills | Status: DC | PRN

## 2021-08-29 MED ORDER — LIDOCAINE 2% (20 MG/ML) 5 ML SYRINGE
INTRAMUSCULAR | Status: DC | PRN
  Administered 2021-08-29: 50 mg via INTRAVENOUS

## 2021-08-29 MED ORDER — BACITRACIN ZINC 500 UNIT/GM EX OINT
TOPICAL_OINTMENT | CUTANEOUS | Status: DC | PRN
  Administered 2021-08-29: 1 via TOPICAL

## 2021-08-29 MED ORDER — ACETAMINOPHEN 500 MG PO TABS: 1000.0000 mg | ORAL_TABLET | Freq: Once | ORAL | Status: AC

## 2021-08-29 MED ORDER — FENTANYL CITRATE (PF) 250 MCG/5ML IJ SOLN
INTRAMUSCULAR | Status: AC
  Filled 2021-08-29: qty 5

## 2021-08-29 SURGICAL SUPPLY — 44 items
BAG COUNTER SPONGE SURGICOUNT (BAG) ×1 IMPLANT
BAG SPNG CNTER NS LX DISP (BAG) ×1
BLADE SURG 15 STRL LF DISP TIS (BLADE) IMPLANT
BLADE SURG 15 STRL SS (BLADE)
CANISTER SUCT 3000ML PPV (MISCELLANEOUS) IMPLANT
CLEANER TIP ELECTROSURG 2X2 (MISCELLANEOUS) ×1 IMPLANT
CNTNR URN SCR LID CUP LEK RST (MISCELLANEOUS) ×2 IMPLANT
CONT SPEC 4OZ STRL OR WHT (MISCELLANEOUS) ×1
CORD BIPOLAR FORCEPS 12FT (ELECTRODE) IMPLANT
COVER SURGICAL LIGHT HANDLE (MISCELLANEOUS) ×1 IMPLANT
DRAIN PENROSE 1/4X12 LTX STRL (WOUND CARE) IMPLANT
DRAPE HALF SHEET 40X57 (DRAPES) IMPLANT
DRSG EMULSION OIL 3X3 NADH (GAUZE/BANDAGES/DRESSINGS) IMPLANT
DRSG GLASSCOCK MASTOID ADT (GAUZE/BANDAGES/DRESSINGS) IMPLANT
ELECT COATED BLADE 2.86 ST (ELECTRODE) ×1 IMPLANT
ELECT NDL TIP 2.8 STRL (NEEDLE) IMPLANT
ELECT NEEDLE TIP 2.8 STRL (NEEDLE) IMPLANT
ELECT REM PT RETURN 9FT ADLT (ELECTROSURGICAL) ×1
ELECTRODE REM PT RTRN 9FT ADLT (ELECTROSURGICAL) ×1 IMPLANT
FORCEPS BIPOLAR SPETZLER 8 1.0 (NEUROSURGERY SUPPLIES) IMPLANT
GAUZE SPONGE 4X4 12PLY STRL (GAUZE/BANDAGES/DRESSINGS) IMPLANT
GLOVE BIO SURGEON STRL SZ 6.5 (GLOVE) ×2 IMPLANT
GOWN STRL REUS W/ TWL LRG LVL3 (GOWN DISPOSABLE) ×2 IMPLANT
GOWN STRL REUS W/TWL LRG LVL3 (GOWN DISPOSABLE) ×2
KIT BASIN OR (CUSTOM PROCEDURE TRAY) ×2 IMPLANT
KIT TURNOVER KIT B (KITS) IMPLANT
NDL HYPO 25GX1X1/2 BEV (NEEDLE) IMPLANT
NEEDLE HYPO 25GX1X1/2 BEV (NEEDLE) IMPLANT
NS IRRIG 1000ML POUR BTL (IV SOLUTION) ×1 IMPLANT
PAD ARMBOARD 7.5X6 YLW CONV (MISCELLANEOUS) ×2 IMPLANT
PENCIL SMOKE EVACUATOR (MISCELLANEOUS) ×1 IMPLANT
POSITIONER HEAD DONUT 9IN (MISCELLANEOUS) IMPLANT
SUT CHROMIC 4 0 P 3 18 (SUTURE) IMPLANT
SUT ETHILON 4 0 PS 2 18 (SUTURE) IMPLANT
SUT ETHILON 5 0 P 3 18 (SUTURE) ×2
SUT NYLON ETHILON 5-0 P-3 1X18 (SUTURE) IMPLANT
SUT PDS 5 0 RB 1 (SUTURE) IMPLANT
SUT SILK 4 0 (SUTURE)
SUT SILK 4-0 18XBRD TIE 12 (SUTURE) IMPLANT
SWAB COLLECTION DEVICE MRSA (MISCELLANEOUS) IMPLANT
SWAB CULTURE ESWAB REG 1ML (MISCELLANEOUS) IMPLANT
SYR BULB IRRIG 60ML STRL (SYRINGE) IMPLANT
SYR TB 1ML LUER SLIP (SYRINGE) IMPLANT
TRAY ENT MC OR (CUSTOM PROCEDURE TRAY) ×2 IMPLANT

## 2021-08-29 NOTE — Transfer of Care (Signed)
Immediate Anesthesia Transfer of Care Note  Patient: Jesus Kim  Procedure(s) Performed: EXCISION OF LEFT EAR SQUAMOUS CELL CARCINOMA (Left: Ear)  Patient Location: PACU  Anesthesia Type:General  Level of Consciousness: awake, alert  and oriented  Airway & Oxygen Therapy: Patient Spontanous Breathing and Patient connected to nasal cannula oxygen  Post-op Assessment: Report given to RN, Post -op Vital signs reviewed and stable, Patient moving all extremities X 4 and Patient able to stick tongue midline  Post vital signs: Reviewed  Last Vitals:  Vitals Value Taken Time  BP 151/75 08/29/21 1040  Temp 97.6   Pulse 75 08/29/21 1041  Resp 16 08/29/21 1041  SpO2 97 % 08/29/21 1041  Vitals shown include unvalidated device data.  Last Pain:  Vitals:   08/29/21 0633  TempSrc:   PainSc: 0-No pain         Complications: No notable events documented.

## 2021-08-29 NOTE — Brief Op Note (Signed)
08/29/2021  10:37 AM  PATIENT:  Jesus Kim  88 y.o. male  PRE-OPERATIVE DIAGNOSIS:  Squamous cell carcinoma, ear, left  POST-OPERATIVE DIAGNOSIS:  Squamous cell carcinoma, ear, left  PROCEDURE:  Procedure(s): EXCISION OF LEFT EAR SQUAMOUS CELL CARCINOMA (Left)  SURGEON:  Surgeon(s) and Role:    * Lamir Racca A, DO - Primary  PHYSICIAN ASSISTANT:   ASSISTANTS: none   ANESTHESIA:   local and general  EBL:  10 mL   BLOOD ADMINISTERED:none  DRAINS: none   LOCAL MEDICATIONS USED:  LIDOCAINE   SPECIMEN:  Excision  DISPOSITION OF SPECIMEN:  PATHOLOGY  COUNTS:  YES  TOURNIQUET:  * No tourniquets in log *  DICTATION: .Note written in EPIC  PLAN OF CARE: Discharge to home after PACU  PATIENT DISPOSITION:  PACU - hemodynamically stable.   Delay start of Pharmacological VTE agent (>24hrs) due to surgical blood loss or risk of bleeding: not applicable

## 2021-08-29 NOTE — Anesthesia Procedure Notes (Signed)
Procedure Name: LMA Insertion Date/Time: 08/29/2021 7:44 AM  Performed by: Maude Leriche, CRNAPre-anesthesia Checklist: Patient identified, Emergency Drugs available, Suction available, Patient being monitored and Timeout performed Patient Re-evaluated:Patient Re-evaluated prior to induction Oxygen Delivery Method: Circle system utilized Preoxygenation: Pre-oxygenation with 100% oxygen Induction Type: IV induction LMA: LMA inserted LMA Size: 5.0 Number of attempts: 1 Placement Confirmation: positive ETCO2 and breath sounds checked- equal and bilateral Tube secured with: Tape Dental Injury: Teeth and Oropharynx as per pre-operative assessment

## 2021-08-29 NOTE — Discharge Instructions (Signed)
[facility] Post Operative Instructions  Effects of Anesthesia  Ear surgery involves a brief anesthesia, typically 1-2 hours. Patients may be quite irritable for several hours after surgery. If sedatives were given, some patients will remain sleepy for much of the day. Nausea and vomiting are occasionally seen, and usually resolve by the evening of surgery - even without additional medications.  POST-OPERATIVE INSTRUCTIONS   Take it easy the first two(2) weeks after surgery. You can bathe from the neck down 24 hours after surgery, but DO NOT get water on the incision for at least 48 hours.  If you have any of the following please let us know by calling our office between the hours of 8:30 am and 5:00 pm: Acute pain or discomfort, yellow or green drainage, or drainage with a foul odor. For emergencies after hours or on weekends, call the main office number to notify the physician on call.  You can wear the firm dressing at night to avoid injury to the ear. Wear this for the first 5 nights after surgery. Bloody drainage from the incision is normal and can be gently cleaned with diluted peroxide.  Avoid lifting over 20 lbs after surgery.  No bending from waist or stooping over  IT IS OK TO TAKE OVER THE COUNTER PAIN MEDICATION (IBUPROFEN, NAPROXEN, or ACETAMINOPHEN) IN ADDITION TO YOUR PRESCRIBED MEDICATIONS.  DO NOT TAKE ASPIRIN UNLESS CLEARED WITH YOUR SURGEON.  Limit Acetaminophen/Tylenol to less than 4,'000mg'$ /day   Limit Ibuprofen/Motrin to less than 3,'600mg'$ /day

## 2021-08-29 NOTE — H&P (Signed)
Jesus Kim is an 88 y.o. male.    Chief Complaint:  Left ear squamous cell carcinoma  HPI: Patient presents today for planned elective procedure.  He denies any interval change in history since office visit on 08/05/2021:  Jesus Kim is a 88 y.o. 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 Patient underwent shave biopsy on 07/18/2021 with dermatology, and pathology was consistent with well-differentiated squamous cell carcinoma, with peripheral and deep margins involved. Patient has extensive skin cancer history, with history of basal cell carcinoma on the left forehead, left occipital scalp, and right occipital scalp which were treated with excision, as well as melanoma in situ of the right medial upper back and left inferior central occipital scalp which were treated with cryotherapy and imiquimod, as well as squamous cell carcinoma in situ on the right superior lateral leg, treated with excision. He is not on any blood thinning medication. He notes that the shave biopsy site is exquisitely tender to palpation. Patient does wear glasses to drive as well as bilateral hearing aids.  Past Medical History:  Diagnosis Date   Arthritis    Cancer ([place])    Colon   Chronic kidney disease (CKD)    Hyperlipidemia    Hypertension    Melanoma ([place])     Past Surgical History:  Procedure Laterality Date   BALLOON DILATION N/A 04/02/2015   Procedure: BALLOON DILATION;  Surgeon: Bjorn Loser, MD;  Location: [facility];  Service: Urology;  Laterality: N/A;   BIOPSY  03/24/2021   Procedure: BIOPSY;  Surgeon: Otis Brace, MD;  Location: [facility];  Service: Gastroenterology;;   CATARACT EXTRACTION     Blunt - colon cancer   CYSTOSCOPY WITH RETROGRADE PYELOGRAM, URETEROSCOPY AND STENT PLACEMENT N/A 04/02/2015   Procedure: CYSTO WITH RETROGRADE ;  Surgeon: Bjorn Loser, MD;  Location: [facility];  Service: Urology;  Laterality: N/A;   CYSTOSCOPY WITH URETHRAL DILATATION N/A 04/04/2021   Procedure: CYSTOSCOPY WITH FOLEY CATHETER EXCHANGE;  Surgeon: Festus Aloe, MD;  Location: [facility];  Service: Urology;  Laterality: N/A;   ESOPHAGOGASTRODUODENOSCOPY (EGD) WITH PROPOFOL N/A 03/24/2021   Procedure: ESOPHAGOGASTRODUODENOSCOPY (EGD) WITH PROPOFOL;  Surgeon: Otis Brace, MD;  Location: [facility];  Service: Gastroenterology;  Laterality: N/A;   INCISION AND DRAINAGE ABSCESS N/A 04/04/2021   Procedure: INCISION AND DRAINAGE ABSCESS;  Surgeon: Festus Aloe, MD;  Location: [facility];  Service: Urology;  Laterality: N/A;   Left thub surgery     TONSILLECTOMY     URETHRAL STRICTURE DILATATION      History reviewed. No pertinent family history.  Social History:  reports that he quit smoking about 44 years ago. His smoking use included cigarettes. He has never used smokeless tobacco. He reports current alcohol use of about 2.0 standard drinks of alcohol per week. He reports that he does not use drugs.  Allergies:  Allergies  Allergen Reactions   Sulfa Antibiotics Rash    Medications Prior to Admission  Medication Sig Dispense Refill   amLODipine (NORVASC) 2.5 MG tablet Take 2.5 mg by mouth 2 (two) times daily.     donepezil (ARICEPT) 10 MG tablet Take 1 tablet (10 mg total) by mouth at bedtime. (Patient taking differently: Take 5 mg by mouth at bedtime.) 30 tablet 3   finasteride (PROSCAR) 5 MG tablet Take 5 mg by mouth in the morning.  Multiple Vitamins-Minerals (PRESERVISION AREDS 2) CAPS Take 1 capsule by mouth in the morning and at bedtime.     nitrofurantoin, macrocrystal-monohydrate, (MACROBID) 100 MG capsule Take 100 mg by mouth daily.     simvastatin (ZOCOR) 20 MG tablet Take 20 mg by mouth every evening.     gabapentin (NEURONTIN) 100 MG capsule Take 2 capsules (200 mg total) by mouth at bedtime. (Patient not taking: Reported on  08/27/2021) 180 capsule 3   pantoprazole (PROTONIX) 40 MG tablet Take 1 tablet (40 mg total) by mouth 2 (two) times daily before a meal. (Patient not taking: Reported on 08/27/2021) 60 tablet 1    Results for orders placed or performed during the hospital encounter of 08/29/21 (from the past 48 hour(s))  Basic metabolic panel per protocol     Status: Abnormal   Collection Time: 08/29/21  6:06 AM  Result Value Ref Range   Sodium 133 (L) 135 - 145 mmol/L   Potassium 4.0 3.5 - 5.1 mmol/L   Chloride 109 98 - 111 mmol/L   CO2 18 (L) 22 - 32 mmol/L   Glucose, Bld 116 (H) 70 - 99 mg/dL    Comment: Glucose reference range applies only to samples taken after fasting for at least 8 hours.   BUN 32 (H) 8 - 23 mg/dL   Creatinine, Ser 2.83 (H) 0.61 - 1.24 mg/dL   Calcium 8.5 (L) 8.9 - 10.3 mg/dL   GFR, Estimated 20 (L) >60 mL/min    Comment: (NOTE) Calculated using the CKD-EPI Creatinine Equation (2021)    Anion gap 6 5 - 15    Comment: Performed at [facility] [address], [place], [place] [address]  CBC per protocol     Status: Abnormal   Collection Time: 08/29/21  6:06 AM  Result Value Ref Range   WBC 8.2 4.0 - 10.5 K/uL   RBC 4.21 (L) 4.22 - 5.81 MIL/uL   Hemoglobin 12.8 (L) 13.0 - 17.0 g/dL   HCT 39.4 39.0 - 52.0 %   MCV 93.6 80.0 - 100.0 fL   MCH 30.4 26.0 - 34.0 pg   MCHC 32.5 30.0 - 36.0 g/dL   RDW 13.7 11.5 - 15.5 %   Platelets 224 150 - 400 K/uL   nRBC 0.0 0.0 - 0.2 %    Comment: Performed at [facility], [place] [address], [place], [place] [address]   No results found.  ROS: ROS  Blood pressure (!) 142/83, pulse 88, temperature 97.7 F (36.5 C), temperature source Oral, resp. rate 18, height '5\' 6"'$  (1.676 m), weight 68 kg, SpO2 96 %.  PHYSICAL EXAM: Physical Exam Constitutional:      Appearance: Normal appearance.  HENT:     Ears:     Comments: SCC of left helix    Nose: Nose normal.     Mouth/Throat:     Mouth: Mucous membranes are moist.  Pulmonary:      Effort: Pulmonary effort is normal.  Musculoskeletal:     Cervical back: Neck supple.  Neurological:     Mental Status: He is alert. Mental status is at baseline.  Psychiatric:        Mood and Affect: Mood normal.        Behavior: Behavior normal.     Studies Reviewed: None   Assessment/Plan Beaux Wedemeyer is a 88 y.o. 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  -To  OR for wide local excision with intraoperative frozen section and reconstruction with local advancement or rotational flap and possible skin graft. Risks of surgery reviewed. All questions answered.    Yizel Canby A Aaliyan Brinkmeier 08/29/2021, 7:28 AM

## 2021-08-29 NOTE — Anesthesia Preprocedure Evaluation (Addendum)
Anesthesia Evaluation  Patient identified by MRN, date of birth, ID band Patient awake    Reviewed: Allergy & Precautions, NPO status , Patient's Chart, lab work & pertinent test results  Airway Mallampati: II  TM Distance: >3 FB Neck ROM: Full    Dental  (+) Dental Advisory Given   Pulmonary former smoker,    breath sounds clear to auscultation       Cardiovascular hypertension, Pt. on medications  Rhythm:Regular Rate:Normal     Neuro/Psych negative neurological ROS     GI/Hepatic negative GI ROS, Neg liver ROS,   Endo/Other  negative endocrine ROS  Renal/GU CRFRenal disease     Musculoskeletal  (+) Arthritis ,   Abdominal   Peds  Hematology negative hematology ROS (+)   Anesthesia Other Findings   Reproductive/Obstetrics                             Anesthesia Physical Anesthesia Plan  ASA: 3  Anesthesia Plan: General   Post-op Pain Management: Tylenol PO (pre-op)* and Minimal or no pain anticipated   Induction: Intravenous  PONV Risk Score and Plan: 1 and Dexamethasone, Ondansetron and Treatment may vary due to age or medical condition  Airway Management Planned: LMA  Additional Equipment: None  Intra-op Plan:   Post-operative Plan: Extubation in OR  Informed Consent: I have reviewed the patients History and Physical, chart, labs and discussed the procedure including the risks, benefits and alternatives for the proposed anesthesia with the patient or authorized representative who has indicated his/her understanding and acceptance.     Dental advisory given  Plan Discussed with: CRNA  Anesthesia Plan Comments:        Anesthesia Quick Evaluation

## 2021-08-29 NOTE — Op Note (Signed)
OPERATIVE NOTE  Jesus Kim Date/Time of Admission: 08/29/2021  5:16 AM  CSN: [id number];TLX:[id number] Attending Provider: Ebbie Latus A, DO Room/Bed: MCPO/NONE DOB: Dec 10, 1924 Age: 88 y.o.   Pre-Op Diagnosis: Squamous cell carcinoma, ear, left  Post-Op Diagnosis: Squamous cell carcinoma, ear, left  Procedure: Procedure(s): EXCISION OF LEFT EAR SQUAMOUS CELL CARCINOMA, Wide local excision measuring 4 x 3 cm (74163) with local advancement flap reconstruction (84536)  Anesthesia: General  Surgeon(s): [place], DO  Staff: Circulator: Rometta Emery, RN Relief Scrub: Rozell Searing, RN Scrub Person: Caswell Corwin T, RN  Implants: * No implants in log *  Specimens: ID Type Source Tests Collected by Time Destination  1 : Left auricular squamous cell carcinoma Tissue PATH ENT excision SURGICAL PATHOLOGY [name], [name], DO 08/29/2021 4680   2 : Left auricular deep margin Tissue PATH ENT excision SURGICAL PATHOLOGY [name], [name], DO 08/29/2021 3212   3 : Additional left auricular superior margin Tissue PATH ENT excision SURGICAL PATHOLOGY [name], [name], DO 08/29/2021 2482   4 : Additional left auricular superior margin (#2) Tissue PATH ENT excision SURGICAL PATHOLOGY [name], DO 08/29/2021 5003   5 : Additional left auricular superior margin (#3) Tissue PATH ENT excision SURGICAL PATHOLOGY [name], DO 07/16/8887 1694     Complications: None  EBL: 15 ML  Condition: stable  Operative Findings:  Squamous cell carcinoma of the posterior aspect of the superior left helix, grossly measuring 2 x 1.5cm.   Description of Operation: Once the operative consent was obtained and the site and surgery were confirmed with the patient and the operating room team, the patient was brought back to the operating room and general anesthesia with LMA was smoothly obtained. Local anesthesia, 1% lidocaine with 1:100,000 epinephrine was  injected into the subcutaneous tissue surrounding the known lesion. The patient was prepped and draped in sterile fashion and turned over to the ENT service. The skin malignancy was identified and wide local excision was planned, ensuring 5 mm margins circumferentially.  The lesion was sharply excised from the external ear, and was oriented appropriately for pathology.  The entire specimen was submitted as a frozen section, with evaluation performed to assess margins.  The superior margin was noted to be positive, and an additional 5 mm margin along the entire superior aspect was taken.  This margin again returned positive for squamous cell carcinoma and additional 3m margin was taken superiorly.  This superior margin was again noted to be positive, and ultimately an additional 1 cm was taken along the entire superior length of the incision.  This specimen returned negative for squamous cell carcinoma.  At this time, closure was planned using a modified Antia-Buch flap.  An incision was planned along the helical sulcus extending through the cartilage but not the posterior skin.  The posterior auricular skin was widely undermined until the entire helix was hanging as a chondrocutaneous flap on the posterior skin.  Due to significant retraction at the superior aspect of the ear, a V to Y advancement was planned of the helical crus to reduce tension on the closure.  Incision was sharply made with an 11 blade, and the cartilage was freed using an iris scissor.  Redundant cartilage was excised until the defect could be closed in a tension-free manner.  The cartilage and perichondrium were approximated using 5-0 PDS suture.  The skin was approximated using 5-0 nylon in a running fashion.  Bacitracin ointment was placed on the skin  and a mastoid dressing was placed.  The patient was turned over to the anesthesia service. The patient was transferred to the PACU in stable condition.    [name], [name]  ENT  08/29/2021

## 2021-09-01 ENCOUNTER — Encounter (HOSPITAL_COMMUNITY): Payer: Self-pay | Admitting: Otolaryngology

## 2021-09-01 LAB — SURGICAL PATHOLOGY

## 2021-09-01 NOTE — Anesthesia Postprocedure Evaluation (Signed)
Anesthesia Post Note  Patient: Jesus Kim  Procedure(s) Performed: EXCISION OF LEFT EAR SQUAMOUS CELL CARCINOMA (Left: Ear)     Patient location during evaluation: PACU Anesthesia Type: General Level of consciousness: awake and alert Pain management: pain level controlled Vital Signs Assessment: post-procedure vital signs reviewed and stable Respiratory status: spontaneous breathing, nonlabored ventilation, respiratory function stable and patient connected to nasal cannula oxygen Cardiovascular status: blood pressure returned to baseline and stable Postop Assessment: no apparent nausea or vomiting Anesthetic complications: no   No notable events documented.  Last Vitals:  Vitals:   08/29/21 1055 08/29/21 1110  BP: (!) 147/81 129/73  Pulse: 75 74  Resp: 14 20  Temp:  36.8 C  SpO2: 95% 96%    Last Pain:  Vitals:   08/29/21 1110  TempSrc:   PainSc: 0-No pain   Pain Goal:                   Tiajuana Amass

## 2021-10-15 DIAGNOSIS — R3914 Feeling of incomplete bladder emptying: Secondary | ICD-10-CM | POA: Diagnosis not present

## 2021-10-15 DIAGNOSIS — N130 Hydronephrosis with ureteropelvic junction obstruction: Secondary | ICD-10-CM | POA: Diagnosis not present

## 2021-10-16 DIAGNOSIS — Z85820 Personal history of malignant melanoma of skin: Secondary | ICD-10-CM | POA: Diagnosis not present

## 2021-10-16 DIAGNOSIS — Z85828 Personal history of other malignant neoplasm of skin: Secondary | ICD-10-CM | POA: Diagnosis not present

## 2021-10-16 DIAGNOSIS — L814 Other melanin hyperpigmentation: Secondary | ICD-10-CM | POA: Diagnosis not present

## 2021-10-16 DIAGNOSIS — D485 Neoplasm of uncertain behavior of skin: Secondary | ICD-10-CM | POA: Diagnosis not present

## 2021-10-20 ENCOUNTER — Telehealth: Payer: Self-pay | Admitting: *Deleted

## 2021-10-20 NOTE — Patient Outreach (Signed)
  Care Coordination   10/20/2021 Name: Jesus Kim MRN: [id number] DOB: May 18, 1924   Care Coordination Outreach Attempts:  An unsuccessful telephone outreach was attempted today to offer the patient information about available care coordination services as a benefit of their health plan.   Follow Up Plan:  Additional outreach attempts will be made to offer the patient care coordination information and services.   Encounter Outcome:  No Answer  Care Coordination Interventions Activated:  No   Care Coordination Interventions:  No, not indicated    Raina Mina, RN Care Management Coordinator [place] Office [phone]

## 2021-10-23 ENCOUNTER — Telehealth: Payer: Self-pay | Admitting: *Deleted

## 2021-10-23 ENCOUNTER — Encounter: Payer: Self-pay | Admitting: *Deleted

## 2021-10-23 NOTE — Patient Instructions (Signed)
Visit Information  Thank you for taking time to visit with me today. Please don't hesitate to contact me if I can be of assistance to you.   Following are the goals we discussed today:   Goals Addressed               This Visit's Progress     COMPLETED: No needs (pt-stated)        Care Coordination Interventions: Advised patient to contact his provider's office to schedule his AWV for this year Reviewed medications with patient and discussed adherence to medications with no needed refills Reviewed scheduled/upcoming provider appointments including pending appointments  Assessed social determinant of health barriers         Please call the care guide team at [phone] if you need to cancel or reschedule your appointment.   If you are experiencing a Mental Health or [place] or need someone to talk to, please call the Suicide and Crisis Lifeline: 988  Patient verbalizes understanding of instructions and care plan provided today and agrees to view in [place]. Active MyChart status and patient understanding of how to access instructions and care plan via MyChart confirmed with patient.     No further follow up required: No needs  Raina Mina, RN Care Management Coordinator [place] Office [phone]

## 2021-10-23 NOTE — Patient Outreach (Signed)
  Care Coordination   Initial Visit Note   10/23/2021 Name: Jesus Kim MRN: [id number] DOB: 1924-01-27  Jesus Kim is a 88 y.o. year old male who sees [name], MD for primary care. I spoke with  Fredderick Severance by phone today.  What matters to the patients health and wellness today?  No needs    Goals Addressed               This Visit's Progress     COMPLETED: No needs (pt-stated)        Care Coordination Interventions: Advised patient to contact his provider's office to schedule his AWV for this year Reviewed medications with patient and discussed adherence to medications with no needed refills Reviewed scheduled/upcoming provider appointments including pending appointments  Assessed social determinant of health barriers         SDOH assessments and interventions completed:  Yes  SDOH Interventions Today    Flowsheet Row Most Recent Value  SDOH Interventions   Food Insecurity Interventions Intervention Not Indicated  Housing Interventions Intervention Not Indicated  Transportation Interventions Intervention Not Indicated  Utilities Interventions Intervention Not Indicated        Care Coordination Interventions Activated:  Yes  Care Coordination Interventions:  Yes, provided   Follow up plan: No further intervention required.   Encounter Outcome:  Pt. Visit Completed   Raina Mina, RN Care Management Coordinator [place] Office [phone]

## 2021-11-06 DIAGNOSIS — I129 Hypertensive chronic kidney disease with stage 1 through stage 4 chronic kidney disease, or unspecified chronic kidney disease: Secondary | ICD-10-CM | POA: Diagnosis not present

## 2021-11-06 DIAGNOSIS — E785 Hyperlipidemia, unspecified: Secondary | ICD-10-CM | POA: Diagnosis not present

## 2021-11-06 DIAGNOSIS — Z0000 Encounter for general adult medical examination without abnormal findings: Secondary | ICD-10-CM | POA: Diagnosis not present

## 2021-11-06 DIAGNOSIS — R7303 Prediabetes: Secondary | ICD-10-CM | POA: Diagnosis not present

## 2021-11-06 DIAGNOSIS — I10 Essential (primary) hypertension: Secondary | ICD-10-CM | POA: Diagnosis not present

## 2021-11-06 DIAGNOSIS — R413 Other amnesia: Secondary | ICD-10-CM | POA: Diagnosis not present

## 2021-12-08 DIAGNOSIS — C44229 Squamous cell carcinoma of skin of left ear and external auricular canal: Secondary | ICD-10-CM | POA: Diagnosis not present

## 2021-12-10 ENCOUNTER — Telehealth: Payer: Self-pay | Admitting: Neurology

## 2021-12-10 NOTE — Telephone Encounter (Signed)
Pt daughter(janice) is calling. Stated she thinks pt needs a sooner appointment. Stated his memory loss is getting worse. Thayer Headings is requesting a call-back from nurse.

## 2021-12-11 ENCOUNTER — Encounter: Payer: Self-pay | Admitting: Neurology

## 2021-12-25 ENCOUNTER — Other Ambulatory Visit: Payer: Self-pay | Admitting: Neurology

## 2022-02-05 ENCOUNTER — Ambulatory Visit: Payer: PPO | Admitting: Neurology

## 2022-02-05 ENCOUNTER — Encounter: Payer: Self-pay | Admitting: Neurology

## 2022-02-05 VITALS — BP 146/75 | HR 81 | Ht 66.0 in | Wt 148.5 lb

## 2022-02-05 DIAGNOSIS — G300 Alzheimer's disease with early onset: Secondary | ICD-10-CM | POA: Diagnosis not present

## 2022-02-05 DIAGNOSIS — F02A0 Dementia in other diseases classified elsewhere, mild, without behavioral disturbance, psychotic disturbance, mood disturbance, and anxiety: Secondary | ICD-10-CM | POA: Diagnosis not present

## 2022-02-05 MED ORDER — DONEPEZIL HCL 10 MG PO TABS
10.0000 mg | ORAL_TABLET | Freq: Every day | ORAL | 3 refills | Status: AC
Start: 2022-02-05 — End: ?

## 2022-02-05 NOTE — Progress Notes (Signed)
[facility]  PATIENT: Jesus Kim DOB: 27-Oct-1924  REQUESTING CLINICIAN: [name], MD HISTORY FROM: Patient and daughter  REASON FOR VISIT: Memory problems    HISTORICAL  CHIEF COMPLAINT:  Chief Complaint  Patient presents with   Follow-up    Rm 13. Patient with wife and daughter in rm. Family is concerned about worsening memory and wondering if change in medication would help.    INTERVAL HISTORY 02/05/2022:  Patient presents today for follow-up, he is accompanied by wife and daughter.  Per patient he feels like his memory is the same.  He is compliant with the Aricept 10 mg nightly, denies any side effects.  Wife reported his memory is a little worse.  She can tell him 1 thing and then 5 minutes later he will forget.  He does rely on his wife to remind him daily activity.  He still somewhat independent, still drives to familiar places.  Daughter reports that he perseverates a lot about recent events.  His sleep is fine, he has not been agitated or having any other symptoms.   HISTORY OF PRESENT ILLNESS:  This is a 89 year old gentleman past medical history of hypertension, hyperlipidemia, BPH who is presenting with problem with memory.  Patient describes the trouble with memory as short-term memory dysfunction, and tendency to repeat himself.  He is forgetful also about recent conversation.  Symptom has been going on for the past year but worse after his recent hospitalization in March.  In March he was admitted for UTI which was complicated by GI bleed, urinary retention and since discharge from the hospital on he has worsening of his memory.   Daughter who is present today also mention that patient had a tendency to repeat himself.  He will say the same thing over and over.  He does not ask the same question over and over but will repeat himself.  Other than that his getting better since being home, he still independent, helps with his wife, help with  cleaning, does not need any help with bathing and toileting, he was driving prior to his hospitalization but since being discharged, he has not driven a car.  Denies being lost in familiar places while driving.  Denies having trouble using familiar tools around the house and reports occasional word finding difficulty but does not have trouble with names.  Wife is handling the bills, she always did.     TBI:   No past history of TBI Stroke:   no past history of stroke Seizures:   no past history of seizures Sleep:   no history of sleep apnea.   Mood:   patient denies anxiety and depression  Functional status: independent in all  ADLs and IADLs Patient lives with wife  Cooking: wife  Cleaning: help  Shopping: with bath  Bathing: patient  Toileting: patient  Driving: Not driving yet  Bills: Wife  Ever left the stove on by accident?: No  Forget how to use items around the house?: No  Getting lost going to familiar places?: No  Forgetting loved ones names?: No Word finding difficulty? No Sleep: Good, will get about 8 hrs    OTHER MEDICAL CONDITIONS: Hypertension, hyperlipidemia, BPH   REVIEW OF SYSTEMS: Full 14 system review of systems performed and negative with exception of: As noted in the HPI  ALLERGIES: Allergies  Allergen Reactions   Sulfa Antibiotics Rash    HOME MEDICATIONS: Outpatient Medications Prior to Visit  Medication Sig Dispense Refill  amLODipine (NORVASC) 2.5 MG tablet Take 2.5 mg by mouth 2 (two) times daily.     finasteride (PROSCAR) 5 MG tablet Take 5 mg by mouth in the morning.     Multiple Vitamins-Minerals (PRESERVISION AREDS 2) CAPS Take 1 capsule by mouth in the morning and at bedtime.     nitrofurantoin, macrocrystal-monohydrate, (MACROBID) 100 MG capsule Take 100 mg by mouth daily.     simvastatin (ZOCOR) 20 MG tablet Take 20 mg by mouth every evening.     donepezil (ARICEPT) 10 MG tablet TAKE 1 TABLET BY MOUTH AT BEDTIME 90 tablet 0   gabapentin  (NEURONTIN) 100 MG capsule Take 2 capsules (200 mg total) by mouth at bedtime. (Patient not taking: Reported on 08/27/2021) 180 capsule 3   pantoprazole (PROTONIX) 40 MG tablet Take 1 tablet (40 mg total) by mouth 2 (two) times daily before a meal. (Patient not taking: Reported on 08/27/2021) 60 tablet 1   No facility-administered medications prior to visit.    PAST MEDICAL HISTORY: Past Medical History:  Diagnosis Date   Arthritis    Cancer ([place])    Colon   Chronic kidney disease (CKD)    Hyperlipidemia    Hypertension    Melanoma ([place])     PAST SURGICAL HISTORY: Past Surgical History:  Procedure Laterality Date   BALLOON DILATION N/A 04/02/2015   Procedure: BALLOON DILATION;  Surgeon: Bjorn Loser, MD;  Location: [facility];  Service: Urology;  Laterality: N/A;   BIOPSY  03/24/2021   Procedure: BIOPSY;  Surgeon: Otis Brace, MD;  Location: [facility];  Service: Gastroenterology;;   CATARACT EXTRACTION     [place] - colon cancer   CYSTOSCOPY WITH RETROGRADE PYELOGRAM, URETEROSCOPY AND STENT PLACEMENT N/A 04/02/2015   Procedure: CYSTO WITH RETROGRADE ;  Surgeon: Bjorn Loser, MD;  Location: [facility];  Service: Urology;  Laterality: N/A;   CYSTOSCOPY WITH URETHRAL DILATATION N/A 04/04/2021   Procedure: CYSTOSCOPY WITH FOLEY CATHETER EXCHANGE;  Surgeon: Festus Aloe, MD;  Location: [facility];  Service: Urology;  Laterality: N/A;   ESOPHAGOGASTRODUODENOSCOPY (EGD) WITH PROPOFOL N/A 03/24/2021   Procedure: ESOPHAGOGASTRODUODENOSCOPY (EGD) WITH PROPOFOL;  Surgeon: Otis Brace, MD;  Location: [facility];  Service: Gastroenterology;  Laterality: N/A;   INCISION AND DRAINAGE ABSCESS N/A 04/04/2021   Procedure: INCISION AND DRAINAGE ABSCESS;  Surgeon: Festus Aloe, MD;  Location: [facility];  Service: Urology;  Laterality: N/A;   Left thub surgery     LESION EXCISION WITH COMPLEX REPAIR Left 08/29/2021   Procedure: EXCISION OF LEFT EAR SQUAMOUS CELL CARCINOMA;   Surgeon: Fredric Dine, Meghan A, DO;  Location: [place];  Service: ENT;  Laterality: Left;   TONSILLECTOMY     URETHRAL STRICTURE DILATATION      FAMILY HISTORY: History reviewed. No pertinent family history.  SOCIAL HISTORY: Social History   Socioeconomic History   Marital status: Married    Spouse name: Not on file   Number of children: 3   Years of education: 14   Highest education level: Not on file  Occupational History   Not on file  Tobacco Use   Smoking status: Former    Types: Cigarettes    Quit date: 01/31/1977    Years since quitting: 45.0   Smokeless tobacco: Never  Vaping Use   Vaping Use: Never used  Substance and Sexual Activity   Alcohol use: Yes    Alcohol/week: 2.0 standard drinks of alcohol    Types: 1 Glasses of wine, 1  Cans of beer per week    Comment: occasionally   Drug use: No   Sexual activity: Not on file  Other Topics Concern   Not on file  Social History Narrative   Not on file   Social Determinants of Health   Financial Resource Strain: Not on file  Food Insecurity: No Food Insecurity (10/23/2021)   Hunger Vital Sign    Worried About Running Out of Food in the Last Year: Never true    Ran Out of Food in the Last Year: Never true  Transportation Needs: No Transportation Needs (10/23/2021)   PRAPARE - Hydrologist (Medical): No    Lack of Transportation (Non-Medical): No  Physical Activity: Not on file  Stress: Not on file  Social Connections: Not on file  Intimate Partner Violence: Not on file    PHYSICAL EXAM  GENERAL EXAM/CONSTITUTIONAL: Vitals:  Vitals:   02/05/22 1109  BP: (!) 146/75  Pulse: 81  Weight: 148 lb 8 oz (67.4 kg)  Height: '5\' 6"'$  (1.676 m)    Body mass index is 23.97 kg/m. Wt Readings from Last 3 Encounters:  02/05/22 148 lb 8 oz (67.4 kg)  08/29/21 150 lb (68 kg)  05/29/21 161 lb (73 kg)   Patient is in no distress; well developed, nourished and groomed; neck is  supple  EYES: Visual fields full to confrontation, Extraocular movements intacts,   MUSCULOSKELETAL: Gait, strength, tone, movements noted in Neurologic exam below  NEUROLOGIC: MENTAL STATUS:      No data to display            02/05/2022   11:13 AM 05/29/2021   10:45 AM  Montreal Cognitive Assessment   Visuospatial/ Executive (0/5) 3 3  Naming (0/3) 2 3  Attention: Read list of digits (0/2) 2 2  Attention: Read list of letters (0/1) 0 1  Attention: Serial 7 subtraction starting at 100 (0/3) 3 3  Language: Repeat phrase (0/2) 1 1  Language : Fluency (0/1) 1 1  Abstraction (0/2) 1 2  Delayed Recall (0/5) 3 0  Orientation (0/6) 3 2  Total 19 18    CRANIAL NERVE:  2nd, 3rd, 4th, 6th - visual fields full to confrontation, extraocular muscles intact, no nystagmus 5th - facial sensation symmetric 7th - facial strength symmetric 8th - hearing intact 9th - palate elevates symmetrically, uvula midline 11th - shoulder shrug symmetric 12th - tongue protrusion midline  MOTOR:  normal bulk and tone, full strength in the BUE, BLE  SENSORY:  normal and symmetric to light touch, vibration  COORDINATION:  finger-nose-finger, fine finger movements normal  REFLEXES:  deep tendon reflexes present and symmetric  GAIT/STATION:  normal  DIAGNOSTIC DATA (LABS, IMAGING, TESTING) - I reviewed patient records, labs, notes, testing and imaging myself where available.  Lab Results  Component Value Date   WBC 8.2 08/29/2021   HGB 12.8 (L) 08/29/2021   HCT 39.4 08/29/2021   MCV 93.6 08/29/2021   PLT 224 08/29/2021      Component Value Date/Time   NA 133 (L) 08/29/2021 0606   K 4.0 08/29/2021 0606   CL 109 08/29/2021 0606   CO2 18 (L) 08/29/2021 0606   GLUCOSE 116 (H) 08/29/2021 0606   BUN 32 (H) 08/29/2021 0606   CREATININE 2.83 (H) 08/29/2021 0606   CALCIUM 8.5 (L) 08/29/2021 0606   PROT 5.3 (L) 04/06/2021 0338   ALBUMIN 2.0 (L) 04/06/2021 0338   AST 28 04/06/2021 0338  ALT 57 (H) 04/06/2021 0338   ALKPHOS 60 04/06/2021 0338   BILITOT 0.3 04/06/2021 0338   GFRNONAA 20 (L) 08/29/2021 0606   GFRAA 23 (L) 03/01/2017 1346   No results found for: "CHOL", "HDL", "LDLCALC", "LDLDIRECT", "TRIG", "CHOLHDL" Lab Results  Component Value Date   HGBA1C 5.5 03/27/2021   No results found for: "VITAMINB12" Lab Results  Component Value Date   TSH 1.277 11/15/2019    Head CT 03/19/21 1. No acute intracranial abnormality. No skull fracture. 2. Normal for age brain atrophy and chronic small vessel ischemia.   ASSESSMENT AND PLAN  89 y.o. year old male with hypertension, hyperlipidemia and BPH who is presenting for follow up for his memory deficit.  Per family, patient memory is getting worse.  He is on Aricept 10 mg nightly.  Patient likely has mild Alzheimer disease.  I will obtain ATN profile to look for Alzheimer biomarker.  I will contact the patient to go over the results.  We discussed ways to reduce the progression of the disease including exercise, keeping a good health, good diet and good sleep.  He voices understanding.  I will see him in 1 year for follow-up.    1. 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  Exercise Daily Walk A daily 20 minute walk should be part of your routine. Disease related apathy can be a significant roadblock to exercise and the only way to overcome this is to make it a daily routine and perhaps have a reward at the end (something your loved one loves to eat or drink perhaps) or a personal trainer coming to the home can also be very useful. Most importantly, the patient is much more likely to exercise if the  caregiver / spouse does it with him/her. In general a structured, repetitive schedule is best.  General Health: Any diseases which effect your body will effect your brain such as a pneumonia, urinary infection, blood clot, heart attack or stroke. Keep contact with your primary care doctor for regular follow ups.  Sleep. A good nights sleep is healthy for the brain. Seven hours is recommended. If you have insomnia or poor sleep habits we can give you some instructions. If you have sleep apnea wear your mask.  Diet: Eating a heart healthy diet is also a good idea; fish and poultry instead of red meat, nuts (mostly non-peanuts), vegetables, fruits, olive oil or canola oil (instead of butter), minimal salt (use other spices to flavor foods), whole grain rice, bread, cereal and pasta and wine in moderation.Research is now showing that the MIND diet, which is a combination of The Mediterranean diet and the DASH diet, is beneficial for cognitive processing and longevity. Information about this diet can be found in The MIND Diet, a book by Doyne Keel, MS, RDN, and online at [employer]bookDist[employer].si  Finances, Power of Attorney and Advance Directives: You should consider putting legal safeguards in place with regard to financial and medical decision making. While the spouse always has power of attorney for medical and financial issues in the absence of any form, you should consider what you want in case the spouse / caregiver is no longer around or  capable of making decisions.     Orders Placed This Encounter  Procedures   ATN PROFILE    Meds ordered this encounter  Medications   donepezil (ARICEPT) 10 MG tablet    Sig: Take 1 tablet (10 mg total) by mouth at bedtime.    Dispense:  90 tablet    Refill:  3    Return in about 1 year (around 02/06/2023).   I have spent a total of 45 minutes dedicated to this patient today, preparing to see patient, performing a medically  appropriate examination and evaluation, ordering tests and/or medications and procedures, and counseling and educating the patient/family/caregiver; independently interpreting result and communicating results to the family/patient/caregiver; and documenting clinical information in the electronic medical record.   Alric Ran, MD 02/05/2022, 12:17 PM  [facility] [address], [place], [place] [address] [phone]

## 2022-02-05 NOTE — Patient Instructions (Signed)
Continue with Aricept 10 mg nightly  Will obtain ATN profile  Follow up in a year     There are well-accepted and sensible ways to reduce risk for Alzheimers disease and other degenerative brain disorders .  Exercise Daily Walk A daily 20 minute walk should be part of your routine. Disease related apathy can be a significant roadblock to exercise and the only way to overcome this is to make it a daily routine and perhaps have a reward at the end (something your loved one loves to eat or drink perhaps) or a personal trainer coming to the home can also be very useful. Most importantly, the patient is much more likely to exercise if the caregiver / spouse does it with him/her. In general a structured, repetitive schedule is best.  General Health: Any diseases which effect your body will effect your brain such as a pneumonia, urinary infection, blood clot, heart attack or stroke. Keep contact with your primary care doctor for regular follow ups.  Sleep. A good nights sleep is healthy for the brain. Seven hours is recommended. If you have insomnia or poor sleep habits we can give you some instructions. If you have sleep apnea wear your mask.  Diet: Eating a heart healthy diet is also a good idea; fish and poultry instead of red meat, nuts (mostly non-peanuts), vegetables, fruits, olive oil or canola oil (instead of butter), minimal salt (use other spices to flavor foods), whole grain rice, bread, cereal and pasta and wine in moderation.Research is now showing that the MIND diet, which is a combination of The Mediterranean diet and the DASH diet, is beneficial for cognitive processing and longevity. Information about this diet can be found in The MIND Diet, a book by Doyne Keel, MS, RDN, and online at [employer]book[employer].si  Finances, Power of Attorney and Advance Directives: You should consider putting legal safeguards in place with regard to financial and medical decision making.  While the spouse always has power of attorney for medical and financial issues in the absence of any form, you should consider what you want in case the spouse / caregiver is no longer around or capable of making decisions.

## 2022-02-10 LAB — ATN PROFILE
A -- Beta-amyloid 42/40 Ratio: 0.105 (ref >0.102)
Beta-amyloid 40: 368.48 pg/mL
Beta-amyloid 42: 38.62 pg/mL
N -- NfL, Plasma: 13.7 pg/mL — ABNORMAL HIGH (ref 0.00–11.55)
T -- p-tau181: 3.69 pg/mL — ABNORMAL HIGH (ref 0.00–0.97)

## 2022-02-23 DIAGNOSIS — H52203 Unspecified astigmatism, bilateral: Secondary | ICD-10-CM | POA: Diagnosis not present

## 2022-02-23 DIAGNOSIS — H353132 Nonexudative age-related macular degeneration, bilateral, intermediate dry stage: Secondary | ICD-10-CM | POA: Diagnosis not present

## 2022-02-23 DIAGNOSIS — Z961 Presence of intraocular lens: Secondary | ICD-10-CM | POA: Diagnosis not present

## 2022-05-13 DIAGNOSIS — N184 Chronic kidney disease, stage 4 (severe): Secondary | ICD-10-CM | POA: Diagnosis not present

## 2022-05-13 DIAGNOSIS — I10 Essential (primary) hypertension: Secondary | ICD-10-CM | POA: Diagnosis not present

## 2022-05-13 DIAGNOSIS — F0280 Dementia in other diseases classified elsewhere without behavioral disturbance: Secondary | ICD-10-CM | POA: Diagnosis not present

## 2022-05-13 DIAGNOSIS — G300 Alzheimer's disease with early onset: Secondary | ICD-10-CM | POA: Diagnosis not present

## 2022-05-13 DIAGNOSIS — R413 Other amnesia: Secondary | ICD-10-CM | POA: Diagnosis not present

## 2022-05-13 DIAGNOSIS — I700 Atherosclerosis of aorta: Secondary | ICD-10-CM | POA: Diagnosis not present

## 2022-05-13 DIAGNOSIS — E785 Hyperlipidemia, unspecified: Secondary | ICD-10-CM | POA: Diagnosis not present

## 2022-05-13 DIAGNOSIS — R634 Abnormal weight loss: Secondary | ICD-10-CM | POA: Diagnosis not present

## 2022-05-13 DIAGNOSIS — R7303 Prediabetes: Secondary | ICD-10-CM | POA: Diagnosis not present

## 2022-05-18 DIAGNOSIS — D649 Anemia, unspecified: Secondary | ICD-10-CM | POA: Diagnosis not present

## 2022-05-22 DIAGNOSIS — R3914 Feeling of incomplete bladder emptying: Secondary | ICD-10-CM | POA: Diagnosis not present

## 2022-05-22 DIAGNOSIS — N130 Hydronephrosis with ureteropelvic junction obstruction: Secondary | ICD-10-CM | POA: Diagnosis not present

## 2022-05-25 DIAGNOSIS — E875 Hyperkalemia: Secondary | ICD-10-CM | POA: Diagnosis not present

## 2022-05-25 DIAGNOSIS — N184 Chronic kidney disease, stage 4 (severe): Secondary | ICD-10-CM | POA: Diagnosis not present

## 2022-05-25 DIAGNOSIS — D649 Anemia, unspecified: Secondary | ICD-10-CM | POA: Diagnosis not present

## 2022-05-25 DIAGNOSIS — N35919 Unspecified urethral stricture, male, unspecified site: Secondary | ICD-10-CM | POA: Diagnosis not present

## 2022-05-25 DIAGNOSIS — I129 Hypertensive chronic kidney disease with stage 1 through stage 4 chronic kidney disease, or unspecified chronic kidney disease: Secondary | ICD-10-CM | POA: Diagnosis not present

## 2022-05-25 DIAGNOSIS — N179 Acute kidney failure, unspecified: Secondary | ICD-10-CM | POA: Diagnosis not present

## 2022-05-26 DIAGNOSIS — R3914 Feeling of incomplete bladder emptying: Secondary | ICD-10-CM | POA: Diagnosis not present

## 2022-05-26 DIAGNOSIS — N130 Hydronephrosis with ureteropelvic junction obstruction: Secondary | ICD-10-CM | POA: Diagnosis not present

## 2022-06-01 ENCOUNTER — Ambulatory Visit: Payer: PPO | Admitting: Neurology

## 2022-06-16 DIAGNOSIS — N184 Chronic kidney disease, stage 4 (severe): Secondary | ICD-10-CM | POA: Diagnosis not present

## 2022-06-22 DIAGNOSIS — N35919 Unspecified urethral stricture, male, unspecified site: Secondary | ICD-10-CM | POA: Diagnosis not present

## 2022-06-22 DIAGNOSIS — N179 Acute kidney failure, unspecified: Secondary | ICD-10-CM | POA: Diagnosis not present

## 2022-06-22 DIAGNOSIS — I129 Hypertensive chronic kidney disease with stage 1 through stage 4 chronic kidney disease, or unspecified chronic kidney disease: Secondary | ICD-10-CM | POA: Diagnosis not present

## 2022-06-22 DIAGNOSIS — E875 Hyperkalemia: Secondary | ICD-10-CM | POA: Diagnosis not present

## 2022-06-22 DIAGNOSIS — D649 Anemia, unspecified: Secondary | ICD-10-CM | POA: Diagnosis not present

## 2022-06-22 DIAGNOSIS — N184 Chronic kidney disease, stage 4 (severe): Secondary | ICD-10-CM | POA: Diagnosis not present

## 2022-06-30 ENCOUNTER — Other Ambulatory Visit: Payer: Self-pay

## 2022-06-30 DIAGNOSIS — Z515 Encounter for palliative care: Secondary | ICD-10-CM

## 2022-07-01 NOTE — Progress Notes (Signed)
COMMUNITY PALLIATIVE CARE SW NOTE  PATIENT NAME: Jesus Kim DOB: 22-Jan-1924 MRN: [id number]  PRIMARY CARE PROVIDER: Farris Has, MD  RESPONSIBLE PARTY:  Acct ID - Guarantor Home Phone Work Phone Relationship Acct Type  [id number] TYRANE, ASKAR [phone]  Self P/F     [address] [place], [place] [address]   Initial Palliative Care Encounter/Clinical Social Work  [employer] SW completed an initial telephonic encounter with patient. SW provided education to patient regarding the palliative care referral, the services, visit frequency and how it differs from hospice. SW had to repeat statements to patient several times before he stated, "I need you to talk to my wife". He placed SW on hold and came back to take SW's name and number and advised that his wife will call SW back.  SW inquired how he was doing and he stated that he was doing okay. He stated he was having some shortness of breath due to trying to find his wife, but otherwise he was doing okay. SW advised patient to have his wife return call on tomorrow.  No other concerns noted.     Social History   Tobacco Use   Smoking status: Former    Types: Cigarettes    Quit date: 01/31/1977    Years since quitting: 45.4   Smokeless tobacco: Never  Substance Use Topics   Alcohol use: Yes    Alcohol/week: 2.0 standard drinks of alcohol    Types: 1 Glasses of wine, 1 Cans of beer per week    Comment: occasionally    CODE STATUS: To be assessed ADVANCED DIRECTIVES: No MOST FORM COMPLETE: No HOSPICE EDUCATION PROVIDED: Yes,   Duration of encounter and documentation:30 minutes  [employer], LCSW

## 2022-07-13 ENCOUNTER — Telehealth: Payer: Self-pay | Admitting: Neurology

## 2022-07-13 NOTE — Telephone Encounter (Signed)
Call back to [employer], spoke with Star-advised that Dr. Teresa Coombs is out of the office until 07/20/22 but we typically defer to the PCP to serve as patients attending. Dr. Farris Has number provided to Star. Start appreciative of return call.

## 2022-07-13 NOTE — Telephone Encounter (Signed)
Jesus Rochester, RN asking If Dr. Teresa Coombs could do order for hospice. Pt is in palliative care and has declined. Would like a call from the nurse.

## 2022-07-14 DIAGNOSIS — N185 Chronic kidney disease, stage 5: Secondary | ICD-10-CM | POA: Diagnosis not present

## 2022-07-14 DIAGNOSIS — R4189 Other symptoms and signs involving cognitive functions and awareness: Secondary | ICD-10-CM | POA: Diagnosis not present

## 2022-07-19 NOTE — Telephone Encounter (Signed)
Agree, they can follow up with PCP

## 2022-07-23 ENCOUNTER — Emergency Department (HOSPITAL_COMMUNITY)
Admission: EM | Admit: 2022-07-23 | Discharge: 2022-07-24 | Disposition: A | Discharge status: Home / Self Care | Payer: PPO | Attending: Emergency Medicine | Admitting: Emergency Medicine

## 2022-07-23 DIAGNOSIS — Z79899 Other long term (current) drug therapy: Secondary | ICD-10-CM | POA: Diagnosis not present

## 2022-07-23 DIAGNOSIS — S59911A Unspecified injury of right forearm, initial encounter: Secondary | ICD-10-CM | POA: Diagnosis present

## 2022-07-23 DIAGNOSIS — S51811A Laceration without foreign body of right forearm, initial encounter: Secondary | ICD-10-CM | POA: Diagnosis not present

## 2022-07-23 DIAGNOSIS — R011 Cardiac murmur, unspecified: Secondary | ICD-10-CM | POA: Insufficient documentation

## 2022-07-23 DIAGNOSIS — S0083XA Contusion of other part of head, initial encounter: Secondary | ICD-10-CM | POA: Diagnosis not present

## 2022-07-23 DIAGNOSIS — I120 Hypertensive chronic kidney disease with stage 5 chronic kidney disease or end stage renal disease: Secondary | ICD-10-CM | POA: Insufficient documentation

## 2022-07-23 DIAGNOSIS — W19XXXA Unspecified fall, initial encounter: Secondary | ICD-10-CM | POA: Insufficient documentation

## 2022-07-23 DIAGNOSIS — R0902 Hypoxemia: Secondary | ICD-10-CM | POA: Diagnosis not present

## 2022-07-23 DIAGNOSIS — S81811A Laceration without foreign body, right lower leg, initial encounter: Secondary | ICD-10-CM | POA: Insufficient documentation

## 2022-07-23 DIAGNOSIS — N185 Chronic kidney disease, stage 5: Secondary | ICD-10-CM | POA: Diagnosis not present

## 2022-07-23 DIAGNOSIS — N179 Acute kidney failure, unspecified: Secondary | ICD-10-CM | POA: Diagnosis not present

## 2022-07-23 DIAGNOSIS — R000 Tachycardia, unspecified: Secondary | ICD-10-CM | POA: Diagnosis not present

## 2022-07-23 DIAGNOSIS — I959 Hypotension, unspecified: Secondary | ICD-10-CM | POA: Diagnosis not present

## 2022-07-23 DIAGNOSIS — K429 Umbilical hernia without obstruction or gangrene: Secondary | ICD-10-CM | POA: Diagnosis not present

## 2022-07-23 DIAGNOSIS — I129 Hypertensive chronic kidney disease with stage 1 through stage 4 chronic kidney disease, or unspecified chronic kidney disease: Secondary | ICD-10-CM | POA: Diagnosis not present

## 2022-07-23 DIAGNOSIS — S51812A Laceration without foreign body of left forearm, initial encounter: Secondary | ICD-10-CM | POA: Insufficient documentation

## 2022-07-23 DIAGNOSIS — S8012XA Contusion of left lower leg, initial encounter: Secondary | ICD-10-CM | POA: Diagnosis not present

## 2022-07-23 DIAGNOSIS — Y92009 Unspecified place in unspecified non-institutional (private) residence as the place of occurrence of the external cause: Secondary | ICD-10-CM | POA: Diagnosis not present

## 2022-07-23 DIAGNOSIS — R0689 Other abnormalities of breathing: Secondary | ICD-10-CM | POA: Diagnosis not present

## 2022-07-23 DIAGNOSIS — N3001 Acute cystitis with hematuria: Secondary | ICD-10-CM | POA: Diagnosis not present

## 2022-07-23 DIAGNOSIS — N189 Chronic kidney disease, unspecified: Secondary | ICD-10-CM | POA: Diagnosis not present

## 2022-07-23 NOTE — ED Triage Notes (Signed)
Pt arrived via EMS for falls possible UTI, pt is on hospice for renal failure.  145 cbg 124/64 98RA 110 HR

## 2022-07-23 NOTE — ED Provider Notes (Addendum)
CBC with leukocytosis of 28,000 with toxic granulations  EMERGENCY DEPARTMENT AT [facility] Provider Note   CSN: [id number] Arrival date & time: 07/23/22  2150     History  Chief Complaint  Patient presents with   Marletta Lor    Jesus Kim is a 89 y.o. male who presents with his son-in-law at bedside with concern for multiple falls today at home. On hospice starting this week for renal failure, hx of recurrent UTIs. Reported fever of 100.3 F at home this morning, shivering, decreased appetite. No change in mental status.  Global weakness and mechanical falls occurring today on the way back from the bathroom x 3. Did hit posterior head on furniture with first fall, no LOC. Patient refused dialysis.    I have reviewed his medical records. Hx of HTN, hyperlipidemia, CKD. Ambulatory with walker at home. HPI     Home Medications Prior to Admission medications   Medication Sig Start Date End Date Taking? Authorizing Provider  cephALEXin (KEFLEX) 500 MG capsule Take 1 capsule (500 mg total) by mouth 2 (two) times daily for 10 days. 07/24/22 08/03/22 Yes Bekka Qian, Lupe Carney R, PA-C  amLODipine (NORVASC) 2.5 MG tablet Take 2.5 mg by mouth 2 (two) times daily.    [provider]  donepezil (ARICEPT) 10 MG tablet Take 1 tablet (10 mg total) by mouth at bedtime. 02/05/22   Windell Norfolk, MD  finasteride (PROSCAR) 5 MG tablet Take 5 mg by mouth in the morning.    [provider]  gabapentin (NEURONTIN) 100 MG capsule Take 2 capsules (200 mg total) by mouth at bedtime. Patient not taking: Reported on 08/27/2021 10/02/20   Rodolph Bong, MD  Multiple Vitamins-Minerals (PRESERVISION AREDS 2) CAPS Take 1 capsule by mouth in the morning and at bedtime. 07/21/21   [provider]  nitrofurantoin, macrocrystal-monohydrate, (MACROBID) 100 MG capsule Take 100 mg by mouth daily.    [provider]  pantoprazole (PROTONIX) 40 MG tablet Take 1 tablet (40 mg  total) by mouth 2 (two) times daily before a meal. Patient not taking: Reported on 08/27/2021 04/06/21   Noralee Stain, DO  simvastatin (ZOCOR) 20 MG tablet Take 20 mg by mouth every evening.    [provider]      Allergies    Sulfa antibiotics    Review of Systems   Review of Systems  Constitutional:  Positive for appetite change, chills, fatigue and fever.  Neurological:  Positive for weakness.    Physical Exam Updated Vital Signs BP 132/89 (BP Location: Right Arm)   Pulse (!) 102   Temp 98.4 F (36.9 C) (Oral)   Resp 14   SpO2 99%  Physical Exam Vitals and nursing note reviewed.  Constitutional:      Appearance: He is ill-appearing. He is not toxic-appearing.  HENT:     Head: Normocephalic and atraumatic. No laceration.      Nose: Nose normal.     Mouth/Throat:     Mouth: Mucous membranes are moist.     Pharynx: No oropharyngeal exudate or posterior oropharyngeal erythema.  Eyes:     General:        Right eye: No discharge.        Left eye: No discharge.     Conjunctiva/sclera: Conjunctivae normal.  Cardiovascular:     Rate and Rhythm: Normal rate and regular rhythm.     Pulses: Normal pulses.     Heart sounds: Murmur heard.     Systolic  murmur is present.  Pulmonary:     Effort: Pulmonary effort is normal. No tachypnea, bradypnea, accessory muscle usage, prolonged expiration or respiratory distress.     Breath sounds: Normal breath sounds. No wheezing or rales.  Chest:     Chest wall: No mass, lacerations, deformity, swelling, tenderness or crepitus.  Abdominal:     General: Bowel sounds are normal. There is no distension.     Palpations: Abdomen is soft.     Tenderness: There is no abdominal tenderness. There is no guarding or rebound.     Hernia: A hernia is present. Hernia is present in the umbilical area.  Musculoskeletal:        General: No deformity.     Cervical back: Neck supple.     Comments: FROM of all joints in the upper and lower  extremities bilaterally.  Symmetric radial and pedal pulses. Bruising on the anterior lower legs bilaterally, per family chronic.   Skin:    General: Skin is warm and dry.       Neurological:     Mental Status: He is alert. Mental status is at baseline.  Psychiatric:        Mood and Affect: Mood normal.     ED Results / Procedures / Treatments   Labs (all labs ordered are listed, but only abnormal results are displayed) Labs Reviewed  CBC WITH DIFFERENTIAL/PLATELET - Abnormal; Notable for the following components:      Result Value   WBC 28.5 (*)    RBC 2.54 (*)    Hemoglobin 7.7 (*)    HCT 25.5 (*)    MCV 100.4 (*)    Neutro Abs 26.3 (*)    Lymphs Abs 0.4 (*)    Monocytes Absolute 1.4 (*)    Abs Immature Granulocytes 0.28 (*)    All other components within normal limits  COMPREHENSIVE METABOLIC PANEL - Abnormal; Notable for the following components:   Sodium 126 (*)    Potassium 7.1 (*)    CO2 11 (*)    Glucose, Bld 128 (*)    BUN 142 (*)    Creatinine, Ser 9.34 (*)    Calcium 8.1 (*)    Albumin 3.1 (*)    AST 11 (*)    GFR, Estimated 5 (*)    All other components within normal limits  URINALYSIS, ROUTINE W REFLEX MICROSCOPIC - Abnormal; Notable for the following components:   APPearance TURBID (*)    Hgb urine dipstick SMALL (*)    Protein, ur 100 (*)    Leukocytes,Ua LARGE (*)    Bacteria, UA MANY (*)    All other components within normal limits    EKG None  Radiology No results found.  Procedures .Critical Care  Performed by: Paris Lore, PA-C Authorized by: Paris Lore, PA-C   Critical care provider statement:    Critical care time (minutes):  45   Critical care was time spent personally by me on the following activities:  Development of treatment plan with patient or surrogate, discussions with consultants, evaluation of patient's response to treatment, examination of patient, obtaining history from patient or surrogate, ordering  and performing treatments and interventions, ordering and review of laboratory studies, ordering and review of radiographic studies, pulse oximetry and re-evaluation of patient's condition     Medications Ordered in ED Medications  cefTRIAXone (ROCEPHIN) 1 g in sodium chloride 0.9 % 100 mL IVPB (1 g Intravenous New Bag/Given 07/24/22 0127)    ED Course/  Medical Decision Making/ A&P Clinical Course as of 07/24/22 1610  Fri Jul 24, 2022  0037 Critical potassium 7.1.  [RS]  0107 Case discussed with the hospice [facility] care RN Jesus Kim who states that patient is new to their service this weekend had refused initial visit as he is moving residences tomorrow.  He is not a candidate for end-of-life bed, and may be on antibiotics for UTI under their service but cannot be actively receiving treatment for renal failure.  Appreciate her collaboration in the care of this patient. [RS]    Clinical Course User Index [RS] Jesus Kim, Jesus Gavia, PA-C                             Medical Decision Making 89 year old male who presents with his son at the bedside patient is a hospice patient, presents for multiple falls today.  Tachycardic in the 110s, normal BP 109/53.  Patient admitted status baseline ANO x 4, cardiopulmonary exam reassuring abdominal exam is benign, small local hernia.  Some bruising to the anterior lower extremities and skin tears to the right lower extremity and bilateral forearms, hemostatic at this time.  No deformities.  Patient moving all extremity spontaneously without difficulty.  Bruising and swelling noted to the right parietal scalp without step-off or crepitus. Not sure decision-making with the patient and his son-in-law regarding degree of investigation to be pursued in the emergency department, patient is currently on hospice.  After discussion with the patient we will proceed with laboratory studies and urine studies but will hold off on any imaging at this time.  Amount  and/or Complexity of Data Reviewed Labs: ordered.    Details: CBC with leukocytosis of 28,000 and toxic granulations, and newly worsening anemia with hemoglobin of 7.  CMP with critical hyperkalemia of 7.1 in context of acute on chronic renal failure with creatinine of 9.  UA concerning for infection.   Risk Prescription drug management.    Rocephin administered in the ED, disposition discussed with patient and family as well as hospice nurse as above.  Will plan to discharge patient home, hospice to see him at his residence today, renally dosed antibiotics prescribed to his pharmacy.  No indication for further workup in the emergency department this time.  Patient and end-stage renal failure on hospice care, will not benefit from admission the hospital given hospice status and patient who is pain-free at this time.  Can receive supportive care at home and prefers to be discharged to his own home at this time.  Jesus Kim and his family  voiced understanding of her medical evaluation and treatment plan. Each of their questions answered to their expressed satisfaction.  Return precautions were given.  Patient is well-appearing, stable, and was discharged in good condition.  This chart was dictated using voice recognition software, Dragon. Despite the best efforts of this provider to proofread and correct errors, errors may still occur which can change documentation meaning.   Final Clinical Impression(s) / ED Diagnoses Final diagnoses:  Acute cystitis with hematuria  Acute renal failure superimposed on stage 5 chronic kidney disease, not on chronic dialysis, unspecified acute renal failure type (HCC)    Rx / DC Orders ED Discharge Orders          Ordered    cephALEXin (KEFLEX) 500 MG capsule  2 times daily        07/24/22 0203  Paris Lore, PA-C 07/24/22 0208    Jeanann Balinski, Jesus Gavia, PA-C 07/24/22 1610    Ernie Avena, MD 07/24/22 [phone]

## 2022-07-24 DIAGNOSIS — Z7401 Bed confinement status: Secondary | ICD-10-CM | POA: Diagnosis not present

## 2022-07-24 DIAGNOSIS — G459 Transient cerebral ischemic attack, unspecified: Secondary | ICD-10-CM | POA: Diagnosis not present

## 2022-07-24 DIAGNOSIS — W19XXXA Unspecified fall, initial encounter: Secondary | ICD-10-CM | POA: Diagnosis not present

## 2022-07-24 LAB — URINALYSIS, ROUTINE W REFLEX MICROSCOPIC
Bilirubin Urine: NEGATIVE
Glucose, UA: NEGATIVE mg/dL
Ketones, ur: NEGATIVE mg/dL
Nitrite: NEGATIVE
Protein, ur: 100 mg/dL — AB
Specific Gravity, Urine: 1.009 (ref 1.005–1.030)
WBC, UA: >50 WBC/hpf (ref 0–5)
pH: 5 (ref 5.0–8.0)

## 2022-07-24 LAB — COMPREHENSIVE METABOLIC PANEL
ALT: 15 U/L (ref 0–44)
AST: 11 U/L — ABNORMAL LOW (ref 15–41)
Albumin: 3.1 g/dL — ABNORMAL LOW (ref 3.5–5.0)
Alkaline Phosphatase: 63 U/L (ref 38–126)
Anion gap: 12 (ref 5–15)
BUN: 142 mg/dL — ABNORMAL HIGH (ref 8–23)
CO2: 11 mmol/L — ABNORMAL LOW (ref 22–32)
Calcium: 8.1 mg/dL — ABNORMAL LOW (ref 8.9–10.3)
Chloride: 103 mmol/L (ref 98–111)
Creatinine, Ser: 9.34 mg/dL — ABNORMAL HIGH (ref 0.61–1.24)
GFR, Estimated: 5 mL/min — ABNORMAL LOW (ref >60)
Glucose, Bld: 128 mg/dL — ABNORMAL HIGH (ref 70–99)
Potassium: 7.1 mmol/L (ref 3.5–5.1)
Sodium: 126 mmol/L — ABNORMAL LOW (ref 135–145)
Total Bilirubin: 0.5 mg/dL (ref 0.3–1.2)
Total Protein: 7.1 g/dL (ref 6.5–8.1)

## 2022-07-24 LAB — CBC WITH DIFFERENTIAL/PLATELET
Abs Immature Granulocytes: 0.28 10*3/uL — ABNORMAL HIGH (ref 0.00–0.07)
Basophils Absolute: 0.1 10*3/uL (ref 0.0–0.1)
Basophils Relative: 0 %
Eosinophils Absolute: 0 10*3/uL (ref 0.0–0.5)
Eosinophils Relative: 0 %
HCT: 25.5 % — ABNORMAL LOW (ref 39.0–52.0)
Hemoglobin: 7.7 g/dL — ABNORMAL LOW (ref 13.0–17.0)
Immature Granulocytes: 1 %
Lymphocytes Relative: 1 %
Lymphs Abs: 0.4 10*3/uL — ABNORMAL LOW (ref 0.7–4.0)
MCH: 30.3 pg (ref 26.0–34.0)
MCHC: 30.2 g/dL (ref 30.0–36.0)
MCV: 100.4 fL — ABNORMAL HIGH (ref 80.0–100.0)
Monocytes Absolute: 1.4 10*3/uL — ABNORMAL HIGH (ref 0.1–1.0)
Monocytes Relative: 5 %
Neutro Abs: 26.3 10*3/uL — ABNORMAL HIGH (ref 1.7–7.7)
Neutrophils Relative %: 93 %
Platelets: 284 10*3/uL (ref 150–400)
RBC: 2.54 MIL/uL — ABNORMAL LOW (ref 4.22–5.81)
RDW: 14.4 % (ref 11.5–15.5)
WBC: 28.5 10*3/uL — ABNORMAL HIGH (ref 4.0–10.5)
nRBC: 0 % (ref 0.0–0.2)

## 2022-07-24 MED ORDER — SODIUM CHLORIDE 0.9 % IV SOLN
1.0000 g | Freq: Once | INTRAVENOUS | Status: AC
  Administered 2022-07-24: 1 g via INTRAVENOUS
  Filled 2022-07-24: qty 10

## 2022-07-24 MED ORDER — CEPHALEXIN 500 MG PO CAPS: 500.0000 mg | ORAL_CAPSULE | Freq: Two times a day (BID) | ORAL | 0 refills | Status: AC

## 2022-07-24 NOTE — Discharge Instructions (Signed)
Jesus Kim has kidney failure as previously diagnosed, he also has a urinary tract infection today for which he has been prescribed antibiotics.  May take the prescribed antibiotics for the entire course.  Hospice nurse will follow-up with you today.

## 2022-08-13 DEATH — deceased

## 2022-08-17 ENCOUNTER — Telehealth: Payer: Self-pay | Admitting: Neurology

## 2022-08-17 NOTE — Telephone Encounter (Signed)
Daughter called to report that on 08/06/2022 pt passed

## 2023-02-11 ENCOUNTER — Ambulatory Visit: Payer: PPO | Admitting: Neurology
